# Patient Record
Sex: Female | Born: 1955 | Race: White | Hispanic: No | Marital: Married | Smoking: Never smoker
Health system: Southern US, Community
[De-identification: ages and names within clinical notes are randomized; demographics above are authoritative.]

## PROBLEM LIST (undated history)

## (undated) DIAGNOSIS — F419 Anxiety disorder, unspecified: Secondary | ICD-10-CM

## (undated) DIAGNOSIS — I10 Essential (primary) hypertension: Secondary | ICD-10-CM

## (undated) DIAGNOSIS — C50919 Malignant neoplasm of unspecified site of unspecified female breast: Secondary | ICD-10-CM

## (undated) DIAGNOSIS — F32A Depression, unspecified: Secondary | ICD-10-CM

## (undated) DIAGNOSIS — M069 Rheumatoid arthritis, unspecified: Secondary | ICD-10-CM

## (undated) DIAGNOSIS — Z923 Personal history of irradiation: Secondary | ICD-10-CM

## (undated) HISTORY — DX: Personal history of irradiation: Z92.3

## (undated) HISTORY — DX: Depression, unspecified: F32.A

## (undated) HISTORY — PX: HAND SURGERY: SHX662

## (undated) HISTORY — PX: LASIK: SHX215

## (undated) HISTORY — DX: Malignant neoplasm of unspecified site of unspecified female breast: C50.919

## (undated) HISTORY — PX: CATARACT EXTRACTION, BILATERAL: SHX1313

## (undated) HISTORY — DX: Rheumatoid arthritis, unspecified: M06.9

## (undated) HISTORY — PX: GASTRIC BYPASS: SHX52

---

## 2021-09-25 ENCOUNTER — Telehealth: Payer: Self-pay | Admitting: Family Medicine

## 2021-09-25 NOTE — Telephone Encounter (Signed)
Pt called stating that they had been recommended by Audrea Muscat and Loraine Leriche to see about becoming a pt of Dr. Nani Ravens. Pt was advised that approval would have to be submitted in order to approve establishment of care. Pt acknowledged understanding.   Is establishment of care ok for this pt?

## 2021-10-15 ENCOUNTER — Observation Stay (HOSPITAL_BASED_OUTPATIENT_CLINIC_OR_DEPARTMENT_OTHER)
Admission: EM | Admit: 2021-10-15 | Discharge: 2021-10-17 | Disposition: A | Discharge status: Home / Self Care | Payer: Medicare HMO | Attending: Surgery | Admitting: Surgery

## 2021-10-15 ENCOUNTER — Emergency Department (HOSPITAL_BASED_OUTPATIENT_CLINIC_OR_DEPARTMENT_OTHER): Payer: Medicare HMO

## 2021-10-15 ENCOUNTER — Other Ambulatory Visit: Payer: Self-pay

## 2021-10-15 ENCOUNTER — Encounter (HOSPITAL_BASED_OUTPATIENT_CLINIC_OR_DEPARTMENT_OTHER): Payer: Self-pay | Admitting: Emergency Medicine

## 2021-10-15 DIAGNOSIS — J479 Bronchiectasis, uncomplicated: Secondary | ICD-10-CM | POA: Diagnosis not present

## 2021-10-15 DIAGNOSIS — J90 Pleural effusion, not elsewhere classified: Secondary | ICD-10-CM | POA: Diagnosis not present

## 2021-10-15 DIAGNOSIS — J939 Pneumothorax, unspecified: Secondary | ICD-10-CM | POA: Diagnosis not present

## 2021-10-15 DIAGNOSIS — I10 Essential (primary) hypertension: Secondary | ICD-10-CM | POA: Diagnosis not present

## 2021-10-15 DIAGNOSIS — S2249XA Multiple fractures of ribs, unspecified side, initial encounter for closed fracture: Secondary | ICD-10-CM | POA: Diagnosis present

## 2021-10-15 DIAGNOSIS — J984 Other disorders of lung: Secondary | ICD-10-CM | POA: Diagnosis not present

## 2021-10-15 DIAGNOSIS — S2241XA Multiple fractures of ribs, right side, initial encounter for closed fracture: Principal | ICD-10-CM | POA: Insufficient documentation

## 2021-10-15 DIAGNOSIS — S27321A Contusion of lung, unilateral, initial encounter: Secondary | ICD-10-CM | POA: Diagnosis not present

## 2021-10-15 DIAGNOSIS — S22028A Other fracture of second thoracic vertebra, initial encounter for closed fracture: Secondary | ICD-10-CM | POA: Insufficient documentation

## 2021-10-15 DIAGNOSIS — W109XXA Fall (on) (from) unspecified stairs and steps, initial encounter: Secondary | ICD-10-CM | POA: Diagnosis not present

## 2021-10-15 DIAGNOSIS — I700 Atherosclerosis of aorta: Secondary | ICD-10-CM | POA: Diagnosis not present

## 2021-10-15 DIAGNOSIS — S299XXA Unspecified injury of thorax, initial encounter: Secondary | ICD-10-CM | POA: Diagnosis present

## 2021-10-15 HISTORY — DX: Essential (primary) hypertension: I10

## 2021-10-15 LAB — CBC WITH DIFFERENTIAL/PLATELET
Abs Immature Granulocytes: 0.04 10*3/uL (ref 0.00–0.07)
Basophils Absolute: 0 10*3/uL (ref 0.0–0.1)
Basophils Relative: 0 %
Eosinophils Absolute: 0 10*3/uL (ref 0.0–0.5)
Eosinophils Relative: 0 %
HCT: 42.8 % (ref 36.0–46.0)
Hemoglobin: 13.9 g/dL (ref 12.0–15.0)
Immature Granulocytes: 0 %
Lymphocytes Relative: 6 %
Lymphs Abs: 0.6 10*3/uL — ABNORMAL LOW (ref 0.7–4.0)
MCH: 27.6 pg (ref 26.0–34.0)
MCHC: 32.5 g/dL (ref 30.0–36.0)
MCV: 85.1 fL (ref 80.0–100.0)
Monocytes Absolute: 0.6 10*3/uL (ref 0.1–1.0)
Monocytes Relative: 6 %
Neutro Abs: 8.8 10*3/uL — ABNORMAL HIGH (ref 1.7–7.7)
Neutrophils Relative %: 88 %
Platelets: 311 10*3/uL (ref 150–400)
RBC: 5.03 MIL/uL (ref 3.87–5.11)
RDW: 15.9 % — ABNORMAL HIGH (ref 11.5–15.5)
WBC: 10.1 10*3/uL (ref 4.0–10.5)
nRBC: 0 % (ref 0.0–0.2)

## 2021-10-15 LAB — BASIC METABOLIC PANEL
Anion gap: 11 (ref 5–15)
BUN: 12 mg/dL (ref 8–23)
CO2: 21 mmol/L — ABNORMAL LOW (ref 22–32)
Calcium: 10.2 mg/dL (ref 8.9–10.3)
Chloride: 104 mmol/L (ref 98–111)
Creatinine, Ser: 0.58 mg/dL (ref 0.44–1.00)
GFR, Estimated: >60 mL/min (ref >60)
Glucose, Bld: 125 mg/dL — ABNORMAL HIGH (ref 70–99)
Potassium: 4.7 mmol/L (ref 3.5–5.1)
Sodium: 136 mmol/L (ref 135–145)

## 2021-10-15 MED ORDER — MELATONIN 3 MG PO TABS
3.0000 mg | ORAL_TABLET | Freq: Every evening | ORAL | Status: DC | PRN
  Filled 2021-10-15: qty 1

## 2021-10-15 MED ORDER — ENOXAPARIN SODIUM 30 MG/0.3ML IJ SOSY
30.0000 mg | PREFILLED_SYRINGE | Freq: Two times a day (BID) | INTRAMUSCULAR | Status: DC
  Administered 2021-10-16 – 2021-10-17 (×3): 30 mg via SUBCUTANEOUS
  Filled 2021-10-15 (×3): qty 0.3

## 2021-10-15 MED ORDER — HYDRALAZINE HCL 20 MG/ML IJ SOLN: 10.0000 mg | INTRAMUSCULAR | Status: DC | PRN

## 2021-10-15 MED ORDER — OXYCODONE HCL 5 MG PO TABS
5.0000 mg | ORAL_TABLET | ORAL | Status: DC | PRN
  Administered 2021-10-15 (×2): 10 mg via ORAL
  Administered 2021-10-16: 5 mg via ORAL
  Administered 2021-10-16: 10 mg via ORAL
  Administered 2021-10-16: 5 mg via ORAL
  Filled 2021-10-15 (×2): qty 2
  Filled 2021-10-15: qty 1
  Filled 2021-10-15 (×2): qty 2

## 2021-10-15 MED ORDER — SODIUM CHLORIDE 0.9 % IV SOLN: INTRAVENOUS | Status: DC

## 2021-10-15 MED ORDER — ONDANSETRON HCL 4 MG/2ML IJ SOLN: 4.0000 mg | Freq: Four times a day (QID) | INTRAMUSCULAR | Status: DC | PRN

## 2021-10-15 MED ORDER — DOCUSATE SODIUM 100 MG PO CAPS
100.0000 mg | ORAL_CAPSULE | Freq: Two times a day (BID) | ORAL | Status: DC
  Administered 2021-10-15 – 2021-10-17 (×4): 100 mg via ORAL
  Filled 2021-10-15 (×4): qty 1

## 2021-10-15 MED ORDER — ONDANSETRON 4 MG PO TBDP: 4.0000 mg | ORAL_TABLET | Freq: Four times a day (QID) | ORAL | Status: DC | PRN

## 2021-10-15 MED ORDER — ADULT MULTIVITAMIN W/MINERALS CH
1.0000 | ORAL_TABLET | Freq: Every day | ORAL | Status: DC
  Administered 2021-10-16 – 2021-10-17 (×2): 1 via ORAL
  Filled 2021-10-15 (×2): qty 1

## 2021-10-15 MED ORDER — ACETAMINOPHEN 325 MG PO TABS: 650.0000 mg | ORAL_TABLET | ORAL | Status: DC | PRN

## 2021-10-15 MED ORDER — HYDROCODONE-ACETAMINOPHEN 7.5-325 MG/15ML PO SOLN
10.0000 mL | Freq: Once | ORAL | Status: AC
  Administered 2021-10-15: 10 mL via ORAL
  Filled 2021-10-15: qty 15

## 2021-10-15 MED ORDER — MORPHINE SULFATE (PF) 2 MG/ML IV SOLN
2.0000 mg | INTRAVENOUS | Status: DC | PRN
  Administered 2021-10-15 (×3): 2 mg via INTRAVENOUS
  Filled 2021-10-15 (×3): qty 1

## 2021-10-15 NOTE — ED Notes (Signed)
States she feels better after PO pain med, but still rates pain a 7 on 0-10 scale, safety measures in place, family at bedside

## 2021-10-15 NOTE — ED Notes (Signed)
Returns from radiology, comfort measures provided, pt positioned for comfort, warm blanket to pain area also provided. Safety measures in place

## 2021-10-15 NOTE — ED Provider Notes (Addendum)
[facility] Provider Note   CSN: [id number] Arrival date & time: 10/15/21  0901     History  Chief Complaint  Patient presents with   Linda Wood is a 66 y.o. female.  Presented to emergency room due to concern for fall.  Patient reports that last night around 2 AM she fell.  She had recently walked down steps and fell, denies passing out, denies hitting her head.  Struck the right side of her chest.  Is having significant pain on the right side of her ribs, worse with deep inspiration.  Does feel somewhat short of breath.  No alleviating or aggravating factors otherwise.  She denies drinking alcohol on a daily basis.  Not on blood thinners.  HPI     Home Medications Prior to Admission medications   Not on File      Allergies    Patient has no allergy information on record.    Review of Systems   Review of Systems  Constitutional:  Negative for chills and fever.  HENT:  Negative for ear pain and sore throat.   Eyes:  Negative for pain and visual disturbance.  Respiratory:  Positive for chest tightness and shortness of breath. Negative for cough.   Cardiovascular:  Positive for chest pain. Negative for palpitations.  Gastrointestinal:  Negative for abdominal pain and vomiting.  Genitourinary:  Negative for dysuria and hematuria.  Musculoskeletal:  Negative for arthralgias and back pain.  Skin:  Negative for color change and rash.  Neurological:  Negative for seizures and syncope.  All other systems reviewed and are negative.   Physical Exam Updated Vital Signs BP (!) 159/92 (BP Location: Right Arm)   Pulse 78   Temp 97.6 F (36.4 C) (Oral)   Resp 20   Ht '5\' 2"'$  (1.575 m)   Wt 70.3 kg   SpO2 98%   BMI 28.35 kg/m  Physical Exam Vitals and nursing note reviewed.  Constitutional:      General: She is not in acute distress.    Appearance: She is well-developed.  HENT:     Head: Normocephalic and atraumatic.  Eyes:      Conjunctiva/sclera: Conjunctivae normal.  Cardiovascular:     Rate and Rhythm: Normal rate and regular rhythm.     Heart sounds: No murmur heard. Pulmonary:     Comments: Breath sounds are clear, patient does have mild tachypnea and slight increased work of breathing Chest:     Comments: Tenderness to palpation over right lateral and right posterior chest wall Abdominal:     Palpations: Abdomen is soft.     Tenderness: There is no abdominal tenderness.  Musculoskeletal:        General: No swelling.     Cervical back: Neck supple.     Comments: Back: no C, T, L spine TTP, no step off or deformity RUE: no TTP throughout, no deformity, normal joint ROM, radial pulse intact, distal sensation and motor intact LUE: no TTP throughout, no deformity, normal joint ROM, radial pulse intact, distal sensation and motor intact RLE:  no TTP throughout, no deformity, normal joint ROM, distal pulse, sensation and motor intact LLE: no TTP throughout, no deformity, normal joint ROM, distal pulse, sensation and motor intact  Skin:    General: Skin is warm and dry.     Capillary Refill: Capillary refill takes less than 2 seconds.  Neurological:     Mental Status: She is alert.  Psychiatric:  Mood and Affect: Mood normal.     ED Results / Procedures / Treatments   Labs (all labs ordered are listed, but only abnormal results are displayed) Labs Reviewed  CBC WITH DIFFERENTIAL/PLATELET - Abnormal; Notable for the following components:      Result Value   RDW 15.9 (*)    Neutro Abs 8.8 (*)    Lymphs Abs 0.6 (*)    All other components within normal limits  BASIC METABOLIC PANEL    EKG None  Radiology CT Chest Wo Contrast  Result Date: 10/15/2021 CLINICAL DATA:  Rib fracture suspected.  Patient fell today. EXAM: CT CHEST WITHOUT CONTRAST TECHNIQUE: Multidetector CT imaging of the chest was performed following the standard protocol without IV contrast. RADIATION DOSE REDUCTION: This exam  was performed according to the departmental dose-optimization program which includes automated exposure control, adjustment of the mA and/or kV according to patient size and/or use of iterative reconstruction technique. COMPARISON:  10/15/2021 FINDINGS: Cardiovascular: Heart size is normal. There is atherosclerotic calcification of the thoracic aorta, not associated with aneurysm. Coronary artery calcifications are present. Noncontrast appearance of the pulmonary artery is normal. Mediastinum/Nodes: Small hiatal hernia is present. The visualized portion of the thyroid gland has a normal appearance. No significant mediastinal, hilar, or axillary adenopathy. Lungs/Pleura: Trace pneumothorax on the RIGHT. There are infiltrates in the RIGHT LOWER lobe and RIGHT UPPER lobe, in a peribronchovascular distribution. Focal areas of bronchiectasis in the LOWER lobes bilaterally. Predominantly in the LOWER lobes. There is perihilar peribronchial thickening. 1 Upper Abdomen: The gallbladder is present. Postoperative changes in the gastroesophageal region. No acute abnormality. Musculoskeletal: There are acute fractures of RIGHT ribs 3 through 8. Many of these fractures are segmental. With significant chest wall deformity, associated pleural thickening, and trace pneumothorax. LEFT ribs are unremarkable. Sternum is intact. There is a superior endplate fracture of T2, of indeterminate age. No retropulsion of fracture fragments. Clavicles are intact. IMPRESSION: 1. Small RIGHT pneumothorax associated with acute fractures of the RIGHT LATERAL ribs 3 through 8. Many of these rib fractures are segmental. 2. Superior endplate fracture of T2, of indeterminate age not associated retropulsion. 3. Pulmonary infiltrates in the RIGHT UPPER lobe and RIGHT LOWER lobe, consistent with infectious process or contusion. Infectious process is favored. 4. Small RIGHT pleural effusion. 5.  Aortic atherosclerosis.  (ICD10-I70.0) 6. Coronary artery  calcifications. 7. Small hiatal hernia. 8. Postoperative changes in the gastroesophageal junction. Electronically Signed   By: Nolon Nations M.D.   On: 10/15/2021 10:41   DG Ribs Unilateral W/Chest Right  Result Date: 10/15/2021 CLINICAL DATA:  Right rib pain EXAM: RIGHT RIBS AND CHEST - 3+ VIEW COMPARISON:  None Available. FINDINGS: Multiple displaced right posterolateral rib fractures. No definite pneumothorax. Patchy density in the right lung may reflect atelectasis and/or contusion. Left lung is clear. Cardiomediastinal contours are within normal limits. IMPRESSION: Multiple displaced right posterolateral rib fractures. No definite pneumothorax. Patchy right lung atelectasis and/or contusion. Electronically Signed   By: Macy Mis M.D.   On: 10/15/2021 10:01    Procedures .Critical Care  Performed by: Lucrezia Starch, MD Authorized by: Lucrezia Starch, MD   Critical care provider statement:    Critical care time (minutes):  37   Critical care was necessary to treat or prevent imminent or life-threatening deterioration of the following conditions:  Respiratory failure and trauma   Critical care was time spent personally by me on the following activities:  Development of treatment plan with patient or surrogate,  discussions with consultants, evaluation of patient's response to treatment, examination of patient, ordering and review of laboratory studies, ordering and review of radiographic studies, ordering and performing treatments and interventions, pulse oximetry, re-evaluation of patient's condition and review of old charts     Medications Ordered in ED Medications  HYDROcodone-acetaminophen (HYCET) 7.5-325 mg/15 ml solution 10 mL (10 mLs Oral Given 10/15/21 5397)    ED Course/ Medical Decision Making/ A&P                           Medical Decision Making Amount and/or Complexity of Data Reviewed Labs: ordered. Radiology: ordered.  Risk Prescription drug  management. Decision regarding hospitalization.   66 year old lady presenting to ER after reported mechanical fall and right rib pain.  On physical exam she appears well but did have some tachypnea on initial exam, significant tenderness to right chest wall.   Patient denied head trauma, LOC, neck pain, low back pain, abdominal pain, extremity injury.  X-ray chest was independently reviewed and interpreted by myself, multiple displaced rib fractures noted.  Check CT chest to further evaluate. Independently reviewed and interpreted CT. Right 3 through 8 rib fractures noted, very small pneumothorax.  Radiologist commented on pulmonary infiltrate concerning for contusion versus infectious process.  Patient does not have cough, fever, leukocytosis, given recent trauma I have a much stronger clinical suspicion for contusion and will hold on starting antibiotics for now.  Will consult trauma surgery to request admission for further management.  I provided pain medication.   Discussed with Bobbye Morton - she will review chart/imaging and get back to me.          Final Clinical Impression(s) / ED Diagnoses Final diagnoses:  Closed fracture of multiple ribs of right side, initial encounter  Pneumothorax on right  Contusion of right lung, initial encounter    Rx / DC Orders ED Discharge Orders     None         Lucrezia Starch, MD 10/15/21 1058    Lucrezia Starch, MD 10/15/21 1105

## 2021-10-15 NOTE — ED Notes (Signed)
Pt endorses pain with deep inspiration

## 2021-10-15 NOTE — Progress Notes (Signed)
RT note- Incentive spirometer instructed. 333m best effort, reporting pain level of 8, splinting, xray pending.

## 2021-10-15 NOTE — Progress Notes (Signed)
RT note-Oxygen started at 2L min for sp02 <92% per MD

## 2021-10-15 NOTE — ED Notes (Addendum)
Attempted to call report to [facility] assigned bed. RN unable to take report at this time. RN sent secure chat report to IP RN.

## 2021-10-15 NOTE — ED Notes (Signed)
Ont to be HIGH FALL RISK client secondary to opioid administration for pain management, safety measures remain in place, again pt reminded to call for any assistance and not to get off stretcher without staff in room . Sr x 2 up, call bell within easy reach. Cont to await for room assignment at [facility]

## 2021-10-15 NOTE — ED Notes (Signed)
Denies hitting head, no LOC, GCS 15 upon arrival to ED

## 2021-10-15 NOTE — ED Notes (Addendum)
States she fell this am onto hardwood floor, rubbing area ar RUQ, no bruising or discoloration of area is noted at this time. Very tender to palpation. Pt was medicated per EDP orders, RT at bedside to instruct on IS device. Resp with slight tachypnea is noted. BS clear bilaterally

## 2021-10-15 NOTE — ED Notes (Signed)
Medicated per MD orders for pain (morphine '2mg'$  IV), given slow IV push, safety measures in place, call bell within easy reach, bed in lowest position, pt now a HIGH FALL RISK client) Placed on 5 lead cardiac monitoring as well

## 2021-10-15 NOTE — ED Triage Notes (Signed)
Pt arrives pov, to triage in wheelchair, c/o mechanical fall, etoh was involved. Pt reports Right side rib pain. Denies loc, or thinners, denies head injury

## 2021-10-15 NOTE — ED Notes (Signed)
Ambulated to restroom with min assistance, noted to have DOE upon return to exam room, also pain has increased to 8 on a 0-10 scale, pain med orders reviewed and pain med administered per PRN orders

## 2021-10-15 NOTE — ED Notes (Signed)
Patient transported to CT 

## 2021-10-16 ENCOUNTER — Observation Stay (HOSPITAL_COMMUNITY): Payer: Medicare HMO

## 2021-10-16 DIAGNOSIS — J939 Pneumothorax, unspecified: Secondary | ICD-10-CM | POA: Diagnosis not present

## 2021-10-16 DIAGNOSIS — S270XXA Traumatic pneumothorax, initial encounter: Secondary | ICD-10-CM | POA: Diagnosis not present

## 2021-10-16 DIAGNOSIS — J9811 Atelectasis: Secondary | ICD-10-CM | POA: Diagnosis not present

## 2021-10-16 DIAGNOSIS — S2241XA Multiple fractures of ribs, right side, initial encounter for closed fracture: Secondary | ICD-10-CM | POA: Diagnosis not present

## 2021-10-16 DIAGNOSIS — I10 Essential (primary) hypertension: Secondary | ICD-10-CM | POA: Diagnosis not present

## 2021-10-16 LAB — CBC
HCT: 38.2 % (ref 36.0–46.0)
Hemoglobin: 12.2 g/dL (ref 12.0–15.0)
MCH: 27.3 pg (ref 26.0–34.0)
MCHC: 31.9 g/dL (ref 30.0–36.0)
MCV: 85.5 fL (ref 80.0–100.0)
Platelets: 268 10*3/uL (ref 150–400)
RBC: 4.47 MIL/uL (ref 3.87–5.11)
RDW: 15.9 % — ABNORMAL HIGH (ref 11.5–15.5)
WBC: 7.9 10*3/uL (ref 4.0–10.5)
nRBC: 0 % (ref 0.0–0.2)

## 2021-10-16 LAB — HIV ANTIBODY (ROUTINE TESTING W REFLEX): HIV Screen 4th Generation wRfx: NONREACTIVE

## 2021-10-16 LAB — BASIC METABOLIC PANEL
Anion gap: 7 (ref 5–15)
BUN: 7 mg/dL — ABNORMAL LOW (ref 8–23)
CO2: 25 mmol/L (ref 22–32)
Calcium: 9.7 mg/dL (ref 8.9–10.3)
Chloride: 104 mmol/L (ref 98–111)
Creatinine, Ser: 0.48 mg/dL (ref 0.44–1.00)
GFR, Estimated: >60 mL/min (ref >60)
Glucose, Bld: 103 mg/dL — ABNORMAL HIGH (ref 70–99)
Potassium: 4 mmol/L (ref 3.5–5.1)
Sodium: 136 mmol/L (ref 135–145)

## 2021-10-16 MED ORDER — SODIUM CHLORIDE 0.9% FLUSH
3.0000 mL | Freq: Two times a day (BID) | INTRAVENOUS | Status: DC
  Administered 2021-10-16 – 2021-10-17 (×3): 3 mL via INTRAVENOUS

## 2021-10-16 MED ORDER — LOSARTAN POTASSIUM 50 MG PO TABS: 50.0000 mg | ORAL_TABLET | Freq: Every morning | ORAL | Status: DC

## 2021-10-16 MED ORDER — LIDOCAINE 5 % EX PTCH
1.0000 | MEDICATED_PATCH | CUTANEOUS | Status: DC
  Administered 2021-10-16 – 2021-10-17 (×2): 1 via TRANSDERMAL
  Filled 2021-10-16 (×2): qty 1

## 2021-10-16 MED ORDER — ACETAMINOPHEN 325 MG PO TABS
650.0000 mg | ORAL_TABLET | Freq: Four times a day (QID) | ORAL | Status: DC
  Administered 2021-10-16: 650 mg via ORAL
  Filled 2021-10-16: qty 2

## 2021-10-16 MED ORDER — ERGOCALCIFEROL 1.25 MG (50000 UT) PO CAPS
50000.0000 [IU] | ORAL_CAPSULE | ORAL | Status: DC
Start: 2021-10-20 — End: 2021-10-16

## 2021-10-16 MED ORDER — OXYCODONE HCL 5 MG/5ML PO SOLN
5.0000 mg | ORAL | Status: DC | PRN
  Administered 2021-10-16 – 2021-10-17 (×4): 10 mg via ORAL
  Filled 2021-10-16 (×4): qty 10

## 2021-10-16 MED ORDER — LOSARTAN POTASSIUM 50 MG PO TABS
50.0000 mg | ORAL_TABLET | Freq: Every day | ORAL | Status: DC
Start: 2021-10-16 — End: 2021-10-17
  Administered 2021-10-16 – 2021-10-17 (×2): 50 mg via ORAL
  Filled 2021-10-16 (×2): qty 1

## 2021-10-16 MED ORDER — IPRATROPIUM-ALBUTEROL 0.5-2.5 (3) MG/3ML IN SOLN
3.0000 mL | Freq: Two times a day (BID) | RESPIRATORY_TRACT | Status: DC
Start: 2021-10-16 — End: 2021-10-16

## 2021-10-16 MED ORDER — METHOCARBAMOL 500 MG PO TABS
1000.0000 mg | ORAL_TABLET | Freq: Three times a day (TID) | ORAL | Status: DC
  Administered 2021-10-16 – 2021-10-17 (×4): 1000 mg via ORAL
  Filled 2021-10-16 (×4): qty 2

## 2021-10-16 MED ORDER — MORPHINE SULFATE (PF) 2 MG/ML IV SOLN: 2.0000 mg | INTRAVENOUS | Status: DC | PRN

## 2021-10-16 MED ORDER — ESCITALOPRAM OXALATE 10 MG PO TABS
20.0000 mg | ORAL_TABLET | Freq: Every day | ORAL | Status: DC
  Administered 2021-10-16 – 2021-10-17 (×2): 20 mg via ORAL
  Filled 2021-10-16 (×2): qty 2

## 2021-10-16 MED ORDER — SODIUM CHLORIDE 0.9% FLUSH: 3.0000 mL | INTRAVENOUS | Status: DC | PRN

## 2021-10-16 MED ORDER — KETOROLAC TROMETHAMINE 15 MG/ML IJ SOLN
15.0000 mg | Freq: Three times a day (TID) | INTRAMUSCULAR | Status: DC
  Administered 2021-10-16 – 2021-10-17 (×4): 15 mg via INTRAVENOUS
  Filled 2021-10-16 (×5): qty 1

## 2021-10-16 MED ORDER — VITAMIN D (ERGOCALCIFEROL) 1.25 MG (50000 UNIT) PO CAPS: 50000.0000 [IU] | ORAL_CAPSULE | ORAL | Status: DC

## 2021-10-16 MED ORDER — HYDROXYCHLOROQUINE SULFATE 200 MG PO TABS
200.0000 mg | ORAL_TABLET | Freq: Every day | ORAL | Status: DC
  Administered 2021-10-16 – 2021-10-17 (×2): 200 mg via ORAL
  Filled 2021-10-16 (×2): qty 1

## 2021-10-16 MED ORDER — SODIUM CHLORIDE 0.9 % IV SOLN: 250.0000 mL | INTRAVENOUS | Status: DC | PRN

## 2021-10-16 MED ORDER — IPRATROPIUM-ALBUTEROL 0.5-2.5 (3) MG/3ML IN SOLN
3.0000 mL | Freq: Four times a day (QID) | RESPIRATORY_TRACT | Status: DC | PRN
  Administered 2021-10-17: 3 mL via RESPIRATORY_TRACT
  Filled 2021-10-16: qty 3

## 2021-10-16 MED ORDER — ACETAMINOPHEN 160 MG/5ML PO SOLN
650.0000 mg | Freq: Four times a day (QID) | ORAL | Status: DC
  Administered 2021-10-16 – 2021-10-17 (×4): 650 mg via ORAL
  Filled 2021-10-16 (×4): qty 20.3

## 2021-10-16 NOTE — Evaluation (Signed)
Occupational Therapy Evaluation Patient Details Name: Linda Wood MRN: [id number] DOB: [birth year] Today's Date: 10/16/2021   History of Present Illness Patient is a 66 y/o female who presents on 6/19 with right sided pain s/p fall, + ETOH. Found to have Rt 3-8 rib fractures and very small PTX. PMH includes HTN.   Clinical Impression   Pt admitted with the above diagnosis and has the deficits listed below. Pt would benefit from one or two more sessions to increase stability on feet during adls and increase ability to do adls that require reaching. Pt may benefit from an abdominal binder for pain control due to sheer number of rib fractures.  Will continue to see acutely but most likely will not need post acute OT.       Recommendations for follow up therapy are one component of a multi-disciplinary discharge planning process, led by the attending physician.  Recommendations may be updated based on patient status, additional functional criteria and insurance authorization.   Follow Up Recommendations  No OT follow up    Assistance Recommended at Discharge Intermittent Supervision/Assistance  Patient can return home with the following A little help with walking and/or transfers;A little help with bathing/dressing/bathroom;Assistance with cooking/housework;Assist for transportation;Help with stairs or ramp for entrance    Functional Status Assessment  Patient has had a recent decline in their functional status and demonstrates the ability to make significant improvements in function in a reasonable and predictable amount of time.  Equipment Recommendations  None recommended by OT    Recommendations for Other Services       Precautions / Restrictions Precautions Precautions: Fall;Other (comment) Precaution Comments: watch 02 Restrictions Weight Bearing Restrictions: No      Mobility Bed Mobility               General bed mobility comments: pt in chair on arrival     Transfers Overall transfer level: Needs assistance Equipment used: None Transfers: Sit to/from Stand Sit to Stand: Min guard           General transfer comment: Min guard for safety, Stood from EOB x1, from chair x1, transferred to recliner post ambulation.      Balance Overall balance assessment: Needs assistance Sitting-balance support: Feet supported, No upper extremity supported Sitting balance-Leahy Scale: Good     Standing balance support: During functional activity Standing balance-Leahy Scale: Fair Standing balance comment: Min guard for safety. Min A needed x1 due to LOB with walking                           ADL either performed or assessed with clinical judgement   ADL Overall ADL's : Needs assistance/impaired Eating/Feeding: Independent;Sitting   Grooming: Minimal assistance;Standing Grooming Details (indicate cue type and reason): assist combing hair. Difficult to get R arm overhead Upper Body Bathing: Set up;Sitting   Lower Body Bathing: Sit to/from stand;Supervison/ safety;Cueing for compensatory techniques Lower Body Bathing Details (indicate cue type and reason): cues of how to brace/splint ribs while moving to decrease pain Upper Body Dressing : Minimal assistance;Sitting Upper Body Dressing Details (indicate cue type and reason): technique reviewed on how to donn overhead shirt to keep pain down Lower Body Dressing: Minimal assistance;Sit to/from stand;Cueing for compensatory techniques   Toilet Transfer: Minimal assistance;Ambulation;Comfort height toilet;Grab bars   Toileting- Clothing Manipulation and Hygiene: Supervision/safety;Sit to/from stand       Functional mobility during ADLs: Minimal assistance General ADL Comments: Pt most limited adls  where arms go overhead and is slightly unsteady on feet when ambulating.     Vision Baseline Vision/History: 0 No visual deficits Ability to See in Adequate Light: 0 Adequate Patient  Visual Report: No change from baseline Vision Assessment?: No apparent visual deficits     Perception     Praxis      Pertinent Vitals/Pain Pain Assessment Pain Assessment: Faces Faces Pain Scale: Hurts even more Pain Location: right side Pain Descriptors / Indicators: Grimacing, Discomfort, Sore, Sharp Pain Intervention(s): Limited activity within patient's tolerance, Monitored during session, Repositioned, Relaxation     Hand Dominance Right   Extremity/Trunk Assessment Upper Extremity Assessment Upper Extremity Assessment: Overall WFL for tasks assessed   Lower Extremity Assessment Lower Extremity Assessment: Defer to PT evaluation RLE Deficits / Details: Swelling present - baseline LLE Deficits / Details: Reports she twisted her ankle during the fall but ankle AROM WFL with some discomfort. Swelling present which pt reports as baseline   Cervical / Trunk Assessment Cervical / Trunk Assessment: Other exceptions Cervical / Trunk Exceptions: rib fxs   Communication Communication Communication: No difficulties   Cognition Arousal/Alertness: Awake/alert Behavior During Therapy: WFL for tasks assessed/performed Overall Cognitive Status: Within Functional Limits for tasks assessed                                       General Comments  Pt most limited by pain. Pt educated re: incentive spirometer and has been using.  Pt O2 removed during session.  Sats dropped to 89% but recovered to 95% with 2L O2.    Exercises     Shoulder Instructions      Home Living Family/patient expects to be discharged to:: Private residence Living Arrangements: Spouse/significant other Available Help at Discharge: Family;Available 24 hours/day Type of Home: House Home Access: Stairs to enter [employer] of Steps: 3 Entrance Stairs-Rails: Right Home Layout: Two level;Able to live on main level with bedroom/bathroom Alternate Level Stairs-Number of Steps: 1  flight Alternate Level Stairs-Rails: Right Bathroom Shower/Tub: Occupational psychologist: Standard     Home Equipment: None   Additional Comments: Pt has craft room upstairs that ideally she would like to be able to get to.      Prior Functioning/Environment Prior Level of Function : Independent/Modified Independent             Mobility Comments: Independent, drives, does not work, Geophysicist/field seismologist to work on crafts, no falls ADLs Comments: independent        OT Problem List: Decreased activity tolerance;Cardiopulmonary status limiting activity;Pain      OT Treatment/Interventions: Self-care/ADL training;Therapeutic activities    OT Goals(Current goals can be found in the care plan section) Acute Rehab OT Goals Patient Stated Goal: to go home OT Goal Formulation: With patient Time For Goal Achievement: 10/23/21 Potential to Achieve Goals: Good ADL Goals Pt Will Perform Grooming: with modified independence;standing Pt Will Perform Tub/Shower Transfer: Shower transfer;with modified independence;ambulating Additional ADL Goal #1: Pt will walk to bathroom and complete all toileting Ily. Additional ADL Goal #2: Pt will dress self Ily  OT Frequency: Min 2X/week    Co-evaluation              AM-PAC OT "6 Clicks" Daily Activity     Outcome Measure Help from another person eating meals?: None Help from another person taking care of personal grooming?: A Little Help from another  person toileting, which includes using toliet, bedpan, or urinal?: A Little Help from another person bathing (including washing, rinsing, drying)?: A Little Help from another person to put on and taking off regular upper body clothing?: A Little Help from another person to put on and taking off regular lower body clothing?: A Little 6 Click Score: 19   End of Session Equipment Utilized During Treatment: Oxygen Nurse Communication: Mobility status  Activity Tolerance: Patient tolerated  treatment well Patient left: in chair;with call bell/phone within reach;with chair alarm set  OT Visit Diagnosis: Unsteadiness on feet (R26.81)                Time: 0122-2411 OT Time Calculation (min): 22 min Charges:  OT General Charges $OT Visit: 1 Visit OT Evaluation $OT Eval Low Complexity: 1 Low  Glenford Peers 10/16/2021, 10:54 AM

## 2021-10-16 NOTE — Care Management Obs Status (Signed)
[place] NOTIFICATION   Patient Details  Name: Linda Wood MRN: [id number] Date of Birth: [birth year]   Medicare Observation Status Notification Given:  Yes    Ella Bodo, RN 10/16/2021, 1:59 PM

## 2021-10-16 NOTE — Evaluation (Signed)
Physical Therapy Evaluation Patient Details Name: Linda Wood MRN: [id number] DOB: 1955-07-28 Today's Date: 10/16/2021  History of Present Illness  Patient is a 66 y/o female who presents on 6/19 with right sided pain s/p fall, + ETOH. Found to have Rt 3-8 rib fractures and very small PTX. PMH includes HTN.  Clinical Impression  Patient presents with pain, dyspnea on exertion, impaired balance, decreased endurance and impaired mobility s/p above. Pt is independent and lives with spouse PTA. Today, pt tolerated transfers, gait training and stair training with min guard-Min A for balance/safety. Noted to have 1 LOB with ambulation performing dynamic tasks but able to recover with Min A. NOted to have 2-3/4 DOE and requiring 1 seated rest break. Sp02 ranged from 81-92% on 1-2L/min 02 [place] with activity. Pt will have support from spouse at home. Encouraged increasing activity/walking to bathroom and using IS. Will follow acutely to maximize independence and mobility prior to return home.     Recommendations for follow up therapy are one component of a multi-disciplinary discharge planning process, led by the attending physician.  Recommendations may be updated based on patient status, additional functional criteria and insurance authorization.  Follow Up Recommendations No PT follow up    Assistance Recommended at Discharge PRN  Patient can return home with the following  Assistance with cooking/housework;Assist for transportation;Help with stairs or ramp for entrance;A little help with bathing/dressing/bathroom    Equipment Recommendations None recommended by PT  Recommendations for Other Services       Functional Status Assessment Patient has had a recent decline in their functional status and demonstrates the ability to make significant improvements in function in a reasonable and predictable amount of time.     Precautions / Restrictions Precautions Precautions: Fall;Other  (comment) Precaution Comments: watch 02 Restrictions Weight Bearing Restrictions: No      Mobility  Bed Mobility Overal bed mobility: Needs Assistance Bed Mobility: Rolling, Sidelying to Sit Rolling: Supervision Sidelying to sit: Supervision, HOB elevated       General bed mobility comments: HOB elevated slightly, cues for log roll technique and breathingt o assist with pain control.    Transfers Overall transfer level: Needs assistance Equipment used: None Transfers: Sit to/from Stand Sit to Stand: Min guard           General transfer comment: Min guard for safety, Stood from EOB x1, from chair x1, transferred to recliner post ambulation.    Ambulation/Gait Ambulation/Gait assistance: Min assist, Min guard Gait Distance (Feet): 150 Feet (+ 175') Assistive device: None Gait Pattern/deviations: Step-through pattern, Decreased stride length, Drifts right/left Gait velocity: decreased Gait velocity interpretation: <1.31 ft/sec, indicative of household ambulator   General Gait Details: Slow, mildly unsteady gait at times with 1 LOB needing MIn A to correct, 1 seated rest break. 2-3/4 DOE Sp02 dropped to 81% on 1L/min 02 [place], increased to 2L and able to maintain Sp02 >87% with activity.  Stairs Stairs: Yes Stairs assistance: Min guard, Min assist Stair Management: One rail Left, One rail Right, Step to pattern, Forwards, Alternating pattern Number of Stairs: 3 General stair comments: Attempted to ascend stairs on right but once she immediately stepped up, came back down due to weakness/pain, used LUE on left rail and able to negotiate with alternating stepping pattern to ascend and step to to descend steps with Min guard assist.  Wheelchair Mobility    Modified Rankin (Stroke Patients Only)       Balance Overall balance assessment: Needs assistance Sitting-balance support: Feet  supported, No upper extremity supported Sitting balance-Leahy Scale: Fair Sitting  balance - Comments: Prefers BUE support posteriorly as position of comfort due to pain   Standing balance support: During functional activity Standing balance-Leahy Scale: Fair Standing balance comment: Min guard for safety. Min A needed x1 due to LOB with walking                             Pertinent Vitals/Pain Pain Assessment Pain Assessment: Faces Faces Pain Scale: Hurts even more Pain Location: right side Pain Descriptors / Indicators: Grimacing, Discomfort, Sore, Sharp Pain Intervention(s): Premedicated before session, Monitored during session, Repositioned    Home Living Family/patient expects to be discharged to:: Private residence Living Arrangements: Spouse/significant other Available Help at Discharge: Family;Available 24 hours/day Type of Home: House Home Access: Stairs to enter Entrance Stairs-Rails: Right Entrance Stairs-Number of Steps: 3 Alternate Level Stairs-Number of Steps: 1 flight Home Layout: Two level;Able to live on main level with bedroom/bathroom Home Equipment: None      Prior Function Prior Level of Function : Independent/Modified Independent             Mobility Comments: Independent, drives, does not work, Geophysicist/field seismologist to work on crafts, no falls ADLs Comments: independent     Journalist, newspaper        Extremity/Trunk Assessment   Upper Extremity Assessment Upper Extremity Assessment: Defer to OT evaluation    Lower Extremity Assessment Lower Extremity Assessment: LLE deficits/detail;RLE deficits/detail RLE Deficits / Details: Swelling present - baseline LLE Deficits / Details: Reports she twisted her ankle during the fall but ankle AROM WFL with some discomfort. Swelling present which pt reports as baseline       Communication   Communication: No difficulties  Cognition Arousal/Alertness: Awake/alert Behavior During Therapy: WFL for tasks assessed/performed Overall Cognitive Status: Within Functional Limits for tasks  assessed                                          General Comments General comments (skin integrity, edema, etc.): Sp02 ranged from 81-92% on 1-2L/min 02 [place] with activity.    Exercises     Assessment/Plan    PT Assessment Patient needs continued PT services  PT Problem List Decreased strength;Decreased mobility;Pain;Decreased balance;Cardiopulmonary status limiting activity       PT Treatment Interventions Therapeutic activities;Gait training;Stair training;Functional mobility training;Balance training;Therapeutic exercise;Patient/family education    PT Goals (Current goals can be found in the Care Plan section)  Acute Rehab PT Goals Patient Stated Goal: decrease pain PT Goal Formulation: With patient Time For Goal Achievement: 10/30/21 Potential to Achieve Goals: Good    Frequency Min 4X/week     Co-evaluation               AM-PAC PT "6 Clicks" Mobility  Outcome Measure Help needed turning from your back to your side while in a flat bed without using bedrails?: A Little Help needed moving from lying on your back to sitting on the side of a flat bed without using bedrails?: A Little Help needed moving to and from a bed to a chair (including a wheelchair)?: A Little Help needed standing up from a chair using your arms (e.g., wheelchair or bedside chair)?: A Little Help needed to walk in hospital room?: A Little Help needed climbing 3-5 steps with a railing? : A Little  6 Click Score: 18    End of Session Equipment Utilized During Treatment: Oxygen;Gait belt Activity Tolerance: Treatment limited secondary to medical complications (Comment) (drop in Sp02) Patient left: in chair;with call bell/phone within reach;with chair alarm set Nurse Communication: Mobility status PT Visit Diagnosis: Pain;Muscle weakness (generalized) (M62.81);Unsteadiness on feet (R26.81) Pain - Right/Left: Right Pain - part of body:  (ribs)    Time: 3810-1751 PT Time  Calculation (min) (ACUTE ONLY): 28 min   Charges:   PT Evaluation $PT Eval Moderate Complexity: 1 Mod PT Treatments $Gait Training: 8-22 mins        Marisa Severin, PT, DPT Acute Rehabilitation Services Secure chat preferred Office [place] 10/16/2021, 9:21 AM

## 2021-10-16 NOTE — Progress Notes (Signed)
SATURATION QUALIFICATIONS: (This note is used to comply with regulatory documentation for home oxygen)  Patient Saturations on Room Air at Rest = 91%  Patient Saturations on Room Air while Ambulating = 83%  Patient Saturations on 2 Liters of oxygen while Ambulating = 89%  Please briefly explain why patient needs home oxygen: Pt requires supplemental 02 to maintain Sp02 >89% with activity.   Marisa Severin, PT, DPT Acute Rehabilitation Services Secure chat preferred Office [phone]

## 2021-10-16 NOTE — H&P (Signed)
Trauma Admission Note  Linda Wood 07/31/1955  [id number].    Requesting MD: Madalyn Rob, MD Chief Complaint/Reason for Consult: fall with rib fractures HPI:  Patient is a 66 year old female who [facility] around 2 AM yesterday. She had gone down some steps and fell landing on her right chest. Denied LOC or hitting head. Reported pain in right chest, worse with inspiration. Found to have right rib fractures with trace right PTX. Reportedly patient had been drinking prior to fall but denied daily alcohol use. PMH otherwise significant for HTN and depression. She lives at home with her husband, she also has a sister in law but she currently has a broken foot.   ROS: Negative other than HPI  History reviewed. No pertinent family history.  Past Medical History:  Diagnosis Date   Hypertension     Past Surgical History:  Procedure Laterality Date   GASTRIC BYPASS      Social History:  reports that she has never smoked. She has never used smokeless tobacco. She reports current drug use. No history on file for alcohol use.  Allergies: Not on File  No medications prior to admission.    Blood pressure (!) 151/78, pulse 87, temperature 98.4 F (36.9 C), resp. rate 20, height '5\' 2"'$  (1.575 m), weight 70.3 kg, SpO2 96 %. Physical Exam:  General: pleasant, WD, overweight female who is laying in bed and appears uncomfortable HEENT: head is normocephalic, atraumatic.  Sclera are noninjected.  Pupils equal and round.  Ears and nose without any masses or lesions.  Mouth is pink and moist Heart: regular, rate, and rhythm.  Normal s1,s2. No obvious murmurs, gallops, or rubs noted.  Palpable radial and pedal pulses bilaterally Lungs: CTAB, no wheezes, rhonchi, or rales noted.  Respiratory effort nonlabored Abd: soft, NT, ND, +BS, no masses, hernias, or organomegaly MS: all 4 extremities are symmetrical with no cyanosis, clubbing, or edema. Skin: warm and dry with  no masses, lesions, or rashes Neuro: Cranial nerves 2-12 grossly intact, sensation is normal throughout Psych: A&Ox3 with an appropriate affect.   Results for orders placed or performed during the hospital encounter of 10/15/21 (from the past 48 hour(s))  CBC with Differential     Status: Abnormal   Collection Time: 10/15/21 10:29 AM  Result Value Ref Range   WBC 10.1 4.0 - 10.5 K/uL   RBC 5.03 3.87 - 5.11 MIL/uL   Hemoglobin 13.9 12.0 - 15.0 g/dL   HCT 42.8 36.0 - 46.0 %   MCV 85.1 80.0 - 100.0 fL   MCH 27.6 26.0 - 34.0 pg   MCHC 32.5 30.0 - 36.0 g/dL   RDW 15.9 (H) 11.5 - 15.5 %   Platelets 311 150 - 400 K/uL   nRBC 0.0 0.0 - 0.2 %   Neutrophils Relative % 88 %   Neutro Abs 8.8 (H) 1.7 - 7.7 K/uL   Lymphocytes Relative 6 %   Lymphs Abs 0.6 (L) 0.7 - 4.0 K/uL   Monocytes Relative 6 %   Monocytes Absolute 0.6 0.1 - 1.0 K/uL   Eosinophils Relative 0 %   Eosinophils Absolute 0.0 0.0 - 0.5 K/uL   Basophils Relative 0 %   Basophils Absolute 0.0 0.0 - 0.1 K/uL   Immature Granulocytes 0 %   Abs Immature Granulocytes 0.04 0.00 - 0.07 K/uL    Comment: Performed at [facility], [address] [place], [place] [address]  Basic metabolic  panel     Status: Abnormal   Collection Time: 10/15/21 10:29 AM  Result Value Ref Range   Sodium 136 135 - 145 mmol/L   Potassium 4.7 3.5 - 5.1 mmol/L   Chloride 104 98 - 111 mmol/L   CO2 21 (L) 22 - 32 mmol/L   Glucose, Bld 125 (H) 70 - 99 mg/dL    Comment: Glucose reference range applies only to samples taken after fasting for at least 8 hours.   BUN 12 8 - 23 mg/dL   Creatinine, Ser 0.58 0.44 - 1.00 mg/dL   Calcium 10.2 8.9 - 10.3 mg/dL   GFR, Estimated >60 >60 mL/min    Comment: (NOTE) Calculated using the CKD-EPI Creatinine Equation (2021)    Anion gap 11 5 - 15    Comment: Performed at [facility], [address] [place], [place] [address]  CBC     Status: Abnormal   Collection Time: 10/16/21  3:42 AM  Result  Value Ref Range   WBC 7.9 4.0 - 10.5 K/uL   RBC 4.47 3.87 - 5.11 MIL/uL   Hemoglobin 12.2 12.0 - 15.0 g/dL   HCT 38.2 36.0 - 46.0 %   MCV 85.5 80.0 - 100.0 fL   MCH 27.3 26.0 - 34.0 pg   MCHC 31.9 30.0 - 36.0 g/dL   RDW 15.9 (H) 11.5 - 15.5 %   Platelets 268 150 - 400 K/uL   nRBC 0.0 0.0 - 0.2 %    Comment: Performed at [facility], [place] [address] [place], [place] [address]  Basic metabolic panel     Status: Abnormal   Collection Time: 10/16/21  3:42 AM  Result Value Ref Range   Sodium 136 135 - 145 mmol/L   Potassium 4.0 3.5 - 5.1 mmol/L   Chloride 104 98 - 111 mmol/L   CO2 25 22 - 32 mmol/L   Glucose, Bld 103 (H) 70 - 99 mg/dL    Comment: Glucose reference range applies only to samples taken after fasting for at least 8 hours.   BUN 7 (L) 8 - 23 mg/dL   Creatinine, Ser 0.48 0.44 - 1.00 mg/dL   Calcium 9.7 8.9 - 10.3 mg/dL   GFR, Estimated >60 >60 mL/min    Comment: (NOTE) Calculated using the CKD-EPI Creatinine Equation (2021)    Anion gap 7 5 - 15    Comment: Performed at [facility] [address] [place], [place] [address]   DG Chest Port 1 View  Result Date: 10/16/2021 CLINICAL DATA:  Rib fractures.  Pneumothorax EXAM: PORTABLE CHEST 1 VIEW COMPARISON:  Chest CT from yesterday FINDINGS: Trace right apical pneumothorax. Extensive known rib fractures on the right. Increase in bandlike opacity at the bases. No large or definite increase in pleural fluid. Normal heart size and stable aortic tortuosity. IMPRESSION: 1. Unchanged trace right apical pneumothorax. 2. Increased atelectasis. Electronically Signed   By: Jorje Guild M.D.   On: 10/16/2021 06:34   CT Chest Wo Contrast  Result Date: 10/15/2021 CLINICAL DATA:  Rib fracture suspected.  Patient fell today. EXAM: CT CHEST WITHOUT CONTRAST TECHNIQUE: Multidetector CT imaging of the chest was performed following the standard protocol without IV contrast. RADIATION DOSE REDUCTION: This exam was performed  according to the departmental dose-optimization program which includes automated exposure control, adjustment of the mA and/or kV according to patient size and/or use of iterative reconstruction technique. COMPARISON:  10/15/2021 FINDINGS: Cardiovascular: Heart size is normal. There  is atherosclerotic calcification of the thoracic aorta, not associated with aneurysm. Coronary artery calcifications are present. Noncontrast appearance of the pulmonary artery is normal. Mediastinum/Nodes: Small hiatal hernia is present. The visualized portion of the thyroid gland has a normal appearance. No significant mediastinal, hilar, or axillary adenopathy. Lungs/Pleura: Trace pneumothorax on the RIGHT. There are infiltrates in the RIGHT LOWER lobe and RIGHT UPPER lobe, in a peribronchovascular distribution. Focal areas of bronchiectasis in the LOWER lobes bilaterally. Predominantly in the LOWER lobes. There is perihilar peribronchial thickening. 1 Upper Abdomen: The gallbladder is present. Postoperative changes in the gastroesophageal region. No acute abnormality. Musculoskeletal: There are acute fractures of RIGHT ribs 3 through 8. Many of these fractures are segmental. With significant chest wall deformity, associated pleural thickening, and trace pneumothorax. LEFT ribs are unremarkable. Sternum is intact. There is a superior endplate fracture of T2, of indeterminate age. No retropulsion of fracture fragments. Clavicles are intact. IMPRESSION: 1. Small RIGHT pneumothorax associated with acute fractures of the RIGHT LATERAL ribs 3 through 8. Many of these rib fractures are segmental. 2. Superior endplate fracture of T2, of indeterminate age not associated retropulsion. 3. Pulmonary infiltrates in the RIGHT UPPER lobe and RIGHT LOWER lobe, consistent with infectious process or contusion. Infectious process is favored. 4. Small RIGHT pleural effusion. 5.  Aortic atherosclerosis.  (ICD10-I70.0) 6. Coronary artery calcifications.  7. Small hiatal hernia. 8. Postoperative changes in the gastroesophageal junction. Electronically Signed   By: Nolon Nations M.D.   On: 10/15/2021 10:41   DG Ribs Unilateral W/Chest Right  Result Date: 10/15/2021 CLINICAL DATA:  Right rib pain EXAM: RIGHT RIBS AND CHEST - 3+ VIEW COMPARISON:  None Available. FINDINGS: Multiple displaced right posterolateral rib fractures. No definite pneumothorax. Patchy density in the right lung may reflect atelectasis and/or contusion. Left lung is clear. Cardiomediastinal contours are within normal limits. IMPRESSION: Multiple displaced right posterolateral rib fractures. No definite pneumothorax. Patchy right lung atelectasis and/or contusion. Electronically Signed   By: Macy Mis M.D.   On: 10/15/2021 10:01      Assessment/Plan Fall R3-8 rib fractures with trace R PTX - multimodal pain control, IS, pulm toilet, repeat CXR with stable trace right PTX ?chronic T2 endplate fracture - no current back pain, PT/OT HTN - home meds reordered Depression - home meds reordered  FEN: reg diet, SLIV VTE: LMWH ID: no current abx, no leukocytosis and afebrile   Dispo: Pain control, PT/OT. Possible DC later today vs tomorrow AM pending progress. Wean to room air as able.   I reviewed nursing notes, ED provider notes, last 24 h vitals and pain scores, last 48 h intake and output, last 24 h labs and trends, and last 24 h imaging results.    Norm Parcel, [facility] 10/16/2021, 7:38 AM Please see Amion for pager number during day hours 7:00am-4:30pm

## 2021-10-16 NOTE — TOC Initial Note (Addendum)
Transition of Care [facility]) - Initial/Assessment Note    Patient Details  Name: Linda Wood MRN: [id number] Date of Birth: 10-10-1955  Transition of Care [facility]) CM/SW Contact:    Ella Bodo, RN Phone Number: 10/16/2021, 4:14 PM  Clinical Narrative:                 Patient is a 66 y/o female who presents on 6/19 with right sided pain s/p fall, + ETOH. Found to have Rt 3-8 rib fractures and very small PTX.  PTA, pt independent and living at home with spouse, who is able to assist with care at dc.  PT/OT recommending no OP follow up or DME.  Will follow for home needs as patient progresses, though none anticipated. Pt's PCP is Dr. Riki Sheer.    Expected Discharge Plan: Home/Self Care Barriers to Discharge: Continued Medical Work up   Patient Goals and CMS Choice Patient states their goals for this hospitalization and ongoing recovery are:: to go home      Expected Discharge Plan and Services Expected Discharge Plan: Home/Self Care   Discharge Planning Services: CM Consult   Living arrangements for the past 2 months: Single Family Home                                      Prior Living Arrangements/Services Living arrangements for the past 2 months: Single Family Home Lives with:: Spouse Patient language and need for interpreter reviewed:: Yes Do you feel safe going back to the place where you live?: Yes      Need for Family Participation in Patient Care: Yes (Comment) Care giver support system in place?: Yes (comment)   Criminal Activity/Legal Involvement Pertinent to Current Situation/Hospitalization: No - Comment as needed                 Emotional Assessment Appearance:: Appears stated age Attitude/Demeanor/Rapport: Engaged Affect (typically observed): Accepting Orientation: : Oriented to Self, Oriented to Place, Oriented to  Time, Oriented to Situation      Admission diagnosis:  Rib fractures [S22.49XA] Pneumothorax on right  [J93.9] Contusion of right lung, initial encounter [I71.245Y] Closed fracture of multiple ribs of right side, initial encounter [S22.41XA] Patient Active Problem List   Diagnosis Date Noted   Rib fractures 10/15/2021   PCP:  No primary care provider on file. Pharmacy:   [facility] [address], [place] [address] Phone: [phone] Fax: [phone]  [id number] - [place], [address] AT [facility] OF [place] [address] [place] [address] Phone: [phone] Fax: [phone]  [facility] #[id number] - [place], [place] - [address] AT [place] [address] [place] [address] Phone: [phone] Fax: [phone]     Social Determinants of Health (SDOH) Interventions    Readmission Risk Interventions     No data to display         Reinaldo Raddle, RN, BSN  Trauma/Neuro ICU Case Manager [phone]

## 2021-10-17 ENCOUNTER — Observation Stay (HOSPITAL_COMMUNITY): Payer: Medicare HMO

## 2021-10-17 DIAGNOSIS — S270XXA Traumatic pneumothorax, initial encounter: Secondary | ICD-10-CM | POA: Diagnosis not present

## 2021-10-17 DIAGNOSIS — S2241XA Multiple fractures of ribs, right side, initial encounter for closed fracture: Secondary | ICD-10-CM | POA: Diagnosis not present

## 2021-10-17 DIAGNOSIS — J939 Pneumothorax, unspecified: Secondary | ICD-10-CM | POA: Diagnosis not present

## 2021-10-17 LAB — BASIC METABOLIC PANEL
Anion gap: 9 (ref 5–15)
BUN: 9 mg/dL (ref 8–23)
CO2: 25 mmol/L (ref 22–32)
Calcium: 9.8 mg/dL (ref 8.9–10.3)
Chloride: 104 mmol/L (ref 98–111)
Creatinine, Ser: 0.57 mg/dL (ref 0.44–1.00)
GFR, Estimated: >60 mL/min (ref >60)
Glucose, Bld: 110 mg/dL — ABNORMAL HIGH (ref 70–99)
Potassium: 4.1 mmol/L (ref 3.5–5.1)
Sodium: 138 mmol/L (ref 135–145)

## 2021-10-17 MED ORDER — OXYCODONE HCL 5 MG/5ML PO SOLN: 5.0000 mg | ORAL | 0 refills | Status: DC | PRN

## 2021-10-17 MED ORDER — LIDOCAINE 5 % EX PTCH: 1.0000 | MEDICATED_PATCH | CUTANEOUS | 0 refills | Status: DC

## 2021-10-17 MED ORDER — POLYETHYLENE GLYCOL 3350 17 G PO PACK: 17.0000 g | PACK | Freq: Every day | ORAL | 0 refills | Status: DC | PRN

## 2021-10-17 MED ORDER — ACETAMINOPHEN 160 MG/5ML PO SOLN
1000.0000 mg | Freq: Four times a day (QID) | ORAL | Status: DC
  Administered 2021-10-17: 1000 mg via ORAL
  Filled 2021-10-17: qty 40.6

## 2021-10-17 MED ORDER — METHOCARBAMOL 500 MG PO TABS
1000.0000 mg | ORAL_TABLET | Freq: Four times a day (QID) | ORAL | Status: DC
  Administered 2021-10-17: 1000 mg via ORAL
  Filled 2021-10-17: qty 2

## 2021-10-17 MED ORDER — BISACODYL 10 MG RE SUPP
10.0000 mg | Freq: Once | RECTAL | Status: AC
  Administered 2021-10-17: 10 mg via RECTAL
  Filled 2021-10-17: qty 1

## 2021-10-17 MED ORDER — POLYETHYLENE GLYCOL 3350 17 G PO PACK
17.0000 g | PACK | Freq: Every day | ORAL | Status: DC
  Administered 2021-10-17: 17 g via ORAL
  Filled 2021-10-17: qty 1

## 2021-10-17 MED ORDER — ACETAMINOPHEN 160 MG/5ML PO SOLN: 1000.0000 mg | Freq: Four times a day (QID) | ORAL | 0 refills | Status: DC | PRN

## 2021-10-17 MED ORDER — METHOCARBAMOL 500 MG PO TABS: 1000.0000 mg | ORAL_TABLET | Freq: Four times a day (QID) | ORAL | 0 refills | Status: DC | PRN

## 2021-10-17 NOTE — Discharge Summary (Cosign Needed)
[name]   Patient ID: Linda Wood MRN: [id number] DOB/AGE: 68/03/1956 68 y.o.  Admit date: 10/15/2021 Discharge date: 10/17/2021  Admitting Diagnosis: Rib fractures Trace PTX  Discharge Diagnosis Patient Active Problem List   Diagnosis Date Noted   Rib fractures 10/15/2021  Trace PTX, resolved  Consultants None  Imaging: DG CHEST PORT 1 VIEW  Result Date: 10/17/2021 CLINICAL DATA:  Pneumothorax EXAM: PORTABLE CHEST 1 VIEW COMPARISON:  Chest x-ray dated October 16, 2021 FINDINGS: Cardiac and mediastinal contours are unchanged. Lower lung predominant scattered linear opacities, unchanged when compared with prior and likely due to atelectasis. No visible pneumothorax. No pleural effusion. Multiple right-sided rib fractures with associated pleural thickening. IMPRESSION: No visible pneumothorax. Electronically Signed   By: Yetta Glassman M.D.   On: 10/17/2021 08:30   DG Chest Port 1 View  Result Date: 10/16/2021 CLINICAL DATA:  Rib fractures.  Pneumothorax EXAM: PORTABLE CHEST 1 VIEW COMPARISON:  Chest CT from yesterday FINDINGS: Trace right apical pneumothorax. Extensive known rib fractures on the right. Increase in bandlike opacity at the bases. No large or definite increase in pleural fluid. Normal heart size and stable aortic tortuosity. IMPRESSION: 1. Unchanged trace right apical pneumothorax. 2. Increased atelectasis. Electronically Signed   By: Jorje Guild M.D.   On: 10/16/2021 06:34    Procedures None  Hospital Course:  Linda Wood is a 66 y.o. female who presented to [facility] s/p fall with right sided chest pain.  Workup showed right rib fractures and trace right pneumothorax. Patient was admitted for pain control and observation. She had a repeat CXR 6/20 which showed stable apical right PTX and follow up CXR 6/21 showed resolution in right PTX. She was able to be weaned to room air. Patient reported some constipation 6/21 which  resolved with a bowel regimen and mobilization.    Patient worked with therapies during this admission. On 6/21 felt stable for discharge home.  Patient will follow up as below and knows to call with questions or concerns.    I have personally reviewed the patients medication history on the [place] controlled substance database.     Allergies as of 10/17/2021       Reactions   Other Anaphylaxis   Per pt, had an anaphylactic response to "a cough medication"        Medication List     TAKE these medications    acetaminophen 160 MG/5ML solution Commonly known as: TYLENOL Take 31.3 mLs (1,000 mg total) by mouth every 6 (six) hours as needed for mild pain.   ergocalciferol 1.25 MG (50000 UT) capsule Commonly known as: VITAMIN D2 Take 50,000 Units by mouth every Saturday.   escitalopram 20 MG tablet Commonly known as: LEXAPRO Take 20 mg by mouth daily.   hydroxychloroquine 200 MG tablet Commonly known as: PLAQUENIL Take 200 mg by mouth in the morning.   lidocaine 5 % Commonly known as: LIDODERM Place 1 patch onto the skin daily. Remove & Discard patch within 12 hours or as directed by MD Start taking on: October 18, 2021   losartan 50 MG tablet Commonly known as: COZAAR Take 50 mg by mouth in the morning.   methocarbamol 500 MG tablet Commonly known as: ROBAXIN Take 2 tablets (1,000 mg total) by mouth every 6 (six) hours as needed for muscle spasms.   oxyCODONE 5 MG/5ML solution Commonly known as: ROXICODONE Take 5-10 mLs (5-10 mg total) by mouth every 4 (four) hours as needed for moderate pain or  severe pain (5 mg for moderate, 10 mg for severe).   polyethylene glycol 17 g packet Commonly known as: MIRALAX / GLYCOLAX Take 17 g by mouth daily as needed for mild constipation.          Follow-up Information     Shelda Pal, DO. Schedule an appointment as soon as possible for a visit in 1 week(s).   Specialty: Family Medicine Why: For further pain control  related to rib fractures after hospital discharge. Contact information: [place] [address] [phone]                  Signed: Barkley Boards, [facility] 10/17/2021, 3:16 PM Please see Amion for pager number during day hours 7:00am-4:30pm

## 2021-10-17 NOTE — Progress Notes (Signed)
Occupational Therapy Treatment Patient Details Name: Linda Wood MRN: [id number] DOB: 1956-01-27 Today's Date: 10/17/2021   History of present illness Patient is a 66 y/o female who presents on 6/19 with right sided pain s/p fall, + ETOH. Found to have Rt 3-8 rib fractures and very small PTX. PMH includes HTN.   OT comments  Patient received in bed and agreeable to OT session. Patient able to get to EOB with increased time and supervision. Patient was min guard to ambulate to bathroom and perform grooming and bathing standing at sink with supervision. Dressing performed seated in recliner. SpO2 94 on 2 liters.  Attempted RA only with 93 seated, 87 following ambulating in hallway, back to seated at 90 on RA, and returned 2 liters and 94. Patient motivated towards returning home. Acute OT to continue to follow.    Recommendations for follow up therapy are one component of a multi-disciplinary discharge planning process, led by the attending physician.  Recommendations may be updated based on patient status, additional functional criteria and insurance authorization.    Follow Up Recommendations  No OT follow up    Assistance Recommended at Discharge Intermittent Supervision/Assistance  Patient can return home with the following  A little help with walking and/or transfers;A little help with bathing/dressing/bathroom;Assistance with cooking/housework;Assist for transportation;Help with stairs or ramp for entrance   Equipment Recommendations  None recommended by OT    Recommendations for Other Services      Precautions / Restrictions Precautions Precautions: Fall;Other (comment) Precaution Comments: watch 02 Restrictions Weight Bearing Restrictions: No       Mobility Bed Mobility Overal bed mobility: Needs Assistance Bed Mobility: Rolling, Sidelying to Sit Rolling: Supervision Sidelying to sit: Supervision, HOB elevated       General bed mobility comments: verbal cues and  increased time due to pain    Transfers Overall transfer level: Needs assistance Equipment used: None Transfers: Sit to/from Stand Sit to Stand: Min guard           General transfer comment: min guard for safety     Balance Overall balance assessment: Needs assistance Sitting-balance support: Feet supported, No upper extremity supported Sitting balance-Leahy Scale: Good     Standing balance support: During functional activity Standing balance-Leahy Scale: Fair Standing balance comment: stood at sink for self care tasks without UE support                           ADL either performed or assessed with clinical judgement   ADL Overall ADL's : Needs assistance/impaired     Grooming: Wash/dry hands;Wash/dry face;Oral care;Supervision/safety;Sitting Grooming Details (indicate cue type and reason): stood at sink, declined combing hair Upper Body Bathing: Supervision/ safety;Standing Upper Body Bathing Details (indicate cue type and reason): at sink     Upper Body Dressing : Minimal assistance;Sitting Upper Body Dressing Details (indicate cue type and reason): change gown Lower Body Dressing: Supervision/safety;Sitting/lateral leans Lower Body Dressing Details (indicate cue type and reason): changed socks             Functional mobility during ADLs: Min guard General ADL Comments: patient ambulated to bathroom sink without an assistive device and min guard. Patient performed bathing tasks standing at sink and dressing seated in recliner    Extremity/Trunk Assessment              Vision       Perception     Praxis      Cognition Arousal/Alertness:  Awake/alert Behavior During Therapy: WFL for tasks assessed/performed Overall Cognitive Status: Within Functional Limits for tasks assessed                                          Exercises      Shoulder Instructions       General Comments 94 on 2 liters of oxygen. RA trails  with 93 seated, 87 following ambulating 100 ft, increased to 90 seated, and returned to 94 with 2 liters    Pertinent Vitals/ Pain       Pain Assessment Pain Assessment: Faces Faces Pain Scale: Hurts even more Pain Location: right side Pain Descriptors / Indicators: Grimacing, Discomfort, Sore, Sharp Pain Intervention(s): Limited activity within patient's tolerance, Monitored during session, Repositioned  Home Living                                          Prior Functioning/Environment              Frequency  Min 2X/week        Progress Toward Goals  OT Goals(current goals can now be found in the care plan section)  Progress towards OT goals: Progressing toward goals  Acute Rehab OT Goals Patient Stated Goal: go home OT Goal Formulation: With patient Time For Goal Achievement: 10/23/21 Potential to Achieve Goals: Good ADL Goals Pt Will Perform Grooming: with modified independence;standing Pt Will Perform Tub/Shower Transfer: Shower transfer;with modified independence;ambulating Additional ADL Goal #1: Pt will walk to bathroom and complete all toileting Ily. Additional ADL Goal #2: Pt will dress self Ily  Plan Discharge plan remains appropriate    Co-evaluation                 AM-PAC OT "6 Clicks" Daily Activity     Outcome Measure   Help from another person eating meals?: None Help from another person taking care of personal grooming?: A Little Help from another person toileting, which includes using toliet, bedpan, or urinal?: A Little Help from another person bathing (including washing, rinsing, drying)?: A Little Help from another person to put on and taking off regular upper body clothing?: A Little Help from another person to put on and taking off regular lower body clothing?: A Little 6 Click Score: 19    End of Session Equipment Utilized During Treatment: Oxygen  OT Visit Diagnosis: Unsteadiness on feet (R26.81)   Activity  Tolerance Patient tolerated treatment well   Patient Left in chair;with call bell/phone within reach;with chair alarm set   Nurse Communication Mobility status (O2 readings)        Time: 7209-4709 OT Time Calculation (min): 29 min  Charges: OT General Charges $OT Visit: 1 Visit OT Treatments $Self Care/Home Management : 8-22 mins $Therapeutic Activity: 8-22 mins  Lodema Hong, [place]  Office [phone]   Trixie Dredge 10/17/2021, 10:33 AM

## 2021-10-17 NOTE — Progress Notes (Signed)
Pt with d.c orders. Discharge paperwork reviewed with pt and all questions answered. IV removed. Prescriptions eletronically faxed to pharmacy. Pt escorted out via wheelchair with all belongings to private vehicle.

## 2021-10-17 NOTE — Progress Notes (Signed)
Physical Therapy Treatment Patient Details Name: Linda Wood MRN: [id number] DOB: 02-28-1956 Today's Date: 10/17/2021   History of Present Illness Patient is a 66 y/o female who presents on 6/19 with right sided pain s/p fall, + ETOH. Found to have Rt 3-8 rib fractures and very small PTX. PMH includes HTN.    PT Comments    Patient progressing well towards PT goals. Reports pain is improved today. Has not had a BM yet. Session focused on progressive ambulation and stair training on RA. Requires 1 standing rest break during activity but able to maintain SP02 >90% with ambulation. Requires standing rest break on stairs and SP02 dropped to 87% post stair training but improved quickly with rest break and cues for pursed lip breathing. 2/4 DOE noted. Reviewed importance of IS and energy conservation techniques re limiting stair negotiation initially until ventilation improves. Will continue to follow to improve endurance.    Recommendations for follow up therapy are one component of a multi-disciplinary discharge planning process, led by the attending physician.  Recommendations may be updated based on patient status, additional functional criteria and insurance authorization.  Follow Up Recommendations  No PT follow up     Assistance Recommended at Discharge PRN  Patient can return home with the following Assistance with cooking/housework;Assist for transportation;Help with stairs or ramp for entrance;A little help with bathing/dressing/bathroom   Equipment Recommendations  None recommended by PT    Recommendations for Other Services       Precautions / Restrictions Precautions Precautions: Fall;Other (comment) Precaution Comments: watch 02 Restrictions Weight Bearing Restrictions: No     Mobility  Bed Mobility               General bed mobility comments: Up in chair upon PT arrival.    Transfers Overall transfer level: Needs assistance Equipment used:  None Transfers: Sit to/from Stand Sit to Stand: Modified independent (Device/Increase time)           General transfer comment: No assist needed, stood from chair x2, from toilet x1.    Ambulation/Gait Ambulation/Gait assistance: Modified independent (Device/Increase time) Gait Distance (Feet): 200 Feet Assistive device: None Gait Pattern/deviations: Step-through pattern, Decreased stride length Gait velocity: decreased Gait velocity interpretation: 1.31 - 2.62 ft/sec, indicative of limited community ambulator   General Gait Details: Slow steady gait, 1 standing rest break. Sp02 stayed >90% during walk. 2/4 DOE.   Stairs Stairs: Yes Stairs assistance: Supervision Stair Management: One rail Left, Alternating pattern, Forwards Number of Stairs: 7 General stair comments: Cues for technique/safety. 1 standing rest break at top of stairs, Sp02 dropped to 87% post stairs but recovers quickly with rest break and cues for pursed lip breathing.   Wheelchair Mobility    Modified Rankin (Stroke Patients Only)       Balance Overall balance assessment: Needs assistance Sitting-balance support: Feet supported, No upper extremity supported Sitting balance-Leahy Scale: Good     Standing balance support: During functional activity Standing balance-Leahy Scale: Fair                              Cognition Arousal/Alertness: Awake/alert Behavior During Therapy: WFL for tasks assessed/performed Overall Cognitive Status: Within Functional Limits for tasks assessed  Exercises      General Comments General comments (skin integrity, edema, etc.): Sp02 ranged from 87-93% on RA during activity. Reviewed importance of using IS esp at home.      Pertinent Vitals/Pain Pain Assessment Pain Assessment: Faces Faces Pain Scale: Hurts little more Pain Location: right side with movement Pain Descriptors / Indicators:  Grimacing, Discomfort, Sore, Sharp Pain Intervention(s): Monitored during session, Repositioned    Home Living                          Prior Function            PT Goals (current goals can now be found in the care plan section) Progress towards PT goals: Progressing toward goals    Frequency    Min 4X/week      PT Plan Current plan remains appropriate    Co-evaluation              AM-PAC PT "6 Clicks" Mobility   Outcome Measure  Help needed turning from your back to your side while in a flat bed without using bedrails?: None Help needed moving from lying on your back to sitting on the side of a flat bed without using bedrails?: None Help needed moving to and from a bed to a chair (including a wheelchair)?: None Help needed standing up from a chair using your arms (e.g., wheelchair or bedside chair)?: None Help needed to walk in hospital room?: None Help needed climbing 3-5 steps with a railing? : A Little 6 Click Score: 23    End of Session Equipment Utilized During Treatment: Gait belt Activity Tolerance: Patient tolerated treatment well Patient left: in chair;with call bell/phone within reach Nurse Communication: Mobility status PT Visit Diagnosis: Pain;Muscle weakness (generalized) (M62.81);Unsteadiness on feet (R26.81) Pain - Right/Left: Right Pain - part of body:  (ribs)     Time: 6295-2841 PT Time Calculation (min) (ACUTE ONLY): 16 min  Charges:  $Gait Training: 8-22 mins                     Marisa Severin, PT, DPT Acute Rehabilitation Services Secure chat preferred Office [place] 10/17/2021, 2:06 PM

## 2021-10-17 NOTE — Progress Notes (Cosign Needed)
Progress Note     Subjective: Pt reports she is feeling constipated this AM and has poor appetite secondary to this. This is not typical for her at home, she usually has more diarrhea. She is still requiring some oxygen with ambulation this AM. Does not wear oxygen at home.   Objective: Vital signs in last 24 hours: Temp:  [97.4 F (36.3 C)-98.2 F (36.8 C)] 97.4 F (36.3 C) (06/21 0734) Pulse Rate:  [73-78] 73 (06/21 0734) Resp:  [16-20] 16 (06/21 0734) BP: (137-160)/(68-89) 158/84 (06/21 0734) SpO2:  [93 %-96 %] 93 % (06/21 0734) Last BM Date : 10/16/21  Intake/Output from previous day: 06/20 0701 - 06/21 0700 In: 240 [P.O.:240] Out: 250 [Urine:250] Intake/Output this shift: No intake/output data recorded.  PE: General: pleasant, WD, overweight female who is sitting up in NAD Heart: regular, rate, and rhythm.  Lungs: CTAB, no wheezes, rhonchi, or rales noted.  Respiratory effort nonlabored Abd: soft, NT, ND MS: all 4 extremities are symmetrical with no cyanosis, clubbing, or edema. Psych: A&Ox3 with an appropriate affect.   Lab Results:  Recent Labs    10/15/21 1029 10/16/21 0342  WBC 10.1 7.9  HGB 13.9 12.2  HCT 42.8 38.2  PLT 311 268   BMET Recent Labs    10/16/21 0342 10/17/21 0607  NA 136 138  K 4.0 4.1  CL 104 104  CO2 25 25  GLUCOSE 103* 110*  BUN 7* 9  CREATININE 0.48 0.57  CALCIUM 9.7 9.8   PT/INR No results for input(s): "LABPROT", "INR" in the last 72 hours. CMP     Component Value Date/Time   NA 138 10/17/2021 0607   K 4.1 10/17/2021 0607   CL 104 10/17/2021 0607   CO2 25 10/17/2021 0607   GLUCOSE 110 (H) 10/17/2021 0607   BUN 9 10/17/2021 0607   CREATININE 0.57 10/17/2021 0607   CALCIUM 9.8 10/17/2021 0607   GFRNONAA >60 10/17/2021 0607   Lipase  No results found for: "LIPASE"     Studies/Results: DG CHEST PORT 1 VIEW  Result Date: 10/17/2021 CLINICAL DATA:  Pneumothorax EXAM: PORTABLE CHEST 1 VIEW COMPARISON:  Chest  x-ray dated October 16, 2021 FINDINGS: Cardiac and mediastinal contours are unchanged. Lower lung predominant scattered linear opacities, unchanged when compared with prior and likely due to atelectasis. No visible pneumothorax. No pleural effusion. Multiple right-sided rib fractures with associated pleural thickening. IMPRESSION: No visible pneumothorax. Electronically Signed   By: Yetta Glassman M.D.   On: 10/17/2021 08:30   DG Chest Port 1 View  Result Date: 10/16/2021 CLINICAL DATA:  Rib fractures.  Pneumothorax EXAM: PORTABLE CHEST 1 VIEW COMPARISON:  Chest CT from yesterday FINDINGS: Trace right apical pneumothorax. Extensive known rib fractures on the right. Increase in bandlike opacity at the bases. No large or definite increase in pleural fluid. Normal heart size and stable aortic tortuosity. IMPRESSION: 1. Unchanged trace right apical pneumothorax. 2. Increased atelectasis. Electronically Signed   By: Jorje Guild M.D.   On: 10/16/2021 06:34    Anti-infectives: Anti-infectives (From admission, onward)    Start     Dose/Rate Route Frequency Ordered Stop   10/16/21 1015  hydroxychloroquine (PLAQUENIL) tablet 200 mg        200 mg Oral Daily 10/16/21 0942          Assessment/Plan Fall R3-8 rib fractures with trace R PTX - multimodal pain control, IS, pulm toilet, repeat CXR without PTX this AM, work on weaning oxygen ?chronic T2 endplate  fracture - no current back pain, PT/OT HTN - home meds reordered Depression - home meds reordered   FEN: reg diet, SLIV; bowel regimen VTE: LMWH ID: no current abx, no leukocytosis and afebrile    Dispo: Wean O2, bowel regimen. Possible discharge later today will recheck this afternoon.   LOS: 0 days   I reviewed last 24 h vitals and pain scores, last 48 h intake and output, last 24 h labs and trends, last 24 h imaging results, and therapy notes .    Norm Parcel, [facility] 10/17/2021, 10:38 AM Please see Amion for  pager number during day hours 7:00am-4:30pm

## 2021-10-29 ENCOUNTER — Encounter: Payer: Self-pay | Admitting: Family Medicine

## 2021-10-29 ENCOUNTER — Ambulatory Visit (INDEPENDENT_AMBULATORY_CARE_PROVIDER_SITE_OTHER): Payer: Medicare HMO | Admitting: Family Medicine

## 2021-10-29 VITALS — BP 136/80 | HR 61 | Temp 98.2°F | Ht 62.0 in | Wt 162.2 lb

## 2021-10-29 DIAGNOSIS — F325 Major depressive disorder, single episode, in full remission: Secondary | ICD-10-CM

## 2021-10-29 DIAGNOSIS — M069 Rheumatoid arthritis, unspecified: Secondary | ICD-10-CM | POA: Diagnosis not present

## 2021-10-29 DIAGNOSIS — I10 Essential (primary) hypertension: Secondary | ICD-10-CM

## 2021-10-29 DIAGNOSIS — F5104 Psychophysiologic insomnia: Secondary | ICD-10-CM | POA: Diagnosis not present

## 2021-10-29 MED ORDER — LOSARTAN POTASSIUM 50 MG PO TABS: 50.0000 mg | ORAL_TABLET | Freq: Every morning | ORAL | 0 refills | Status: DC

## 2021-10-29 MED ORDER — LOSARTAN POTASSIUM 50 MG PO TABS: 50.0000 mg | ORAL_TABLET | Freq: Every morning | ORAL | 2 refills | Status: DC

## 2021-10-29 MED ORDER — ESCITALOPRAM OXALATE 20 MG PO TABS: 20.0000 mg | ORAL_TABLET | Freq: Every day | ORAL | 2 refills | Status: DC

## 2021-10-29 MED ORDER — PRAZOSIN HCL 2 MG PO CAPS: 2.0000 mg | ORAL_CAPSULE | Freq: Every day | ORAL | 0 refills | Status: DC

## 2021-10-29 MED ORDER — METHOCARBAMOL 500 MG PO TABS: 1000.0000 mg | ORAL_TABLET | Freq: Four times a day (QID) | ORAL | 0 refills | Status: DC | PRN

## 2021-10-29 MED ORDER — ESCITALOPRAM OXALATE 20 MG PO TABS: 20.0000 mg | ORAL_TABLET | Freq: Every day | ORAL | 0 refills | Status: DC

## 2021-10-29 NOTE — Patient Instructions (Addendum)
If you do not hear anything about your referral in the next 1-2 weeks, call our office and ask for an update.  Sleep Hygiene Tips: Do not watch TV or look at screens within 1 hour of going to bed. If you do, make sure there is a blue light filter (nighttime mode) involved. Try to go to bed around the same time every night. Wake up at the same time within 1 hour of regular time. Ex: If you wake up at 7 AM for work, do not sleep past 8 AM on days that you don't work. Do not drink alcohol before bedtime. Do not consume caffeine-containing beverages after noon or within 9 hours of intended bedtime. Get regular exercise/physical activity in your life, but not within 2 hours of planned bedtime. Do not take naps.  Do not eat within 2 hours of planned bedtime. Melatonin, 3-5 mg 30-60 minutes before planned bedtime may be helpful.  The bed should be for sleep or sex only. If after 20-30 minutes you are unable to fall asleep, get up and do something relaxing. Do this until you feel ready to go to sleep again.   Sleep is important to [place] all. Getting good sleep is imperative to adequate functioning during the day. Work with our counselors who are trained to help people obtain quality sleep. Call [phone] to schedule an appointment or if you are curious about insurance coverage/cost.  Keep the diet clean and stay active.  Let us know if you need anything.

## 2021-10-29 NOTE — Progress Notes (Signed)
Chief Complaint  Patient presents with   New Patient (Initial Visit)    Needs refills today Would like something flor sleep       New Patient Visit SUBJECTIVE: HPI: Linda Wood is an 66 y.o.female who is being seen for establishing care.  The patient was previously seen in [place].  Insomnia 2 years of having issues falling asleep. Has never been rx'd any medication. Tried Benadryl, Melatonin without relief. Has not had CBT. Feels mind is racing.  She has never been on any prescription medication but did try half a tab of her brothers Ambien which did not help.  She would prefer not to be on anything addictive  Hypertension Patient presents for hypertension follow up. She does monitor home blood pressures. Blood pressures ranging on average from 140's/80's. She is compliant with medication- losartan 50 mg/d. Patient has these side effects of medication: none She is sometimes adhering to a healthy diet overall. Exercise: none No CP or SOB.   Depression Patient has a history of depression.  She currently takes Lexapro 20 mg daily.  She reports compliance with no adverse effects.  Symptoms are well controlled.  She denies any homicidal or suicidal ideation.  She is not self-medicating with anything.  She is not currently following with a counselor or psychologist nor has she ever.  She has a history of rheumatoid arthritis and takes Plaquenil 200 mg daily for.  She is currently well controlled and has not set up with a rheumatologist in the area.  Past Medical History:  Diagnosis Date   Depression    Hypertension    Rheumatoid arthritis ([place])    Past Surgical History:  Procedure Laterality Date   GASTRIC BYPASS     LASIK     Family History  Problem Relation Age of Onset   Heart failure Mother    Breast cancer Mother 23   Allergies  Allergen Reactions   Dextromethorphan Anaphylaxis    Current Outpatient Medications:    acetaminophen (TYLENOL) 160 MG/5ML solution, Take  31.3 mLs (1,000 mg total) by mouth every 6 (six) hours as needed for mild pain., Disp: 120 mL, Rfl: 0   ergocalciferol (VITAMIN D2) 1.25 MG (50000 UT) capsule, Take 50,000 Units by mouth every Saturday., Disp: , Rfl:    hydroxychloroquine (PLAQUENIL) 200 MG tablet, Take 200 mg by mouth in the morning., Disp: , Rfl:    prazosin (MINIPRESS) 2 MG capsule, Take 1 capsule (2 mg total) by mouth at bedtime., Disp: 30 capsule, Rfl: 0   escitalopram (LEXAPRO) 20 MG tablet, Take 1 tablet (20 mg total) by mouth daily., Disp: 90 tablet, Rfl: 2   losartan (COZAAR) 50 MG tablet, Take 1 tablet (50 mg total) by mouth in the morning., Disp: 90 tablet, Rfl: 2   methocarbamol (ROBAXIN) 500 MG tablet, Take 2 tablets (1,000 mg total) by mouth every 6 (six) hours as needed for muscle spasms., Disp: 60 tablet, Rfl: 0  OBJECTIVE: BP 136/80   Pulse 61   Temp 98.2 F (36.8 C) (Oral)   Ht '5\' 2"'$  (1.575 m)   Wt 162 lb 4 oz (73.6 kg)   SpO2 97%   BMI 29.68 kg/m  General:  well developed, well nourished, in no apparent distress Skin:  no significant moles, warts, or growths Lungs:  clear to auscultation, breath sounds equal bilaterally, no respiratory distress Cardio:  regular rate and rhythm, no LE edema or bruits Musculoskeletal:  symmetrical muscle groups noted without atrophy or deformity Neuro:  gait normal Psych: well oriented with normal range of affect and appropriate judgment/insight  ASSESSMENT/PLAN: Essential hypertension - Plan: losartan (COZAAR) 50 MG tablet  Rheumatoid arthritis, involving unspecified site, unspecified whether rheumatoid factor present ([place]) - Plan: Ambulatory referral to Rheumatology  Depression, major, single episode, complete remission ([place]) - Plan: escitalopram (LEXAPRO) 20 MG tablet  Psychophysiological insomnia - Plan: prazosin (MINIPRESS) 2 MG capsule  Chronic, stable.  Continue losartan 50 mg daily.  Counseled on diet and exercise. Chronic, stable.  Continue Plaquenil 200  mg daily, refer to rheumatology team. Chronic, stable.  Continue Lexapro 20 mg daily. Chronic, not currently stable.  Start prazosin 2 mg nightly.  This could be temporary given recent stress with mother.  If it is, I will see her in 6 months.  If it is not, I will see her in 3 to 4 weeks.  Sleep hygiene information provided in AVS in addition to CBT contact information. The patient voiced understanding and agreement to the plan.   [place], DO 10/29/21  4:58 PM

## 2021-10-31 ENCOUNTER — Telehealth: Payer: Self-pay | Admitting: Family Medicine

## 2021-10-31 ENCOUNTER — Other Ambulatory Visit: Payer: Self-pay | Admitting: Family Medicine

## 2021-10-31 DIAGNOSIS — Z1231 Encounter for screening mammogram for malignant neoplasm of breast: Secondary | ICD-10-CM

## 2021-10-31 NOTE — Telephone Encounter (Signed)
Records reviewed.   She is due for a tetanus shot which she can get at her pharmacy.  I would like her to see an eye doctor for her Plaquenil use, OK to refer if she has not set up with someone in the area.  She is due for a mammogram, please order if she is OK to do.

## 2021-10-31 NOTE — Telephone Encounter (Signed)
Called the patient Put in order for mammo. She will do tdap at pharmacy. Discuss with family an eye doctor and call if referral is needed.

## 2021-10-31 NOTE — Telephone Encounter (Signed)
Called left message to call back 

## 2021-11-01 ENCOUNTER — Telehealth (HOSPITAL_BASED_OUTPATIENT_CLINIC_OR_DEPARTMENT_OTHER): Payer: Self-pay

## 2021-11-14 ENCOUNTER — Telehealth (HOSPITAL_BASED_OUTPATIENT_CLINIC_OR_DEPARTMENT_OTHER): Payer: Self-pay

## 2021-11-15 ENCOUNTER — Other Ambulatory Visit: Payer: Self-pay | Admitting: Family Medicine

## 2021-11-15 DIAGNOSIS — I10 Essential (primary) hypertension: Secondary | ICD-10-CM

## 2021-11-19 ENCOUNTER — Other Ambulatory Visit: Payer: Self-pay

## 2021-11-19 ENCOUNTER — Other Ambulatory Visit (HOSPITAL_BASED_OUTPATIENT_CLINIC_OR_DEPARTMENT_OTHER): Payer: Self-pay

## 2021-11-19 ENCOUNTER — Telehealth: Payer: Self-pay | Admitting: Family Medicine

## 2021-11-19 MED ORDER — METHOCARBAMOL 500 MG PO TABS
1000.0000 mg | ORAL_TABLET | Freq: Four times a day (QID) | ORAL | 0 refills | Status: DC | PRN
  Filled 2021-11-19: qty 60, 8d supply, fill #0

## 2021-11-19 NOTE — Telephone Encounter (Signed)
Medication:   methocarbamol (ROBAXIN) 500 MG tablet [[id number]]  hydroxychloroquine (PLAQUENIL) 200 MG tablet [[id number]]   Has the patient contacted their pharmacy? No. (If no, request that the patient contact the pharmacy for the refill.) (If yes, when and what did the pharmacy advise?)  Preferred Pharmacy (with phone number or street name):   [id number] [id number] Phone:  [phone] Fax:  [phone]  Agent: Please be advised that RX refills may take up to 3 business days. We ask that you follow-up with your pharmacy.

## 2021-11-19 NOTE — Telephone Encounter (Signed)
Rx sent in

## 2021-11-20 ENCOUNTER — Other Ambulatory Visit (HOSPITAL_BASED_OUTPATIENT_CLINIC_OR_DEPARTMENT_OTHER): Payer: Self-pay

## 2021-11-21 NOTE — Telephone Encounter (Signed)
Pt called stating their hydroxychloroquine was not sent in to the pharmacy to be refilled.

## 2021-11-21 NOTE — Telephone Encounter (Signed)
Does she have a rheumatologist who fills this?

## 2021-11-21 NOTE — Progress Notes (Signed)
Multiple rib fractures with pneumothorax required physical therapy assessment and treatment

## 2021-11-21 NOTE — Telephone Encounter (Signed)
Please advise , filled by previous provider

## 2021-11-22 ENCOUNTER — Other Ambulatory Visit: Payer: Self-pay | Admitting: Family Medicine

## 2021-11-22 DIAGNOSIS — F5104 Psychophysiologic insomnia: Secondary | ICD-10-CM

## 2021-11-22 MED ORDER — HYDROXYCHLOROQUINE SULFATE 200 MG PO TABS: 200.0000 mg | ORAL_TABLET | Freq: Every morning | ORAL | 2 refills | Status: DC

## 2021-11-22 MED ORDER — METHOCARBAMOL 500 MG PO TABS: 1000.0000 mg | ORAL_TABLET | Freq: Four times a day (QID) | ORAL | 2 refills | Status: DC | PRN

## 2021-11-22 NOTE — Telephone Encounter (Signed)
Refilled Patient made aware

## 2021-11-22 NOTE — Telephone Encounter (Signed)
Called left message to call back 

## 2021-11-22 NOTE — Telephone Encounter (Signed)
Pt states she could not get in to see a Rheumatologist until January.

## 2021-11-27 ENCOUNTER — Other Ambulatory Visit: Payer: Self-pay | Admitting: Family Medicine

## 2021-11-27 ENCOUNTER — Telehealth: Payer: Self-pay | Admitting: Family Medicine

## 2021-11-27 DIAGNOSIS — F5104 Psychophysiologic insomnia: Secondary | ICD-10-CM

## 2021-11-27 NOTE — Telephone Encounter (Signed)
Patient called to advise that the prazosin is causing her to feel super dizzy and she would like something else to be called in that will not create the dizziness. Patient requested it to be noted on her account that she is having a reaction to this medication. [facility] [address] In [place]. Please advise patient.

## 2021-11-27 NOTE — Telephone Encounter (Signed)
Scheduled appt.

## 2021-11-28 ENCOUNTER — Other Ambulatory Visit (HOSPITAL_BASED_OUTPATIENT_CLINIC_OR_DEPARTMENT_OTHER): Payer: Self-pay

## 2021-11-28 ENCOUNTER — Telehealth (INDEPENDENT_AMBULATORY_CARE_PROVIDER_SITE_OTHER): Payer: Medicare HMO | Admitting: Family Medicine

## 2021-11-28 ENCOUNTER — Encounter: Payer: Self-pay | Admitting: Family Medicine

## 2021-11-28 DIAGNOSIS — F5104 Psychophysiologic insomnia: Secondary | ICD-10-CM | POA: Diagnosis not present

## 2021-11-28 MED ORDER — TRAZODONE HCL 50 MG PO TABS
25.0000 mg | ORAL_TABLET | Freq: Every evening | ORAL | 3 refills | Status: DC | PRN
  Filled 2021-11-28: qty 30, 30d supply, fill #0
  Filled 2022-01-02: qty 30, 30d supply, fill #1
  Filled 2022-02-09: qty 30, 30d supply, fill #2
  Filled 2022-03-20: qty 30, 30d supply, fill #3

## 2021-11-28 NOTE — Progress Notes (Signed)
CC: Follow up insomnia  Subjective: Patient is a 66 y.o. female here for follow up insomnia. Due to COVID-19 pandemic, we are interacting via web portal for an electronic face-to-face visit. I verified patient's ID using 2 identifiers. Patient agreed to proceed with visit via this method. Patient is at home, I am at office. Patient and I are present for visit.   Patient was started on prazosin 2 mg nightly.  It did not work and worse, made her feel dizzy.  She was compliant initially.  Again, she has never been on any other prescription medication.  She is currently taking Lexapro 20 mg daily.  Her sleep has not improved over the last month.  She has not reached out to the counseling team.  Past Medical History:  Diagnosis Date   Depression    Hypertension    Rheumatoid arthritis ([place])     Objective: No conversational dyspnea Age appropriate judgment and insight Nml affect and mood   Assessment and Plan: Psychophysiological insomnia - Plan: traZODone (DESYREL) 50 MG tablet  Chronic, uncontrolled.  Start trazodone 25-50 mg nightly.  Follow-up in 1 month if no improvement.  She will send me a message in 2 weeks.  Could consider Remeron or low-dose doxepin. The patient voiced understanding and agreement to the plan.  [place], DO 11/28/21  10:11 AM

## 2021-12-17 ENCOUNTER — Telehealth: Payer: Self-pay | Admitting: Family Medicine

## 2021-12-17 NOTE — Telephone Encounter (Signed)
Patient wanted to inform Dr. Nani Ravens  that the trazodone is working well for her.

## 2022-01-02 ENCOUNTER — Other Ambulatory Visit (HOSPITAL_BASED_OUTPATIENT_CLINIC_OR_DEPARTMENT_OTHER): Payer: Self-pay

## 2022-01-30 ENCOUNTER — Ambulatory Visit: Payer: Medicare HMO

## 2022-02-04 ENCOUNTER — Ambulatory Visit: Payer: Medicare HMO

## 2022-02-11 ENCOUNTER — Other Ambulatory Visit (HOSPITAL_BASED_OUTPATIENT_CLINIC_OR_DEPARTMENT_OTHER): Payer: Self-pay

## 2022-03-20 ENCOUNTER — Other Ambulatory Visit (HOSPITAL_BASED_OUTPATIENT_CLINIC_OR_DEPARTMENT_OTHER): Payer: Self-pay

## 2022-03-25 DIAGNOSIS — H179 Unspecified corneal scar and opacity: Secondary | ICD-10-CM | POA: Diagnosis not present

## 2022-03-25 DIAGNOSIS — Z79899 Other long term (current) drug therapy: Secondary | ICD-10-CM | POA: Diagnosis not present

## 2022-03-25 DIAGNOSIS — H2513 Age-related nuclear cataract, bilateral: Secondary | ICD-10-CM | POA: Diagnosis not present

## 2022-03-25 DIAGNOSIS — H25013 Cortical age-related cataract, bilateral: Secondary | ICD-10-CM | POA: Diagnosis not present

## 2022-03-25 LAB — HM DIABETES EYE EXAM

## 2022-03-26 ENCOUNTER — Encounter: Payer: Self-pay | Admitting: Family Medicine

## 2022-04-18 ENCOUNTER — Other Ambulatory Visit: Payer: Self-pay | Admitting: Family Medicine

## 2022-04-18 DIAGNOSIS — F5104 Psychophysiologic insomnia: Secondary | ICD-10-CM

## 2022-04-19 ENCOUNTER — Other Ambulatory Visit (HOSPITAL_BASED_OUTPATIENT_CLINIC_OR_DEPARTMENT_OTHER): Payer: Self-pay

## 2022-04-19 MED ORDER — TRAZODONE HCL 50 MG PO TABS
25.0000 mg | ORAL_TABLET | Freq: Every evening | ORAL | 3 refills | Status: DC | PRN
  Filled 2022-04-19: qty 30, 30d supply, fill #0

## 2022-04-19 NOTE — Telephone Encounter (Signed)
Last OV 11/2021 Last RF 11/28/2021

## 2022-05-03 ENCOUNTER — Encounter: Payer: Self-pay | Admitting: Family Medicine

## 2022-05-03 ENCOUNTER — Other Ambulatory Visit (HOSPITAL_BASED_OUTPATIENT_CLINIC_OR_DEPARTMENT_OTHER): Payer: Self-pay

## 2022-05-03 ENCOUNTER — Ambulatory Visit (INDEPENDENT_AMBULATORY_CARE_PROVIDER_SITE_OTHER): Payer: Medicare HMO | Admitting: Family Medicine

## 2022-05-03 VITALS — BP 120/82 | HR 73 | Temp 98.0°F | Ht 62.0 in | Wt 158.0 lb

## 2022-05-03 DIAGNOSIS — I10 Essential (primary) hypertension: Secondary | ICD-10-CM | POA: Diagnosis not present

## 2022-05-03 DIAGNOSIS — F325 Major depressive disorder, single episode, in full remission: Secondary | ICD-10-CM | POA: Diagnosis not present

## 2022-05-03 DIAGNOSIS — F5104 Psychophysiologic insomnia: Secondary | ICD-10-CM | POA: Diagnosis not present

## 2022-05-03 DIAGNOSIS — Z23 Encounter for immunization: Secondary | ICD-10-CM | POA: Diagnosis not present

## 2022-05-03 DIAGNOSIS — E2839 Other primary ovarian failure: Secondary | ICD-10-CM

## 2022-05-03 DIAGNOSIS — Z1231 Encounter for screening mammogram for malignant neoplasm of breast: Secondary | ICD-10-CM | POA: Diagnosis not present

## 2022-05-03 MED ORDER — TRAZODONE HCL 50 MG PO TABS
25.0000 mg | ORAL_TABLET | Freq: Every evening | ORAL | 2 refills | Status: DC | PRN
  Filled 2022-05-03 – 2022-06-04 (×5): qty 90, 90d supply, fill #0
  Filled 2022-09-09: qty 90, 90d supply, fill #1
  Filled 2022-12-18: qty 90, 90d supply, fill #2

## 2022-05-03 MED ORDER — FLUTICASONE PROPIONATE 50 MCG/ACT NA SUSP
2.0000 | Freq: Every day | NASAL | 6 refills | Status: DC
  Filled 2022-05-03: qty 16, 30d supply, fill #0

## 2022-05-03 NOTE — Addendum Note (Signed)
Addended by: Sharon Seller B on: 05/03/2022 01:36 PM   Modules accepted: Orders

## 2022-05-03 NOTE — Patient Instructions (Addendum)
Keep the diet clean and stay active.  Aim to do some physical exertion for 150 minutes per week. This is typically divided into 5 days per week, 30 minutes per day. The activity should be enough to get your heart rate up. Anything is better than nothing if you have time constraints.  Try to hold your stretches for 30 seconds.   Because your blood pressure is well-controlled, you no longer have to check your blood pressure at home anymore unless you wish. Some people check it twice daily every day and some people stop altogether. Either or anything in between is fine. Strong work!  When you check your BP, make sure you have been doing something calm/relaxing 5 minutes prior to checking. Both feet should be flat on the floor and you should be sitting. Use your left arm and make sure it is in a relaxed position (on a table), and that the cuff is at the approximate level/height of your heart.  Ask your pharmacy about getting an updated tetanus booster.   Consider using Flonase daily. This is also available over the counter.   Let us know if you need anything.

## 2022-05-03 NOTE — Progress Notes (Signed)
Chief Complaint  Patient presents with   Follow-up    6 month     Subjective Linda Wood presents for f/u depression.  Pt is currently being treated with Lexapro 20 mg/d, trazodone 25-50 mg qhs prn.  Reports doing well since treatment. No thoughts of harming self or others. No self-medication with alcohol, prescription drugs or illicit drugs. Pt is not following with a counselor/psychologist.  Hypertension Patient presents for hypertension follow up. She does monitor home blood pressures. Blood pressures ranging on average from 130's/80's. She is usually compliant with medication- losartan 50 mg/d. Patient has these side effects of medication: none She is sometimes adhering to a healthy diet overall. Exercise: walking No CP or SOB.   Past Medical History:  Diagnosis Date   Depression    Hypertension    Rheumatoid arthritis ([place])    Allergies as of 05/03/2022       Reactions   Dextromethorphan Anaphylaxis        Medication List        Accurate as of May 03, 2022  1:29 PM. If you have any questions, ask your nurse or doctor.          acetaminophen 160 MG/5ML solution Commonly known as: TYLENOL Take 31.3 mLs (1,000 mg total) by mouth every 6 (six) hours as needed for mild pain.   ergocalciferol 1.25 MG (50000 UT) capsule Commonly known as: VITAMIN D2 Take 50,000 Units by mouth every Saturday.   escitalopram 20 MG tablet Commonly known as: LEXAPRO Take 1 tablet (20 mg total) by mouth daily.   fluticasone 50 MCG/ACT nasal spray Commonly known as: FLONASE Place 2 sprays into both nostrils daily. Started by: Shelda Pal, DO   hydroxychloroquine 200 MG tablet Commonly known as: PLAQUENIL Take 1 tablet (200 mg total) by mouth in the morning.   losartan 50 MG tablet Commonly known as: COZAAR TAKE 1 TABLET (50 MG TOTAL) BY MOUTH IN THE MORNING   methocarbamol 500 MG tablet Commonly known as: ROBAXIN Take 2 tablets (1,000 mg total) by  mouth every 6 (six) hours as needed for muscle spasms.   traZODone 50 MG tablet Commonly known as: DESYREL Take 0.5-1 tablets (25-50 mg total) by mouth at bedtime as needed for sleep.        Exam BP 120/82 (BP Location: Left Arm, Patient Position: Sitting, Cuff Size: Normal)   Pulse 73   Temp 98 F (36.7 C) (Oral)   Ht '5\' 2"'$  (1.575 m)   Wt 158 lb (71.7 kg)   SpO2 96%   BMI 28.90 kg/m  General:  well developed, well nourished, in no apparent distress Lungs:  No respiratory distress Psych: well oriented with normal range of affect and age-appropriate judgement/insight, alert and oriented x4.  Assessment and Plan  Depression, major, single episode, complete remission ([place])  Essential hypertension  Estrogen deficiency - Plan: DG Bone Density  Encounter for screening mammogram for malignant neoplasm of breast - Plan: MM DIGITAL SCREENING BILATERAL  Psychophysiological insomnia - Plan: traZODone (DESYREL) 50 MG tablet  Chronic, stable. Cont Lexapro 20 mg/d, trazodone 25-50 mg qhs prn.  Chronic, stable. Cont losartan 50 mg/d. Counseled on diet/exercise. Flu shot today. Mammogram and DEXA ordered. Will ck Hep C ab at next visit when we draw labs. She will check w her pharmacy regarding her tetanus booster.  F/u in 6 mo for CPE or prn. The patient voiced understanding and agreement to the plan.  [place], DO 05/03/22 1:29 PM

## 2022-05-05 NOTE — Progress Notes (Signed)
Office Visit Note  Patient: Linda Wood             Date of Birth: Jan 08, 1956           MRN: [id number]             PCP: Shelda Pal, DO Referring: Shelda Pal* Visit Date: 05/10/2022 Occupation: '@GUAROCC'$ @  Subjective:  Pain in both hands  History of Present Illness: Linda Wood is a 67 y.o. female seen in consultation per request of her PCP.  According the patient about 12 years ago she started having pain and swelling in her both hands.  At that time she was referred to rheumatologist in [facility].  She was diagnosed with rheumatoid arthritis.  She was placed on hydroxychloroquine 200 mg p.o. daily.  She has been on the same dose since then.  She states gradually the pain and swelling resolved and she had no recurrence of rheumatoid arthritis.  She states she gets mild flares which resolved by itself.  Most of flares have been in her hands.  She has been experiencing some discomfort in her knee joints but knee joints have not been swollen.  None of the other joints are painful.  She she moved to [facility] in April 2023.  She had eye examination by Dr. Herbert Deaner in December 2023, according to the patient examination was normal.  Denies history of oral ulcers, nasal ulcers, sicca symptoms, shortness of breath or palpitations.  There is no history of Raynaud's phenomenon or lymphadenopathy.  No family history of rheumatoid arthritis.  She is gravida 3, para 3.    Activities of Daily Living:  Patient reports morning stiffness for 10-15 minutes.   Patient Denies nocturnal pain.  Difficulty dressing/grooming: Denies Difficulty climbing stairs: Denies Difficulty getting out of chair: Denies Difficulty using hands for taps, buttons, cutlery, and/or writing: Denies  Review of Systems  Constitutional:  Negative for fatigue.  HENT:  Negative for mouth sores and mouth dryness.   Eyes:  Negative for dryness.  Respiratory:  Negative for shortness of breath.    Cardiovascular:  Negative for chest pain and palpitations.  Gastrointestinal:  Positive for diarrhea. Negative for blood in stool and constipation.  Endocrine: Negative for increased urination.  Genitourinary:  Negative for involuntary urination.  Musculoskeletal:  Positive for joint pain, joint pain and morning stiffness. Negative for gait problem, joint swelling, myalgias, muscle weakness, muscle tenderness and myalgias.  Skin:  Negative for color change, rash, hair loss and sensitivity to sunlight.  Allergic/Immunologic: Negative for susceptible to infections.  Neurological:  Negative for dizziness and headaches.  Hematological:  Negative for swollen glands.  Psychiatric/Behavioral:  Negative for depressed mood and sleep disturbance. The patient is not nervous/anxious.     PMFS History:  Patient Active Problem List   Diagnosis Date Noted   Psychophysiological insomnia 11/28/2021   Rheumatoid arthritis ([place]) 10/29/2021   Essential hypertension 10/29/2021   Depression, major, single episode, complete remission ([place]) 10/29/2021   Rib fractures 10/15/2021    Past Medical History:  Diagnosis Date   Depression    Hypertension    Rheumatoid arthritis ([place])     Family History  Problem Relation Age of Onset   Heart failure Mother    Breast cancer Mother 73   Past Surgical History:  Procedure Laterality Date   GASTRIC BYPASS     LASIK     Social History   Social History Narrative   Not on file   Immunization History  Administered Date(s) Administered   Fluad Quad(high Dose 65+) 05/03/2022   Moderna Sars-Covid-2 Vaccination 05/02/2020   PFIZER(Purple Top)SARS-COV-2 Vaccination 07/11/2019, 08/01/2019   PNEUMOCOCCAL CONJUGATE-20 10/04/2020   Zoster Recombinat (Shingrix) 10/06/2017, 12/07/2019, 02/25/2020     Objective: Vital Signs: BP 129/84 (BP Location: Right Arm, Patient Position: Sitting, Cuff Size: Normal)   Pulse 71   Ht '5\' 2"'$  (1.575 m)   Wt 157 lb (71.2 kg)    BMI 28.72 kg/m    Physical Exam Vitals and nursing note reviewed.  Constitutional:      Appearance: She is well-developed.  HENT:     Head: Normocephalic and atraumatic.  Eyes:     Conjunctiva/sclera: Conjunctivae normal.  Cardiovascular:     Rate and Rhythm: Normal rate and regular rhythm.     Heart sounds: Normal heart sounds.  Pulmonary:     Effort: Pulmonary effort is normal.     Breath sounds: Normal breath sounds.  Abdominal:     General: Bowel sounds are normal.     Palpations: Abdomen is soft.  Musculoskeletal:     Cervical back: Normal range of motion.  Lymphadenopathy:     Cervical: No cervical adenopathy.  Skin:    General: Skin is warm and dry.     Capillary Refill: Capillary refill takes less than 2 seconds.  Neurological:     Mental Status: She is alert and oriented to person, place, and time.  Psychiatric:        Behavior: Behavior normal.      Musculoskeletal Exam: Cervical spine was in good range of motion.  She had thoracic kyphosis.  Shoulder joints, elbow joints, wrist joints, MCPs PIPs and DIPs been good range of motion with no synovitis.  Hip joints and knee joints with good range of motion.  She had warmth on palpation of her right knee joint.  There was no tenderness over ankles or MTPs.  No synovitis was noted.  CDAI Exam: CDAI Score: -- Patient Global: 1 mm; Provider Global: 1 mm Swollen: --; Tender: -- Joint Exam 05/10/2022   No joint exam has been documented for this visit   There is currently no information documented on the homunculus. Go to the Rheumatology activity and complete the homunculus joint exam.  Investigation: No additional findings.  Imaging: No results found.  Recent Labs: Lab Results  Component Value Date   WBC 7.9 10/16/2021   HGB 12.2 10/16/2021   PLT 268 10/16/2021   NA 138 10/17/2021   K 4.1 10/17/2021   CL 104 10/17/2021   CO2 25 10/17/2021   GLUCOSE 110 (H) 10/17/2021   BUN 9 10/17/2021   CREATININE  0.57 10/17/2021   CALCIUM 9.8 10/17/2021    Speciality Comments: Hydroxychloroquine 200 mg p.o. daily since 2011.  Eye examination December 2023 by Dr. Herbert Deaner per patient  Procedures:  No procedures performed Allergies: Dextromethorphan   Assessment / Plan:     Visit Diagnoses: Rheumatoid arthritis involving multiple sites, unspecified whether rheumatoid factor present ([place]) - Anti-CCP negative on 06/19/18 -patient states that she was diagnosed with rheumatoid arthritis about 12 years ago.  At the time she presented with pain and swelling in her bilateral hands.  She was started on hydroxychloroquine 200 mg p.o. daily by her rheumatologist in [place], [place].  She has done well on low-dose hydroxychloroquine without any flares.  She states she had mild achiness in her hands which resolves by itself.  She did not have to take any prednisone taper.  She has  been experiencing pain and discomfort in her knee joints.  She has occasional discomfort in her feet.  She has not noticed any swelling.  She denies any history of sicca symptoms or shortness of breath.  I will obtain labs today.  Plan: Sedimentation rate, Rheumatoid factor, Cyclic citrul peptide antibody, IgG, ANA  Patient was counseled on the purpose, proper use, and adverse effects of hydroxychloroquine including nausea/diarrhea, skin rash, headaches, and sun sensitivity.  Advised patient to wear sunscreen once starting hydroxychloroquine to reduce risk of rash associated with sun sensitivity.  Discussed importance of annual eye exams while on hydroxychloroquine to monitor to ocular toxicity and discussed importance of frequent laboratory monitoring.  Provided patient with eye exam form for baseline ophthalmologic exam.  Reviewed risk for QTC prolongation when used in combination with other QTc prolonging agents (including but not limited to antiarrhythmics, macrolide antibiotics, flouroquinolones, tricyclic antidepressants, citalopram, specific  antipsychotics, ondansetron, migraine triptans, and methadone). Provided patient with educational materials on hydroxychloroquine and answered all questions.  Patient consented to hydroxychloroquine. Will upload consent in the media tab.    High risk medication use -she is on hydroxychloroquine 200 mg p.o. daily.  Patient states she had eye examination by Dr. Jerold Coombe in December 2023.  Will obtain labs today.  Information about immunization was placed in the AVS.  Plan: CBC with Differential/Platelet, COMPLETE METABOLIC PANEL WITH GFR  Pain in both hands -she has intermittent discomfort in her hands.  She has not noticed much swelling.  Plan: XR Hand 2 View Right, XR Hand 2 View Left.  X-rays of bilateral hands were consistent with osteoarthritis.  Chronic pain of both knees -she complains of discomfort in her bilateral knee joints.  Warmth was noted in her right knee joint.  Plan: XR KNEE 3 VIEW RIGHT, XR KNEE 3 VIEW LEFT.  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  Pain in both feet -she has intermittent discomfort in her feet.  Plan: XR Foot 2 Views Right, XR Foot 2 Views Left.  X-rays showed osteoarthritic changes.  Postural kyphosis of thoracic region-postural kyphosis was noted.  There was no tenderness over thoracic region.  Osteoporosis screening-patient states that DEXA scan was ordered by Dr. Nani Ravens.  Use of calcium rich diet and exercise was emphasized.  She is on vitamin D supplement.  Essential hypertension-blood pressure was normal today.  She is on Cozaar.  Depression, major, single episode, complete remission (HCC)-she takes Cipro.  Psychophysiological insomnia-she is on as a do not for insomnia.  Orders: Orders Placed This Encounter  Procedures   XR Hand 2 View Right   XR Hand 2 View Left   XR Foot 2 Views Right   XR Foot 2 Views Left   XR KNEE 3 VIEW RIGHT   XR KNEE 3 VIEW LEFT   CBC with Differential/Platelet   COMPLETE METABOLIC  PANEL WITH GFR   Sedimentation rate   Rheumatoid factor   Cyclic citrul peptide antibody, IgG   ANA   Meds ordered this encounter  Medications   hydroxychloroquine (PLAQUENIL) 200 MG tablet    Sig: Take 1 tablet (200 mg total) by mouth in the morning.    Dispense:  90 tablet    Refill:  0     Follow-Up Instructions: Return for Rheumatoid arthritis.   Bo Merino, MD  Note - This record has been created using Editor, commissioning.  Chart creation errors have been sought, but may not always  have been located. Such creation errors do not  reflect on  the standard of medical care.

## 2022-05-07 ENCOUNTER — Other Ambulatory Visit (HOSPITAL_BASED_OUTPATIENT_CLINIC_OR_DEPARTMENT_OTHER): Payer: Self-pay

## 2022-05-10 ENCOUNTER — Ambulatory Visit (INDEPENDENT_AMBULATORY_CARE_PROVIDER_SITE_OTHER): Payer: Medicare HMO

## 2022-05-10 ENCOUNTER — Ambulatory Visit: Payer: Medicare HMO

## 2022-05-10 ENCOUNTER — Ambulatory Visit: Payer: Medicare HMO | Attending: Rheumatology | Admitting: Rheumatology

## 2022-05-10 ENCOUNTER — Encounter: Payer: Self-pay | Admitting: Rheumatology

## 2022-05-10 VITALS — BP 129/84 | HR 71 | Ht 62.0 in | Wt 157.0 lb

## 2022-05-10 DIAGNOSIS — Z13820 Encounter for screening for osteoporosis: Secondary | ICD-10-CM | POA: Diagnosis not present

## 2022-05-10 DIAGNOSIS — M79641 Pain in right hand: Secondary | ICD-10-CM

## 2022-05-10 DIAGNOSIS — M79671 Pain in right foot: Secondary | ICD-10-CM

## 2022-05-10 DIAGNOSIS — I10 Essential (primary) hypertension: Secondary | ICD-10-CM

## 2022-05-10 DIAGNOSIS — M79642 Pain in left hand: Secondary | ICD-10-CM | POA: Diagnosis not present

## 2022-05-10 DIAGNOSIS — G8929 Other chronic pain: Secondary | ICD-10-CM | POA: Diagnosis not present

## 2022-05-10 DIAGNOSIS — M25561 Pain in right knee: Secondary | ICD-10-CM | POA: Diagnosis not present

## 2022-05-10 DIAGNOSIS — M069 Rheumatoid arthritis, unspecified: Secondary | ICD-10-CM | POA: Diagnosis not present

## 2022-05-10 DIAGNOSIS — F325 Major depressive disorder, single episode, in full remission: Secondary | ICD-10-CM | POA: Diagnosis not present

## 2022-05-10 DIAGNOSIS — M25562 Pain in left knee: Secondary | ICD-10-CM

## 2022-05-10 DIAGNOSIS — M79672 Pain in left foot: Secondary | ICD-10-CM

## 2022-05-10 DIAGNOSIS — Z79899 Other long term (current) drug therapy: Secondary | ICD-10-CM

## 2022-05-10 DIAGNOSIS — M4004 Postural kyphosis, thoracic region: Secondary | ICD-10-CM

## 2022-05-10 DIAGNOSIS — F5104 Psychophysiologic insomnia: Secondary | ICD-10-CM

## 2022-05-10 MED ORDER — HYDROXYCHLOROQUINE SULFATE 200 MG PO TABS: 200.0000 mg | ORAL_TABLET | Freq: Every morning | ORAL | 0 refills | Status: DC

## 2022-05-10 NOTE — Patient Instructions (Signed)
Hydroxychloroquine Tablets What is this medication? HYDROXYCHLOROQUINE (hye drox ee KLOR [place]) treats autoimmune conditions, such as rheumatoid arthritis and lupus. It works by slowing down an overactive immune system. It may also be used to prevent and treat malaria. It works by killing the parasite that causes malaria. It belongs to a group of medications called DMARDs. This medicine may be used for other purposes; ask your health care provider or pharmacist if you have questions. COMMON BRAND NAME(S): Plaquenil, Quineprox What should I tell my care team before I take this medication? They need to know if you have any of these conditions: Diabetes Eye disease, vision problems Frequently drink alcohol G6PD deficiency Heart disease Irregular heartbeat or rhythm Kidney disease Liver disease Porphyria Psoriasis An unusual or allergic reaction to hydroxychloroquine, other medications, foods, dyes, or preservatives Pregnant or trying to get pregnant Breastfeeding How should I use this medication? Take this medication by mouth with water. Take it as directed on the prescription label. Do not cut, crush, or chew this medication. Swallow the tablets whole. Take it with food. Do not take it more than directed. Take all of this medication unless your care team tells you to stop it early. Keep taking it even if you think you are better. Take products with antacids in them at a different time of day than this medication. Take this medication 4 hours before or 4 hours after antacids. Talk to your care team if you have questions. Talk to your care team about the use of this medication in children. While this medication may be prescribed for selected conditions, precautions do apply. Overdosage: If you think you have taken too much of this medicine contact a poison control center or emergency room at once. NOTE: This medicine is only for you. Do not share this medicine with others. What if I miss a  dose? If you miss a dose, take it as soon as you can. If it is almost time for your next dose, take only that dose. Do not take double or extra doses. What may interact with this medication? Do not take this medication with any of the following: Cisapride Dronedarone Pimozide Thioridazine This medication may also interact with the following: Ampicillin Antacids Cimetidine Cyclosporine Digoxin Kaolin Medications for diabetes, such as insulin, glipizide, glyburide Medications for seizures, such as carbamazepine, phenobarbital, phenytoin Mefloquine Methotrexate Other medications that cause heart rhythm changes Praziquantel This list may not describe all possible interactions. Give your health care provider a list of all the medicines, herbs, non-prescription drugs, or dietary supplements you use. Also tell them if you smoke, drink alcohol, or use illegal drugs. Some items may interact with your medicine. What should I watch for while using this medication? Visit your care team for regular checks on your progress. Tell your care team if your symptoms do not start to get better or if they get worse. You may need blood work done while you are taking this medication. If you take other medications that can affect heart rhythm, you may need more testing. Talk to your care team if you have questions. Your vision may be tested before and during use of this medication. Tell your care team right away if you have any change in your eyesight. This medication may cause serious skin reactions. They can happen weeks to months after starting the medication. Contact your care team right away if you notice fevers or flu-like symptoms with a rash. The rash may be red or purple and then turn  into blisters or peeling of the skin. Or, you might notice a red rash with swelling of the face, lips or lymph nodes in your neck or under your arms. If you or your family notice any changes in your behavior, such as new or  worsening depression, thoughts of harming yourself, anxiety, or other unusual or disturbing thoughts, or memory loss, call your care team right away. What side effects may I notice from receiving this medication? Side effects that you should report to your care team as soon as possible: Allergic reactions--skin rash, itching, hives, swelling of the face, lips, tongue, or throat Aplastic anemia--unusual weakness or fatigue, dizziness, headache, trouble breathing, increased bleeding or bruising Change in vision Heart rhythm changes--fast or irregular heartbeat, dizziness, feeling faint or lightheaded, chest pain, trouble breathing Infection--fever, chills, cough, or sore throat Low blood sugar (hypoglycemia)--tremors or shaking, anxiety, sweating, cold or clammy skin, confusion, dizziness, rapid heartbeat Muscle injury--unusual weakness or fatigue, muscle pain, dark yellow or brown urine, decrease in amount of urine Pain, tingling, or numbness in the hands or feet Rash, fever, and swollen lymph nodes Redness, blistering, peeling, or loosening of the skin, including inside the mouth Thoughts of suicide or self-harm, worsening mood, or feelings of depression Unusual bruising or bleeding Side effects that usually do not require medical attention (report to your care team if they continue or are bothersome): Diarrhea Headache Nausea Stomach pain Vomiting This list may not describe all possible side effects. Call your doctor for medical advice about side effects. You may report side effects to FDA at 1-800-FDA-1088. Where should I keep my medication? Keep out of the reach of children and pets. Store at room temperature up to 30 degrees C (86 degrees F). Protect from light. Get rid of any unused medication after the expiration date. To get rid of medications that are no longer needed or have expired: Take the medication to a medication take-back program. Check with your pharmacy or law enforcement  to find a location. If you cannot return the medication, check the label or package insert to see if the medication should be thrown out in the garbage or flushed down the toilet. If you are not sure, ask your care team. If it is safe to put it in the trash, empty the medication out of the container. Mix the medication with cat litter, dirt, coffee grounds, or other unwanted substance. Seal the mixture in a bag or container. Put it in the trash. NOTE: This sheet is a summary. It may not cover all possible information. If you have questions about this medicine, talk to your doctor, pharmacist, or health care provider.  2023 Elsevier/Gold Standard (2004-06-26 00:00:00)  Vaccines You are taking a medication(s) that can suppress your immune system.  The following immunizations are recommended: Flu annually Covid-19  RSV Td/Tdap (tetanus, diphtheria, pertussis) every 10 years Pneumonia (Prevnar 15 then Pneumovax 23 at least 1 year apart.  Alternatively, can take Prevnar 20 without needing additional dose) Shingrix: 2 doses from 4 weeks to 6 months apart  Please check with your PCP to make sure you are up to date.

## 2022-05-12 LAB — COMPLETE METABOLIC PANEL WITH GFR
AG Ratio: 1.6 (calc) (ref 1.0–2.5)
ALT: 9 U/L (ref 6–29)
AST: 16 U/L (ref 10–35)
Albumin: 4.2 g/dL (ref 3.6–5.1)
Alkaline phosphatase (APISO): 88 U/L (ref 37–153)
BUN: 11 mg/dL (ref 7–25)
CO2: 24 mmol/L (ref 20–32)
Calcium: 10.2 mg/dL (ref 8.6–10.4)
Chloride: 105 mmol/L (ref 98–110)
Creat: 0.65 mg/dL (ref 0.50–1.05)
Globulin: 2.7 g/dL (calc) (ref 1.9–3.7)
Glucose, Bld: 75 mg/dL (ref 65–99)
Potassium: 4.7 mmol/L (ref 3.5–5.3)
Sodium: 141 mmol/L (ref 135–146)
Total Bilirubin: 0.3 mg/dL (ref 0.2–1.2)
Total Protein: 6.9 g/dL (ref 6.1–8.1)
eGFR: 96 mL/min/{1.73_m2} (ref >=60)

## 2022-05-12 LAB — CYCLIC CITRUL PEPTIDE ANTIBODY, IGG: Cyclic Citrullin Peptide Ab: 61 UNITS — ABNORMAL HIGH

## 2022-05-12 LAB — CBC WITH DIFFERENTIAL/PLATELET
Absolute Monocytes: 689 cells/uL (ref 200–950)
Basophils Absolute: 57 cells/uL (ref 0–200)
Basophils Relative: 0.7 %
Eosinophils Absolute: 308 cells/uL (ref 15–500)
Eosinophils Relative: 3.8 %
HCT: 44 % (ref 35.0–45.0)
Hemoglobin: 14.4 g/dL (ref 11.7–15.5)
Lymphs Abs: 1531 cells/uL (ref 850–3900)
MCH: 27.3 pg (ref 27.0–33.0)
MCHC: 32.7 g/dL (ref 32.0–36.0)
MCV: 83.3 fL (ref 80.0–100.0)
MPV: 8.8 fL (ref 7.5–12.5)
Monocytes Relative: 8.5 %
Neutro Abs: 5516 cells/uL (ref 1500–7800)
Neutrophils Relative %: 68.1 %
Platelets: 390 10*3/uL (ref 140–400)
RBC: 5.28 10*6/uL — ABNORMAL HIGH (ref 3.80–5.10)
RDW: 14.6 % (ref 11.0–15.0)
Total Lymphocyte: 18.9 %
WBC: 8.1 10*3/uL (ref 3.8–10.8)

## 2022-05-12 LAB — ANA: Anti Nuclear Antibody (ANA): NEGATIVE

## 2022-05-12 LAB — RHEUMATOID FACTOR: Rheumatoid fact SerPl-aCnc: 43 IU/mL — ABNORMAL HIGH (ref <14)

## 2022-05-12 LAB — SEDIMENTATION RATE: Sed Rate: 17 mm/h (ref 0–30)

## 2022-05-13 NOTE — Progress Notes (Signed)
Positive rheumatoid factor and positive CCP consistent with rheumatoid arthritis.  Rest of the labs are within normal limits.  I will discuss results at the follow-up visit.

## 2022-05-14 ENCOUNTER — Other Ambulatory Visit (HOSPITAL_BASED_OUTPATIENT_CLINIC_OR_DEPARTMENT_OTHER): Payer: Self-pay

## 2022-05-14 ENCOUNTER — Other Ambulatory Visit: Payer: Self-pay | Admitting: Family Medicine

## 2022-05-14 DIAGNOSIS — R928 Other abnormal and inconclusive findings on diagnostic imaging of breast: Secondary | ICD-10-CM

## 2022-05-14 DIAGNOSIS — E2839 Other primary ovarian failure: Secondary | ICD-10-CM

## 2022-05-21 ENCOUNTER — Encounter: Payer: Self-pay | Admitting: Family Medicine

## 2022-05-22 ENCOUNTER — Encounter: Payer: Self-pay | Admitting: Family Medicine

## 2022-05-22 NOTE — Progress Notes (Signed)
Office Visit Note  Patient: Linda Wood             Date of Birth: 08/12/1955           MRN: [id number]             PCP: Shelda Pal, DO Referring: Shelda Pal* Visit Date: 06/04/2022 Occupation: '@GUAROCC'$ @  Subjective:  Medication management  History of Present Illness: Sarie Stall is a 67 y.o. female with history of seropositive rheumatoid arthritis and osteoarthritis.  She had been taking hydroxychloroquine 200 mg p.o. daily.  She denies any increased joint pain or joint swelling.  She continues to have some morning stiffness.  She is off and on discomfort in her knee joints which are currently not hurting.  She wanted to review her lab results from the last visit today.    Activities of Daily Living:  Patient reports morning stiffness for 10 minutes.   Patient Denies nocturnal pain.  Difficulty dressing/grooming: Denies Difficulty climbing stairs: Denies Difficulty getting out of chair: Denies Difficulty using hands for taps, buttons, cutlery, and/or writing: Denies  Review of Systems  Constitutional: Negative.  Negative for fatigue.  HENT: Negative.  Negative for mouth sores and mouth dryness.   Eyes: Negative.  Negative for dryness.  Respiratory: Negative.  Negative for shortness of breath.   Cardiovascular: Negative.  Negative for chest pain and palpitations.  Gastrointestinal:  Positive for diarrhea. Negative for blood in stool and constipation.  Endocrine: Negative.  Negative for increased urination.  Genitourinary: Negative.  Negative for involuntary urination.  Musculoskeletal: Negative.  Negative for joint pain, gait problem, joint pain, joint swelling, myalgias, muscle weakness, morning stiffness, muscle tenderness and myalgias.  Skin: Negative.  Negative for color change, rash, hair loss and sensitivity to sunlight.  Allergic/Immunologic: Negative.  Negative for susceptible to infections.  Neurological: Negative.  Negative for  dizziness and headaches.  Hematological: Negative.  Negative for swollen glands.  Psychiatric/Behavioral: Negative.  Negative for depressed mood and sleep disturbance. The patient is not nervous/anxious.     PMFS History:  Patient Active Problem List   Diagnosis Date Noted   Psychophysiological insomnia 11/28/2021   Rheumatoid arthritis ([place]) 10/29/2021   Essential hypertension 10/29/2021   Depression, major, single episode, complete remission ([place]) 10/29/2021   Rib fractures 10/15/2021    Past Medical History:  Diagnosis Date   Depression    Hypertension    Rheumatoid arthritis ([place])     Family History  Problem Relation Age of Onset   Heart failure Mother    Breast cancer Mother 21   Past Surgical History:  Procedure Laterality Date   GASTRIC BYPASS     LASIK     Social History   Social History Narrative   Not on file   Immunization History  Administered Date(s) Administered   Fluad Quad(high Dose 65+) 05/03/2022   Moderna Sars-Covid-2 Vaccination 05/02/2020   PFIZER(Purple Top)SARS-COV-2 Vaccination 07/11/2019, 08/01/2019   PNEUMOCOCCAL CONJUGATE-20 10/04/2020   Zoster Recombinat (Shingrix) 10/06/2017, 12/07/2019, 02/25/2020     Objective: Vital Signs: BP (!) 169/93 (BP Location: Left Arm, Patient Position: Sitting, Cuff Size: Normal)   Pulse 62   Ht '5\' 2"'$  (1.575 m)   Wt 155 lb (70.3 kg)   BMI 28.35 kg/m    Physical Exam Vitals and nursing note reviewed.  Constitutional:      Appearance: She is well-developed.  HENT:     Head: Normocephalic and atraumatic.  Eyes:     Conjunctiva/sclera: Conjunctivae normal.  Cardiovascular:     Rate and Rhythm: Normal rate and regular rhythm.     Heart sounds: Normal heart sounds.  Pulmonary:     Effort: Pulmonary effort is normal.     Breath sounds: Normal breath sounds.  Abdominal:     General: Bowel sounds are normal.     Palpations: Abdomen is soft.  Musculoskeletal:     Cervical back: Normal range of  motion.  Lymphadenopathy:     Cervical: No cervical adenopathy.  Skin:    General: Skin is warm and dry.     Capillary Refill: Capillary refill takes less than 2 seconds.  Neurological:     Mental Status: She is alert and oriented to person, place, and time.  Psychiatric:        Behavior: Behavior normal.      Musculoskeletal Exam: Cervical spine was in good range of motion.  Shoulder joints, elbow joints, wrist joints, MCPs PIPs and DIPs been good range of motion without any synovitis.  Hip joints and knee joints in good range of motion.  There was no tenderness over ankles or MTPs.  CDAI Exam: CDAI Score: -- Patient Global: 0 mm; Provider Global: 0 mm Swollen: --; Tender: -- Joint Exam 06/04/2022   No joint exam has been documented for this visit   There is currently no information documented on the homunculus. Go to the Rheumatology activity and complete the homunculus joint exam.  Investigation: No additional findings.  Imaging: XR KNEE 3 VIEW RIGHT  Result Date: 05/10/2022 Moderate medial compartment narrowing was noted.  Mild patellofemoral narrowing was noted.  No chondrocalcinosis was noted. Impression: These findings are consistent with moderate osteoarthritis and mild chondromalacia patella.  XR KNEE 3 VIEW LEFT  Result Date: 05/10/2022 Moderate medial compartment narrowing was noted.  Moderate patellofemoral narrowing was noted.  No chondrocalcinosis was noted. Impression: These findings are consistent with moderate osteoarthritis and moderate chondromalacia patella.  XR Foot 2 Views Left  Result Date: 05/10/2022 PIP and DIP narrowing was noted.  Generalized osteopenia was noted.  No MTP, intertarsal, tibiotalar or subtalar joint space narrowing was noted.  No erosive changes were noted.  Inferior calcaneal spur was noted. Impression: These findings are consistent with osteoarthritis of the foot.  XR Foot 2 Views Right  Result Date: 05/10/2022 PIP and DIP  narrowing was noted.  Generalized osteopenia was noted.  No MTP, intertarsal, tibiotalar or subtalar joint space narrowing was noted.  No erosive changes were noted.  Inferior calcaneal spur was noted. Impression: These findings are consistent with osteoarthritis of the foot.  XR Hand 2 View Left  Result Date: 05/10/2022 CMC, PIP and DIP narrowing was noted.  No MCP, intercarpal or radiocarpal joint space narrowing.  No erosive changes were noted. Impression: These findings are consistent with osteoarthritis of the hand.  XR Hand 2 View Right  Result Date: 05/10/2022 CMC, PIP and DIP narrowing was noted.  No MCP, intercarpal or radiocarpal joint space narrowing.  No erosive changes were noted. Impression: These findings are consistent with osteoarthritis of the hand.   Recent Labs: Lab Results  Component Value Date   WBC 8.1 05/10/2022   HGB 14.4 05/10/2022   PLT 390 05/10/2022   NA 141 05/10/2022   K 4.7 05/10/2022   CL 105 05/10/2022   CO2 24 05/10/2022   GLUCOSE 75 05/10/2022   BUN 11 05/10/2022   CREATININE 0.65 05/10/2022   BILITOT 0.3 05/10/2022   AST 16 05/10/2022   ALT 9 05/10/2022  PROT 6.9 05/10/2022   CALCIUM 10.2 05/10/2022   May 10, 2022 ESR 17, RF 43, anti-CCP 61, ANA negative  Speciality Comments: Hydroxychloroquine 200 mg p.o. daily since 2011.  Eye examination December 2023 by Dr. Herbert Deaner per patient  Procedures:  No procedures performed Allergies: Dextromethorphan and Promethazine   Assessment / Plan:     Visit Diagnoses: Rheumatoid arthritis involving multiple sites, unspecified whether rheumatoid factor present ([place]) - +RF, +anti-CCP. Dxd in [place].Ttd with PLQ 200 mg po qd. she still has intermittent discomfort in her joints but no joint swelling.  She has been tolerating hydroxychloroquine without any side effects.  She has stiffness in her knee joints.  Labs obtained on May 10, 2022 were reviewed.  Rheumatoid factor was positive at 43 and  anti-CCP 61.  Sed rate was 17.  ANA was negative.  Significance of positive rheumatoid factor and anti-CCP antibodies were discussed.  Patient denies any history of iritis or shortness of breath.  Association with iritis and ILD was also discussed.  High risk medication use - Hydroxychloroquine 200 mg p.o. daily since 2011.  Eye examination December 2023 by Dr. Herbert Deaner.  She will get annual eye examination to monitor for ocular toxicity.  Patient was advised to get repeat labs in 4 months.  Information regarding immunization was placed in the AVS.  Primary osteoarthritis of both hands - History of discomfort in bilateral hands.   X-rays were consistent with osteoarthritis.  X-ray findings were discussed with the patient.  Primary osteoarthritis of both knees - History of pain in bilateral knee joints.  No synovitis was noted.  X-rays showed moderate chondromalacia patella and moderate osteoarthritis.  X-ray findings were discussed with the patient.  Lower extremity muscle strengthening exercises were discussed.  Patient has joined the gym but has not been to the gym yet.  Pain in both feet - History of pain in bilateral feet.  X-rays showed osteoarthritic changes.  X-ray findings were discussed with the patient.  Postural kyphosis of thoracic region  Essential hypertension-blood pressure was elevated at 148/88.  Repeat blood pressure was 169/93.  Patient was advised to monitor blood pressure closely and follow-up with her PCP.  Increased risk of heart disease with autoimmune disease was discussed.  Dietary modifications and exercise were emphasized.  Depression, major, single episode, complete remission ([facility])-she is on Lexapro.  Psychophysiological insomnia-she takes trazodone at bedtime.  Osteoporosis screening - DEXA results are pending.  Patient plans to go for DEXA scan this month.  Will discuss results at the follow-up visit.  Orders: No orders of the defined types were placed in this  encounter.  No orders of the defined types were placed in this encounter.    Follow-Up Instructions: Return in about 4 months (around 10/03/2022) for Rheumatoid arthritis, Osteoarthritis.   Bo Merino, MD  Note - This record has been created using Editor, commissioning.  Chart creation errors have been sought, but may not always  have been located. Such creation errors do not reflect on  the standard of medical care.

## 2022-06-04 ENCOUNTER — Ambulatory Visit: Payer: Medicare HMO | Attending: Rheumatology | Admitting: Rheumatology

## 2022-06-04 ENCOUNTER — Encounter: Payer: Self-pay | Admitting: Rheumatology

## 2022-06-04 VITALS — BP 169/93 | HR 62 | Ht 62.0 in | Wt 155.0 lb

## 2022-06-04 DIAGNOSIS — M170 Bilateral primary osteoarthritis of knee: Secondary | ICD-10-CM

## 2022-06-04 DIAGNOSIS — Z79899 Other long term (current) drug therapy: Secondary | ICD-10-CM | POA: Diagnosis not present

## 2022-06-04 DIAGNOSIS — M79671 Pain in right foot: Secondary | ICD-10-CM

## 2022-06-04 DIAGNOSIS — I10 Essential (primary) hypertension: Secondary | ICD-10-CM

## 2022-06-04 DIAGNOSIS — M4004 Postural kyphosis, thoracic region: Secondary | ICD-10-CM | POA: Diagnosis not present

## 2022-06-04 DIAGNOSIS — F325 Major depressive disorder, single episode, in full remission: Secondary | ICD-10-CM

## 2022-06-04 DIAGNOSIS — M79672 Pain in left foot: Secondary | ICD-10-CM

## 2022-06-04 DIAGNOSIS — M19041 Primary osteoarthritis, right hand: Secondary | ICD-10-CM | POA: Diagnosis not present

## 2022-06-04 DIAGNOSIS — M069 Rheumatoid arthritis, unspecified: Secondary | ICD-10-CM

## 2022-06-04 DIAGNOSIS — Z13820 Encounter for screening for osteoporosis: Secondary | ICD-10-CM

## 2022-06-04 DIAGNOSIS — M19042 Primary osteoarthritis, left hand: Secondary | ICD-10-CM

## 2022-06-04 DIAGNOSIS — F5104 Psychophysiologic insomnia: Secondary | ICD-10-CM | POA: Diagnosis not present

## 2022-06-04 NOTE — Patient Instructions (Addendum)
Vaccines You are taking a medication(s) that can suppress your immune system.  The following immunizations are recommended: Flu annually Covid-19  RSV Td/Tdap (tetanus, diphtheria, pertussis) every 10 years Pneumonia (Prevnar 15 then Pneumovax 23 at least 1 year apart.  Alternatively, can take Prevnar 20 without needing additional dose) Shingrix: 2 doses from 4 weeks to 6 months apart  Please check with your PCP to make sure you are up to date.   Standing Labs We placed an order today for your standing lab work.   Please have your standing labs drawn in June  Please have your labs drawn 2 weeks prior to your appointment so that the provider can discuss your lab results at your appointment.  Please note that you may see your imaging and lab results in [facility] before we have reviewed them. We will contact you once all results are reviewed. Please allow our office up to 72 hours to thoroughly review all of the results before contacting the office for clarification of your results.  Lab hours are:   Monday through Thursday from 8:00 am -12:30 pm and 1:00 pm-5:00 pm and Friday from 8:00 am-12:00 pm.  Please be advised, all patients with office appointments requiring lab work will take precedent over walk-in lab work.   Labs are drawn by [facility]. Please bring your co-pay at the time of your lab draw.  You may receive a bill from [facility] for your lab work.  Please note if you are on Hydroxychloroquine and and an order has been placed for a Hydroxychloroquine level, you will need to have it drawn 4 hours or more after your last dose.  If you wish to have your labs drawn at another location, please call the office 24 hours in advance so we can fax the orders.  The office is located at [address], [place], [place], [place] [address] No appointment is necessary.    If you have any questions regarding directions or hours of operation,  please call [phone].   As a reminder, please  drink plenty of water prior to coming for your lab work. Thanks!   Heart Disease Prevention   Your inflammatory disease increases your risk of heart disease which includes heart attack, stroke, atrial fibrillation (irregular heartbeats), high blood pressure, heart failure and atherosclerosis (plaque in the arteries).  It is important to reduce your risk by:   Keep blood pressure, cholesterol, and blood sugar at healthy levels   Smoking Cessation   Maintain a healthy weight  BMI 20-25   Eat a healthy diet  Plenty of fresh fruit, vegetables, and whole grains  Limit saturated fats, foods high in sodium, and added sugars  DASH and Mediterranean diet   Increase physical activity  Recommend moderate physically activity for 150 minutes per week/ 30 minutes a day for five days a week These can be broken up into three separate ten-minute sessions during the day.   Reduce Stress  Meditation, slow breathing exercises, yoga, coloring books  Dental visits twice a year

## 2022-06-05 ENCOUNTER — Other Ambulatory Visit (HOSPITAL_BASED_OUTPATIENT_CLINIC_OR_DEPARTMENT_OTHER): Payer: Self-pay

## 2022-06-11 ENCOUNTER — Other Ambulatory Visit: Payer: Self-pay | Admitting: Family Medicine

## 2022-06-11 ENCOUNTER — Ambulatory Visit
Admission: RE | Admit: 2022-06-11 | Discharge: 2022-06-11 | Disposition: A | Discharge status: Home / Self Care | Payer: Medicare HMO | Source: Ambulatory Visit | Attending: Family Medicine | Admitting: Family Medicine

## 2022-06-11 DIAGNOSIS — R928 Other abnormal and inconclusive findings on diagnostic imaging of breast: Secondary | ICD-10-CM

## 2022-06-11 DIAGNOSIS — E2839 Other primary ovarian failure: Secondary | ICD-10-CM

## 2022-06-11 DIAGNOSIS — R921 Mammographic calcification found on diagnostic imaging of breast: Secondary | ICD-10-CM | POA: Diagnosis not present

## 2022-06-20 ENCOUNTER — Ambulatory Visit
Admission: RE | Admit: 2022-06-20 | Discharge: 2022-06-20 | Disposition: A | Discharge status: Home / Self Care | Payer: Medicare HMO | Source: Ambulatory Visit | Attending: Family Medicine | Admitting: Family Medicine

## 2022-06-20 DIAGNOSIS — N6321 Unspecified lump in the left breast, upper outer quadrant: Secondary | ICD-10-CM | POA: Diagnosis not present

## 2022-06-20 DIAGNOSIS — R928 Other abnormal and inconclusive findings on diagnostic imaging of breast: Secondary | ICD-10-CM

## 2022-06-20 DIAGNOSIS — C50412 Malignant neoplasm of upper-outer quadrant of left female breast: Secondary | ICD-10-CM | POA: Diagnosis not present

## 2022-06-20 DIAGNOSIS — R921 Mammographic calcification found on diagnostic imaging of breast: Secondary | ICD-10-CM | POA: Diagnosis not present

## 2022-06-20 DIAGNOSIS — Z170 Estrogen receptor positive status [ER+]: Secondary | ICD-10-CM | POA: Diagnosis not present

## 2022-06-20 HISTORY — PX: BREAST BIOPSY: SHX20

## 2022-06-21 ENCOUNTER — Telehealth: Payer: Self-pay | Admitting: Hematology and Oncology

## 2022-06-21 NOTE — Telephone Encounter (Signed)
Spoke to patient to confirm upcoming morning [facility] clinic appointment on 2/28, paperwork will be sent via e-mail.   Gave location and time, also informed patient that the surgeon's office would be calling as well to get information from them similar to the packet that they will be receiving so make sure to do both.  Reminded patient that all providers will be coming to the clinic to see them HERE and if they had any questions to not hesitate to reach back out to myself or their navigators.

## 2022-06-24 ENCOUNTER — Encounter: Payer: Self-pay | Admitting: *Deleted

## 2022-06-24 ENCOUNTER — Ambulatory Visit: Payer: Self-pay | Admitting: Surgery

## 2022-06-24 DIAGNOSIS — C50912 Malignant neoplasm of unspecified site of left female breast: Secondary | ICD-10-CM

## 2022-06-25 ENCOUNTER — Other Ambulatory Visit: Payer: Self-pay | Admitting: *Deleted

## 2022-06-25 DIAGNOSIS — Z170 Estrogen receptor positive status [ER+]: Secondary | ICD-10-CM

## 2022-06-26 ENCOUNTER — Encounter: Payer: Self-pay | Admitting: Hematology and Oncology

## 2022-06-26 ENCOUNTER — Other Ambulatory Visit: Payer: Self-pay

## 2022-06-26 ENCOUNTER — Inpatient Hospital Stay: Payer: Medicare HMO | Admitting: Licensed Clinical Social Worker

## 2022-06-26 ENCOUNTER — Inpatient Hospital Stay: Payer: Medicare HMO | Attending: Hematology and Oncology | Admitting: Hematology and Oncology

## 2022-06-26 ENCOUNTER — Ambulatory Visit: Payer: Self-pay | Admitting: Surgery

## 2022-06-26 ENCOUNTER — Encounter: Payer: Self-pay | Admitting: Physical Therapy

## 2022-06-26 ENCOUNTER — Inpatient Hospital Stay: Payer: Medicare HMO

## 2022-06-26 ENCOUNTER — Ambulatory Visit
Admission: RE | Admit: 2022-06-26 | Discharge: 2022-06-26 | Disposition: A | Discharge status: Home / Self Care | Payer: Medicare HMO | Source: Ambulatory Visit | Attending: Radiation Oncology | Admitting: Radiation Oncology

## 2022-06-26 ENCOUNTER — Ambulatory Visit: Payer: Medicare HMO | Attending: Surgery | Admitting: Physical Therapy

## 2022-06-26 ENCOUNTER — Inpatient Hospital Stay (HOSPITAL_BASED_OUTPATIENT_CLINIC_OR_DEPARTMENT_OTHER): Payer: Medicare HMO | Admitting: Genetic Counselor

## 2022-06-26 VITALS — BP 149/78 | HR 72 | Temp 97.7°F | Resp 16 | Ht 62.0 in | Wt 155.5 lb

## 2022-06-26 DIAGNOSIS — I10 Essential (primary) hypertension: Secondary | ICD-10-CM | POA: Diagnosis not present

## 2022-06-26 DIAGNOSIS — Z170 Estrogen receptor positive status [ER+]: Secondary | ICD-10-CM

## 2022-06-26 DIAGNOSIS — Z79899 Other long term (current) drug therapy: Secondary | ICD-10-CM | POA: Diagnosis not present

## 2022-06-26 DIAGNOSIS — R293 Abnormal posture: Secondary | ICD-10-CM | POA: Insufficient documentation

## 2022-06-26 DIAGNOSIS — C50412 Malignant neoplasm of upper-outer quadrant of left female breast: Secondary | ICD-10-CM

## 2022-06-26 DIAGNOSIS — Z803 Family history of malignant neoplasm of breast: Secondary | ICD-10-CM | POA: Insufficient documentation

## 2022-06-26 DIAGNOSIS — M069 Rheumatoid arthritis, unspecified: Secondary | ICD-10-CM | POA: Insufficient documentation

## 2022-06-26 DIAGNOSIS — Z853 Personal history of malignant neoplasm of breast: Secondary | ICD-10-CM | POA: Diagnosis not present

## 2022-06-26 DIAGNOSIS — Z9884 Bariatric surgery status: Secondary | ICD-10-CM | POA: Diagnosis not present

## 2022-06-26 DIAGNOSIS — C50912 Malignant neoplasm of unspecified site of left female breast: Secondary | ICD-10-CM

## 2022-06-26 LAB — CMP (CANCER CENTER ONLY)
ALT: 10 U/L (ref 0–44)
AST: 13 U/L — ABNORMAL LOW (ref 15–41)
Albumin: 3.8 g/dL (ref 3.5–5.0)
Alkaline Phosphatase: 73 U/L (ref 38–126)
Anion gap: 5 (ref 5–15)
BUN: 15 mg/dL (ref 8–23)
CO2: 29 mmol/L (ref 22–32)
Calcium: 9.8 mg/dL (ref 8.9–10.3)
Chloride: 107 mmol/L (ref 98–111)
Creatinine: 0.59 mg/dL (ref 0.44–1.00)
GFR, Estimated: >60 mL/min (ref >60)
Glucose, Bld: 66 mg/dL — ABNORMAL LOW (ref 70–99)
Potassium: 4.1 mmol/L (ref 3.5–5.1)
Sodium: 141 mmol/L (ref 135–145)
Total Bilirubin: 0.3 mg/dL (ref 0.3–1.2)
Total Protein: 6.1 g/dL — ABNORMAL LOW (ref 6.5–8.1)

## 2022-06-26 LAB — CBC WITH DIFFERENTIAL (CANCER CENTER ONLY)
Abs Immature Granulocytes: 0.01 10*3/uL (ref 0.00–0.07)
Basophils Absolute: 0.1 10*3/uL (ref 0.0–0.1)
Basophils Relative: 1 %
Eosinophils Absolute: 0.4 10*3/uL (ref 0.0–0.5)
Eosinophils Relative: 6 %
HCT: 39.6 % (ref 36.0–46.0)
Hemoglobin: 12.8 g/dL (ref 12.0–15.0)
Immature Granulocytes: 0 %
Lymphocytes Relative: 30 %
Lymphs Abs: 2 10*3/uL (ref 0.7–4.0)
MCH: 28 pg (ref 26.0–34.0)
MCHC: 32.3 g/dL (ref 30.0–36.0)
MCV: 86.7 fL (ref 80.0–100.0)
Monocytes Absolute: 0.7 10*3/uL (ref 0.1–1.0)
Monocytes Relative: 11 %
Neutro Abs: 3.5 10*3/uL (ref 1.7–7.7)
Neutrophils Relative %: 52 %
Platelet Count: 320 10*3/uL (ref 150–400)
RBC: 4.57 MIL/uL (ref 3.87–5.11)
RDW: 15.4 % (ref 11.5–15.5)
WBC Count: 6.6 10*3/uL (ref 4.0–10.5)
nRBC: 0 % (ref 0.0–0.2)

## 2022-06-26 LAB — GENETIC SCREENING ORDER

## 2022-06-26 NOTE — Progress Notes (Signed)
Radiation Oncology         [phone] ________________________________  Name: Linda Wood        MRN: [id number]  Date of Service: 06/26/2022 DOB: [birth year]  [id number], Crosby Oyster, DO  Donnie Mesa, MD     REFERRING PHYSICIAN: Donnie Mesa, MD   DIAGNOSIS: The encounter diagnosis was Malignant neoplasm of upper-outer quadrant of left breast in female, estrogen receptor positive ([place]).   HISTORY OF PRESENT ILLNESS: Linda Wood is a 67 y.o. female seen in the multidisciplinary breast clinic for a new diagnosis of left breast cancer. The patient was noted to have calcifications and mass in previous mammogram and returned for delayed follow up. Imaging showed a mass and calcifications in the upper outer quadranat of the left breast. By ultrasound the mass was in the 1:00 position measuring 7 mm with no adenoapthy. The other area of calcifications was biopsied as well under stereotactic mammogram imaging. 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 She's seen today to discuss treatment recommendations for her cancer.    PREVIOUS RADIATION THERAPY: No   PAST MEDICAL HISTORY:  Past Medical History:  Diagnosis Date   Depression    Hypertension    Rheumatoid arthritis ([place])        PAST SURGICAL HISTORY: Past Surgical History:  Procedure Laterality Date   BREAST BIOPSY Left 06/20/2022   [place] LT BREAST BX W LOC DEV 1ST LESION IMG BX SPEC US GUIDE 06/20/2022 [facility]   BREAST BIOPSY Left 06/20/2022   MM LT BREAST BX W LOC DEV 1ST LESION IMAGE BX SPEC STEREO GUIDE 06/20/2022 [facility]   GASTRIC BYPASS     LASIK       FAMILY HISTORY:  Family History  Problem Relation Age of Onset   Heart failure Mother    Breast cancer Mother 3     SOCIAL HISTORY:  reports that she has  never smoked. She has never used smokeless tobacco. She reports current alcohol use. She reports that she does not use drugs. She is married and lives in [place]. 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 She and her husband relocated within the last year from [place], [place]. Her daughter Maudie Mercury is an ICU nurse and listens in during our call. She also has a 47 month old granddaughter, Scout who she enjoys seeing. She also enjoys needlepoint, and reading.    ALLERGIES: Dextromethorphan and Promethazine   MEDICATIONS:  Current Outpatient Medications  Medication Sig Dispense Refill   acetaminophen (TYLENOL) 160 MG/5ML solution Take 31.3 mLs (1,000 mg total) by mouth every 6 (six) hours as needed for mild pain. (Patient not taking: Reported on 05/10/2022) 120 mL 0   ergocalciferol (VITAMIN D2) 1.25 MG (50000 UT) capsule Take 50,000 Units by mouth every Saturday.     escitalopram (LEXAPRO) 20 MG tablet Take 1 tablet (20 mg total) by mouth daily. 90 tablet 2   fluticasone (FLONASE) 50 MCG/ACT nasal spray Place 2 sprays into both nostrils daily. (Patient not taking: Reported on 05/10/2022) 16 g 6   hydroxychloroquine (PLAQUENIL) 200 MG tablet Take 1 tablet (200 mg total) by mouth in the morning. 90 tablet 0   losartan (COZAAR) 50 MG tablet TAKE 1 TABLET (50 MG TOTAL) BY MOUTH IN THE MORNING 30 tablet 1  traZODone (DESYREL) 50 MG tablet Take 0.5-1 tablets (25-50 mg total) by mouth at bedtime as needed for sleep. 90 tablet 2   No current facility-administered medications for this visit.     REVIEW OF SYSTEMS: On review of systems, the patient reports that she is doing well overall. She was very concerned about her treatment discussions given her mother's history. She is feeling a little more at ease now that we've discussed her treatment options.      PHYSICAL EXAM:  Wt Readings from Last 3 Encounters:  06/04/22 155 lb (70.3 kg)  05/10/22 157 lb (71.2 kg)  05/03/22 158 lb  (71.7 kg)   Temp Readings from Last 3 Encounters:  05/03/22 98 F (36.7 C) (Oral)  10/29/21 98.2 F (36.8 C) (Oral)  10/17/21 (!) 97.5 F (36.4 C)   BP Readings from Last 3 Encounters:  06/04/22 (!) 169/93  05/10/22 129/84  05/03/22 120/82   Pulse Readings from Last 3 Encounters:  06/04/22 62  05/10/22 71  05/03/22 73    In general this is a well appearing caucasian female in no acute distress. She's alert and oriented x4 and appropriate throughout the examination. Cardiopulmonary assessment is negative for acute distress and she exhibits normal effort. Bilateral breast exam is deferred.    ECOG = 1  0 - Asymptomatic (Fully active, able to carry on all predisease activities without restriction)  1 - Symptomatic but completely ambulatory (Restricted in physically strenuous activity but ambulatory and able to carry out work of a light or sedentary nature. For example, light housework, office work)  2 - Symptomatic, <50% in bed during the day (Ambulatory and capable of all self care but unable to carry out any work activities. Up and about more than 50% of waking hours)  3 - Symptomatic, >50% in bed, but not bedbound (Capable of only limited self-care, confined to bed or chair 50% or more of waking hours)  4 - Bedbound (Completely disabled. Cannot carry on any self-care. Totally confined to bed or chair)  5 - Death   Eustace Pen MM, Creech RH, Tormey DC, et al. [phone]). "Toxicity and response criteria of the [facility]". [place] Oncol. 5 (6): 649-55    LABORATORY DATA:  Lab Results  Component Value Date   WBC 8.1 05/10/2022   HGB 14.4 05/10/2022   HCT 44.0 05/10/2022   MCV 83.3 05/10/2022   PLT 390 05/10/2022   Lab Results  Component Value Date   NA 141 05/10/2022   K 4.7 05/10/2022   CL 105 05/10/2022   CO2 24 05/10/2022   Lab Results  Component Value Date   ALT 9 05/10/2022   AST 16 05/10/2022   BILITOT 0.3 05/10/2022       RADIOGRAPHY: MM LT BREAST BX W LOC DEV 1ST LESION IMAGE BX SPEC STEREO GUIDE  Addendum Date: 06/25/2022   ADDENDUM REPORT: 06/25/2022 08:20 ADDENDUM: Pathology revealed GRADE II INVASIVE DUCTAL CARCINOMA of the LEFT breast, 1 o'clock, 10 cmfn, (ribbon shaped clip). This was found to be concordant by Dr. Franki Cabot. 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 This was found to be concordant by Dr. Franki Cabot. Pathology results were discussed with the patient by telephone. The patient  reported doing well after the biopsies with tenderness at the sites. NO additional bleeding noted. Post biopsy instructions and care were reviewed and questions were answered. The patient was encouraged to call [facility] for any additional concerns. My direct phone number was provided. The patient was referred to [facility] on June 26, 2022. Pathology results reported by Terie Purser, RN on 06/25/2022. Electronically Signed   By: Franki Cabot M.D.   On: 06/25/2022 08:20   Result Date: 06/25/2022 CLINICAL DATA:  Patient with indeterminate calcifications in the upper-outer quadrant of the LEFT breast presents today for stereotactic biopsy. EXAM: LEFT BREAST STEREOTACTIC CORE NEEDLE BIOPSY COMPARISON:  Previous exam(s). FINDINGS: The patient and I discussed the procedure of stereotactic-guided biopsy including benefits and alternatives. We discussed the high likelihood of a successful procedure. We discussed the risks of the procedure including infection, bleeding, tissue injury, clip migration, and inadequate sampling.  Informed written consent was given. The usual time out protocol was performed immediately prior to the procedure. Using sterile technique and 1% Lidocaine as local anesthetic, under stereotactic guidance, a 9 gauge vacuum assisted device was used to perform core needle biopsy of calcifications in the upper outer quadrant of the left breast using a superior approach. Specimen radiograph was performed showing calcifications. Specimens with calcifications are identified for pathology. Lesion quadrant: Upper outer quadrant At the conclusion of the procedure, a coil shaped tissue marker clip was deployed into the biopsy cavity. Follow-up 2-view mammogram was performed and dictated separately. IMPRESSION: Stereotactic-guided biopsy of indeterminate calcifications within the upper-outer quadrant of the LEFT breast, with a coil shaped clip placed at the biopsy site. No apparent complications. Electronically Signed: By: Franki Cabot M.D. On: 06/20/2022 08:55   [place] LT BREAST BX W LOC DEV 1ST LESION IMG BX SPEC US GUIDE  Addendum Date: 06/25/2022   ADDENDUM REPORT: 06/25/2022 08:19 ADDENDUM: Pathology revealed GRADE II INVASIVE DUCTAL CARCINOMA of the LEFT breast, 1 o'clock, 10 cmfn, (ribbon shaped clip). This was found to be concordant by Dr. Franki Cabot. 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 This was found to be concordant by Dr. Franki Cabot. Pathology results were discussed with the patient by telephone. The patient reported doing well after the biopsies with tenderness at the sites. NO additional bleeding noted. Post biopsy instructions and care were reviewed and questions were answered. The patient was encouraged  to call [facility] for any additional concerns. My direct phone number was provided. The patient was referred to [facility] on June 26, 2022. Pathology results reported by Terie Purser, RN on 06/25/2022. Electronically Signed   By: Franki Cabot M.D.   On: 06/25/2022 08:19   Result Date: 06/25/2022 CLINICAL DATA:  Patient presents today for ultrasound-guided biopsy of a LEFT breast mass at the 1 o'clock axis. EXAM: ULTRASOUND GUIDED LEFT BREAST CORE NEEDLE BIOPSY COMPARISON:  Previous exam(s). PROCEDURE: I met with the patient and we discussed the procedure of ultrasound-guided biopsy, including benefits and alternatives. We discussed the high likelihood of a successful procedure. We discussed the risks of the procedure, including infection,  bleeding, tissue injury, clip migration, and inadequate sampling. Informed written consent was given. The usual time-out protocol was performed immediately prior to the procedure. Lesion quadrant: Upper outer quadrant Using sterile technique and 1% Lidocaine as local anesthetic, under direct ultrasound visualization, a 12 gauge spring-loaded device was used to perform biopsy of the LEFT breast mass at the 1 o'clock axis, 10 cm from the nipple, using a lateral approach. At the conclusion of the procedure , a ribbon shaped tissue marker clip was deployed into the biopsy cavity. Follow up 2 view mammogram was performed and dictated separately. IMPRESSION: Ultrasound guided biopsy of the LEFT breast mass at the 1 o'clock axis, 10 cm from the nipple, with ribbon shaped clip placed at the biopsy site. No apparent complications. Electronically Signed: By: Franki Cabot M.D. On: 06/20/2022 08:07   MM CLIP PLACEMENT LEFT  Result Date: 06/20/2022 CLINICAL DATA:  Status post ultrasound-guided biopsy of a LEFT breast mass at the 1 o'clock axis and stereotactic biopsy of  indeterminate calcifications in the upper-outer quadrant of the LEFT breast. EXAM: 3D DIAGNOSTIC LEFT MAMMOGRAM POST ULTRASOUND AND STEREOTACTIC BIOPSIES COMPARISON:  Previous exam(s). FINDINGS: 3D Mammographic images were obtained following ultrasound guided biopsy of the LEFT breast mass at the 1 o'clock axis and indeterminate calcifications in the upper-outer quadrant of the LEFT breast. The biopsy marking clips are in expected position at the sites of biopsy. IMPRESSION: 1. Appropriate positioning of the ribbon shaped biopsy marking clip at the site of biopsy in the upper-outer quadrant of the LEFT breast, at posterior depth, corresponding to the targeted mass at the 1 o'clock axis and 10 cm from the nipple. 2. Appropriate positioning of the coil shaped biopsy marking clip at the site of biopsy in the upper-outer quadrant of the LEFT breast corresponding to the targeted calcifications. 3. Fairly significant postprocedural bleeding after the stereotactic biopsy. Hemostasis was obtained with manual compression. Patient was instructed to use additional manual compression and apply the provided packets of ice if any further bleeding was noted after leaving our department. Final Assessment: Post Procedure Mammograms for Marker Placement Electronically Signed   By: Franki Cabot M.D.   On: 06/20/2022 09:11  US BREAST LTD UNI LEFT INC AXILLA  Result Date: 06/11/2022 CLINICAL DATA:  67 year old female presenting for annual bilateral mammogram and delayed follow-up of probably benign left breast calcifications. EXAM: DIGITAL DIAGNOSTIC BILATERAL MAMMOGRAM WITH CAD; ULTRASOUND LEFT BREAST LIMITED TECHNIQUE: Bilateral digital diagnostic mammography was performed.; Targeted ultrasound examination of the left breast was performed. COMPARISON:  Previous exam(s). ACR Breast Density Category c: The breasts are heterogeneously dense, which may obscure small masses. FINDINGS: Loosely grouped punctate and amorphous  calcifications in the upper outer left breast at mid to posterior depth appear to have slightly decreased when compared to prior comparison. There has been interval development of a spiculated hyperdense mass in the upper outer quadrant at posterior depth. No new or suspicious findings within the right breast. Targeted ultrasound is performed, showing an irregular, hypoechoic mass, taller than wide, at the 1 o'clock position 10 cm from the nipple. It measures 7 x 6 x 6 mm. There is no associated vascularity. This correlates with the mammographic finding. Evaluation of the left axilla demonstrates no suspicious lymphadenopathy. IMPRESSION: 1. Suspicious left breast mass at the 1 o'clock position. Recommend ultrasound-guided biopsy. 2. Indeterminate left breast calcifications. Recommend stereotactic biopsy. 3. No suspicious left axillary lymphadenopathy. 4. No mammographic evidence of malignancy on the right. RECOMMENDATION: Stereotactic and ultrasound-guided biopsies of  the left breast. I have discussed the findings and recommendations with the patient. If applicable, a reminder letter will be sent to the patient regarding the next appointment. BI-RADS CATEGORY  4: Suspicious. Electronically Signed   By: Kristopher Oppenheim M.D.   On: 06/11/2022 16:50  MM DIAG BREAST TOMO BILATERAL  Result Date: 06/11/2022 CLINICAL DATA:  67 year old female presenting for annual bilateral mammogram and delayed follow-up of probably benign left breast calcifications. EXAM: DIGITAL DIAGNOSTIC BILATERAL MAMMOGRAM WITH CAD; ULTRASOUND LEFT BREAST LIMITED TECHNIQUE: Bilateral digital diagnostic mammography was performed.; Targeted ultrasound examination of the left breast was performed. COMPARISON:  Previous exam(s). ACR Breast Density Category c: The breasts are heterogeneously dense, which may obscure small masses. FINDINGS: Loosely grouped punctate and amorphous calcifications in the upper outer left breast at mid to posterior depth  appear to have slightly decreased when compared to prior comparison. There has been interval development of a spiculated hyperdense mass in the upper outer quadrant at posterior depth. No new or suspicious findings within the right breast. Targeted ultrasound is performed, showing an irregular, hypoechoic mass, taller than wide, at the 1 o'clock position 10 cm from the nipple. It measures 7 x 6 x 6 mm. There is no associated vascularity. This correlates with the mammographic finding. Evaluation of the left axilla demonstrates no suspicious lymphadenopathy. IMPRESSION: 1. Suspicious left breast mass at the 1 o'clock position. Recommend ultrasound-guided biopsy. 2. Indeterminate left breast calcifications. Recommend stereotactic biopsy. 3. No suspicious left axillary lymphadenopathy. 4. No mammographic evidence of malignancy on the right. RECOMMENDATION: Stereotactic and ultrasound-guided biopsies of the left breast. I have discussed the findings and recommendations with the patient. If applicable, a reminder letter will be sent to the patient regarding the next appointment. BI-RADS CATEGORY  4: Suspicious. Electronically Signed   By: Kristopher Oppenheim M.D.   On: 06/11/2022 16:50      IMPRESSION/PLAN: 1. 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 Dr. Lisbeth Renshaw discusses the pathology findings and reviews the nature of left breast disease. The consensus from the breast conference includes breast conservation with lumpectomy with sentinel node biopsy. Depending on the size of the final tumor measurements rendered by pathology, the tumor may be tested for Oncotype Dx score to determine a role for systemic therapy. Provided that chemotherapy is not indicated, the patient's course would then be followed by external radiotherapy to the breast  to reduce risks of local  recurrence followed by antiestrogen therapy. We discussed the risks, benefits, short, and long term effects of radiotherapy, as well as the curative intent, and the patient is interested in proceeding. Dr. Lisbeth Renshaw discusses the delivery and logistics of radiotherapy and anticipates a course of 4 or up to 6 1/2  weeks of radiotherapy to the left breast with deep inspiration breath hold technique. We will see her back a few weeks after surgery to discuss the simulation process and anticipate we starting radiotherapy about 4-6 weeks after surgery.  2. Possible genetic predisposition to malignancy. The patient is a candidate for genetic testing given her personal and family history. She will meet with our geneticist today in clinic. 3. Rheumatoid Arthritis. The patient sees Dr. Estanislado Pandy and takes plaquenil. She can remain on this therapy. She's aware we would want to avoid methotrexate, however it seems that plaquenil will remain her course.    In a visit lasting  60 minutes, greater than 50% of the time was spent face to face reviewing her case, as well as in preparation of, discussing, and coordinating the patient's care.  The above documentation reflects my direct findings during this shared patient visit. Please see the separate note by Dr. Lisbeth Renshaw on this date for the remainder of the patient's plan of care.    Carola Rhine, [facility]    **Disclaimer: This note was dictated with voice recognition software. Similar sounding words can inadvertently be transcribed and this note may contain transcription errors which may not have been corrected upon publication of note.**

## 2022-06-26 NOTE — H&P (Signed)
Subjective    Chief Complaint: Breast Cancer PCP - [place]   Breast MDC-06/26/2022 [place]/ [place]   History of Present Illness: Linda Wood is a 67 y.o. female who is seen today as an office consultation at the request of Dr. Chryl Heck for evaluation of Breast Cancer .     This is a 67 year old female who recently underwent routine screening mammogram and follow-up of left breast calcifications.  She was found to have a mass located at 1:00 in the left breast, 10 cm from the nipple.  This measured 7 x 6 x 7 mm.  She also had some adjacent calcifications.  The mass was biopsied under ultrasound guidance.  The calcifications underwent stereotactic biopsy.   Pathology of the mass showed invasive ductal carcinoma grade 2, ER/PR positive, HER2 negative, Ki-67 10%.  The calcifications revealed invasive ductal carcinoma grade 2, ER positive, PR negative, HER2 negative, Ki-67 15%.  The patient presents now for discussion of her surgical options.  She had significant bruising after her biopsy.     Review of Systems: A complete review of systems was obtained from the patient.  I have reviewed this information and discussed as appropriate with the patient.  See HPI as well for other ROS.   Review of Systems  Constitutional: Negative.   HENT: Negative.    Eyes: Negative.   Respiratory: Negative.    Cardiovascular: Negative.   Gastrointestinal: Negative.   Genitourinary: Negative.   Musculoskeletal: Negative.   Skin: Negative.   Neurological: Negative.   Endo/Heme/Allergies: Negative.   Psychiatric/Behavioral: Negative.          Medical History: Past Medical History      Past Medical History:  Diagnosis Date   Anxiety     Arthritis     Hypertension             Patient Active Problem List  Diagnosis   Malignant neoplasm of upper-outer quadrant of left breast in female, estrogen receptor positive    Rheumatoid arthritis (CMS-HCC)   Essential hypertension      Past Surgical  History       Past Surgical History:  Procedure Laterality Date   LAPAROSCOPIC GASTRIC BYPASS   2007   Lasix Surgery        04-05?        Allergies      Allergies  Allergen Reactions   Dextromethorphan Anaphylaxis              Current Outpatient Medications on File Prior to Visit  Medication Sig Dispense Refill   ergocalciferol, vitamin D2, 1,250 mcg (50,000 unit) capsule Take by mouth       escitalopram oxalate (LEXAPRO) 20 MG tablet Take 20 mg by mouth once daily       hydroxychloroquine (PLAQUENIL) 200 mg tablet Take by mouth       losartan (COZAAR) 50 MG tablet Take by mouth       traZODone (DESYREL) 50 MG tablet Take by mouth        No current facility-administered medications on file prior to visit.      Family History       Family History  Problem Relation Age of Onset   Obesity Mother     High blood pressure (Hypertension) Mother     Coronary Artery Disease (Blocked arteries around heart) Mother     Breast cancer Mother     Colon cancer Father     Obesity Brother     High blood pressure (  Hypertension) Brother          Social History        Tobacco Use  Smoking Status Former   Types: Cigarettes  Smokeless Tobacco Never      Social History  Social History         Socioeconomic History   Marital status: Married  Tobacco Use   Smoking status: Former      Types: Cigarettes   Smokeless tobacco: Never  Vaping Use   Vaping Use: Never used  Substance and Sexual Activity   Alcohol use: Not Currently   Drug use: Never        Objective:      There were no vitals filed for this visit.  There is no height or weight on file to calculate BMI.   Physical Exam    Constitutional:  WDWN in NAD, conversant, no obvious deformities; lying in bed comfortably Eyes:  Pupils equal, round; sclera anicteric; moist conjunctiva; no lid lag HENT:  Oral mucosa moist; good dentition  Neck:  No masses palpated, trachea midline; no thyromegaly Lungs:  CTA  bilaterally; normal respiratory effort Breasts:  symmetric, no nipple changes; no palpable masses or lymphadenopathy on the right side.  The left side shows ecchymosis and hematoma in the lateral part of the breast.  No nipple discharge.  No axillary lymphadenopathy. CV:  Regular rate and rhythm; no murmurs; extremities well-perfused with no edema Abd:  +bowel sounds, soft, non-tender, no palpable organomegaly; no palpable hernias Musc: Normal gait; no apparent clubbing or cyanosis in extremities Lymphatic:  No palpable cervical or axillary lymphadenopathy Skin:  Warm, dry; no sign of jaundice Psychiatric - alert and oriented x 4; calm mood and affect     Labs, Imaging and Diagnostic Testing:   Diagnosis 1. Breast, left, needle core biopsy, 1 o'clock, 10 cmfn, ribbon shaped clip - INVASIVE DUCTAL CARCINOMA, SEE NOTE - TUBULE FORMATION: SCORE 3/3 - NUCLEAR PLEOMORPHISM: SCORE 2/3 - MITOTIC COUNT: SCORE 1/3 - TOTAL SCORE: 6/9 - OVERALL GRADE: II/III - LYMPHOVASCULAR INVASION: NOT IDENTIFIED - CANCER LENGTH: 6 MM IN GREATEST LINEAR DIMENSION ON FRAGMENTED CORES. - CALCIFICATIONS: NOT IDENTIFIED - OTHER FINDINGS: '[]'$  NOTE: DR. Vic Ripper REVIEWED THE CASE AND CONCURS WITH THE INTERPRETATION. A BREAST PROGNOSTIC PROFILE (ER, PR, KI-67 AND HER2) IS PENDING AND WILL BE REPORTED IN AN ADDENDUM. THE [facility] [place] WAS NOTIFIED ON 06/21/2022. 2. Breast, left, needle core biopsy, upper outer quadrant, coil shaped clip - INVASIVE DUCTAL CARCINOMA, SEE NOTE - DUCTAL CARCINOMA IN SITU, SOLID AND TYPE WITH SINGLE CELL NECROSIS, NUCLEAR GRADE 2 OF 3 1 of 5 FINAL for Halt, Norell [id number]) Diagnosis(continued) - TUBULE FORMATION: SCORE 3/3 - NUCLEAR PLEOMORPHISM: SCORE 2/3 - MITOTIC COUNT: SCORE 1/3 - TOTAL SCORE: 6/9 - OVERALL GRADE: II/III - LYMPHOVASCULAR INVASION: NOT IDENTIFIED - CANCER LENGTH: 3 MM IN GREATEST LINEAR DIMENSION - CALCIFICATIONS: PRESENT IN BOTH INVASIVE  AND DUCTAL CARCINOMA IN SITU - OTHER FINDINGS: FIBROADENOMATOUS CHANGE AND FIBROCYSTIC CHANGE NOTE: DR. Vic Ripper REVIEWED THE CASE AND CONCURS WITH THE INTERPRETATION. IT SHOULD BE NOTED THAT THE MORPHOLOGIES ARE SLIGHTLY DIFFERENT AND COULD REPRESENT SEPARATE TUMORS MORPHOLOGICALLY; THEREFORE, A BREAST PROGNOSTIC PROFILE (ER, PR, KI-67 AND HER2) IS PENDING AND WILL BE REPORTED IN AN ADDENDUM. THE [facility] WAS NOTIFIED ON 06/21/2022. Tilford Pillar DO Pathologist, Electronic Signature (Case signed 06/24/2022)   1. Breast, left, needle core biopsy, 1 o'clock, 10cmfn, ribbon shaped clip PROGNOSTIC INDICATORS Results: IMMUNOHISTOCHEMICAL AND MORPHOMETRIC ANALYSIS PERFORMED MANUALLY The  tumor cells are negative for Her2 (1+). Estrogen Receptor: 100%, POSITIVE, STRONG STAINING INTENSITY Progesterone Receptor: 20%, POSITIVE, MODERATE-STRONG STAINING INTENSITY Proliferation Marker Ki67: 10% REFERENCE RANGE ESTROGEN RECEPTOR NEGATIVE 0% POSITIVE =>1% REFERENCE RANGE PROGESTERONE RECEPTOR NEGATIVE 0% POSITIVE =>1% All controls stained appropriately Casimer Lanius MD Pathologist, Electronic Signature ( Signed 06/24/2022) 2. Breasr, left, needle core biopsy,upper outer quadrant,coil, shaped clip FLUORESCENCE IN-SITU HYBRIDIZATION Results: GROUP 5: HER2 **NEGATIVE** Equivocal form of amplification of the HER2 gene was detected in the IHC 2+ tissue sample received from this 3 of 5 FINAL for Plocher, Malee [id number]) ADDITIONAL INFORMATION:(continued) individual. HER2 FISH was performed by a technologist and cell imaging and analysis on the [employer]. RATIO OF HER2/CEN17 SIGNALS 1.36 AVERAGE HER2 COPY NUMBER PER CELL 2.65 The ratio of HER2/CEN 17 is within the range < 2.0 of HER2/CEN 17 and a copy number of HER2 signals per cell is <4.0. Arch Pathol Lab Med 1:1,2018 Casimer Lanius MD Pathologist, Electronic Signature ( Signed 06/25/2022) 2. Breast, left, needle core  biopsy, upper outer quadrant, coil shaped clip PROGNOSTIC INDICATORS Results: IMMUNOHISTOCHEMICAL AND MORPHOMETRIC ANALYSIS PERFORMED MANUALLY The tumor cells are equivocal for Her2 (2+). Her2 by FISH will be performed and the results reported separately. Estrogen Receptor: 95%, POSITIVE, STRONG STAINING INTENSITY Progesterone Receptor: 0%, NEGATIVE Proliferation Marker Ki67: 15% COMMENT: The negative hormone receptor study(ies) in this case has an internal positive control. REFERENCE RANGE ESTROGEN RECEPTOR NEGATIVE 0% POSITIVE =>1% REFERENCE RANGE PROGESTERONE RECEPTOR NEGATIVE 0% POSITIVE =>1% All controls stained appropriately Casimer Lanius MD Pathologist, Electronic Signature ( Signed 06/24/2022) CLINICAL DATA:  67 year old female presenting for annual bilateral mammogram and delayed follow-up of probably benign left breast calcifications.   EXAM: DIGITAL DIAGNOSTIC BILATERAL MAMMOGRAM WITH CAD; ULTRASOUND LEFT BREAST LIMITED   TECHNIQUE: Bilateral digital diagnostic mammography was performed.; Targeted ultrasound examination of the left breast was performed.   COMPARISON:  Previous exam(s).   ACR Breast Density Category c: The breasts are heterogeneously dense, which may obscure small masses.   FINDINGS: Loosely grouped punctate and amorphous calcifications in the upper outer left breast at mid to posterior depth appear to have slightly decreased when compared to prior comparison. There has been interval development of a spiculated hyperdense mass in the upper outer quadrant at posterior depth.   No new or suspicious findings within the right breast.   Targeted ultrasound is performed, showing an irregular, hypoechoic mass, taller than wide, at the 1 o'clock position 10 cm from the nipple. It measures 7 x 6 x 6 mm. There is no associated vascularity. This correlates with the mammographic finding. Evaluation of the left axilla demonstrates no  suspicious lymphadenopathy.   IMPRESSION: 1. Suspicious left breast mass at the 1 o'clock position. Recommend ultrasound-guided biopsy. 2. Indeterminate left breast calcifications. Recommend stereotactic biopsy. 3. No suspicious left axillary lymphadenopathy. 4. No mammographic evidence of malignancy on the right.   RECOMMENDATION: Stereotactic and ultrasound-guided biopsies of the left breast.   I have discussed the findings and recommendations with the patient. If applicable, a reminder letter will be sent to the patient regarding the next appointment.   BI-RADS CATEGORY  4: Suspicious.     Electronically Signed   By: Kristopher Oppenheim M.D.   On: 06/11/2022 16:50     Assessment and Plan:  Diagnoses and all orders for this visit:   Malignant neoplasm of upper-outer quadrant of left breast in female, estrogen receptor positive      I had a long discussion with her treatment team as well  as the patient regarding her surgical options.  She has 2 separate areas of invasive ductal carcinoma in the upper outer quadrant of her breast.  The mass is quite small, measuring only 7 mm.  The area of calcifications is slightly larger but still seems amenable to lumpectomy.  We discussed the options of mastectomy versus 2 separate lumpectomies along with sentinel lymph node biopsy.  The patient desires to keep her breast if at all possible.  We will attempt to separate left breast radioactive seed localized lumpectomies as well as a left axillary sentinel lymph node biopsy.  We discussed the options for mastectomy with or without reconstruction.  The patient is in agreement with the plan for breast conserving therapy.  She understands the possibility of positive margins and the need for further surgery.   Left breast radioactive seed localized lumpectomy x 2, left axillary sentinel lymph node biopsy.The surgical procedure has been discussed with the patient.  Potential risks, benefits, alternative  treatments, and expected outcomes have been explained.  All of the patient's questions at this time have been answered.  The likelihood of reaching the patient's treatment goal is good.  The patient understand the proposed surgical procedure and wishes to proceed. Surgery will be followed by Oncotype testing, radiation, and antiestrogens.   No follow-ups on file.   Linda Jews, MD  06/26/2022 11:48 AM     I had direct face-to-face contact with the patient for a total of 60  minutes and greater than 50% of that time was spent providing counseling and/or coordination of care for the patient regarding her left breast cancers.Marland Kitchen

## 2022-06-26 NOTE — Progress Notes (Signed)
[facility] NOTE  Patient Care Team: Shelda Pal, DO as PCP - General (Family Medicine) Rockwell Germany, RN as Oncology Nurse Navigator Tressie Ellis, Paulette Blanch, RN as Oncology Nurse Navigator Donnie Mesa, MD as Consulting Physician (General Surgery) Benay Pike, MD as Consulting Physician (Hematology and Oncology) Kyung Rudd, MD as Consulting Physician (Radiation Oncology)  CHIEF COMPLAINTS/PURPOSE OF CONSULTATION:  Newly diagnosed breast cancer  HISTORY OF PRESENTING ILLNESS:  Linda Wood 67 y.o. female is here because of recent diagnosis of left  Breast cancer  I reviewed her records extensively and collaborated the history with the patient.  SUMMARY OF ONCOLOGIC HISTORY: Oncology History  Malignant neoplasm of upper-outer quadrant of left breast in female, estrogen receptor positive ([place])  06/11/2022 Mammogram   Bilateral diagnostic mammogram showed suspicious left breast mass at 1 o'clock position.  Ultrasound-guided biopsy was recommended.  There were also indeterminate left breast calcs recommend stereotactic biopsy.  No suspicious left axillary adenopathy.  Right breast normal   06/20/2022 Pathology Results   Pathology results showed grade 2 invasive ductal carcinoma prognostics from the first specimen at 1:00 10 cm from the nipple showed ER 100% positive strong staining PR 20% positive moderate to strong staining and Ki-67 of 10%, tumor cells negative for HER2 1+.  Second biopsy which was from upper outer quadrant coil shaped clip also showed invasive ductal carcinoma grade 2 prognostics for this 1 showed ER 95% positive strong staining PR 0% negative Ki-67 of 15% and HER2 2+ by IHC and FISH negative   06/25/2022 Initial Diagnosis   Malignant neoplasm of upper-outer quadrant of left breast in female, estrogen receptor positive ([place])   06/26/2022 Cancer Staging   Staging form: Breast, AJCC 8th Edition - Clinical stage from 06/26/2022: Stage  IA (cT1b, cN0, cM0, G2, ER+, PR+, HER2-) - Signed by Hayden Pedro, PA-C on 06/26/2022 Stage prefix: Initial diagnosis Method of lymph node assessment: Clinical Histologic grading system: 3 grade system    This is a very pleasant 67 year old postmenopausal female patient with newly diagnosed left breast multifocal IDC referred to breast [place] for additional recommendations.  She arrived to the appointment today by herself.  She had baseline has rheumatoid arthritis and takes hydroxychloroquine.  She also had gastric bypass back in 2005 and lost about 100 pounds from this.  Her mother had breast cancer at the age of 59, currently living at 7.  Her father is also living at the age of 90.  No other family history of breast cancer or any other cancers.  She has 2 living children, 1 deceased.  Rest of the pertinent 10 point ROS reviewed and negative  MEDICAL HISTORY:  Past Medical History:  Diagnosis Date   Breast cancer ([place])    Depression    Hypertension    Rheumatoid arthritis ([place])     SURGICAL HISTORY: Past Surgical History:  Procedure Laterality Date   BREAST BIOPSY Left 06/20/2022   [place] LT BREAST BX W LOC DEV 1ST LESION IMG BX SPEC US GUIDE 06/20/2022 [facility]   BREAST BIOPSY Left 06/20/2022   MM LT BREAST BX W LOC DEV 1ST LESION IMAGE BX SPEC STEREO GUIDE 06/20/2022 [facility]   GASTRIC BYPASS     LASIK      SOCIAL HISTORY: Social History   Socioeconomic History   Marital status: Married    Spouse name: Not on file   Number of children: Not on file   Years of education: Not on file  Highest education level: Not on file  Occupational History   Not on file  Tobacco Use   Smoking status: Never   Smokeless tobacco: Never  Vaping Use   Vaping Use: Never used  Substance and Sexual Activity   Alcohol use: Yes    Comment: Rare   Drug use: Never   Sexual activity: Yes    Partners: Male  Other Topics Concern   Not on file  Social History Narrative    Not on file   Social Determinants of Health   Financial Resource Strain: Not on file  Food Insecurity: Not on file  Transportation Needs: Not on file  Physical Activity: Not on file  Stress: Not on file  Social Connections: Not on file  Intimate Partner Violence: Not on file    FAMILY HISTORY: Family History  Problem Relation Age of Onset   Heart failure Mother    Breast cancer Mother 62    ALLERGIES:  is allergic to dextromethorphan and promethazine.  MEDICATIONS:  Current Outpatient Medications  Medication Sig Dispense Refill   escitalopram (LEXAPRO) 20 MG tablet Take 1 tablet (20 mg total) by mouth daily. 90 tablet 2   hydroxychloroquine (PLAQUENIL) 200 MG tablet Take 1 tablet (200 mg total) by mouth in the morning. 90 tablet 0   losartan (COZAAR) 50 MG tablet TAKE 1 TABLET (50 MG TOTAL) BY MOUTH IN THE MORNING 30 tablet 1   traZODone (DESYREL) 50 MG tablet Take 0.5-1 tablets (25-50 mg total) by mouth at bedtime as needed for sleep. 90 tablet 2   ergocalciferol (VITAMIN D2) 1.25 MG (50000 UT) capsule Take 50,000 Units by mouth every Saturday.     No current facility-administered medications for this visit.    REVIEW OF SYSTEMS:   Constitutional: Denies fevers, chills or abnormal night sweats Eyes: Denies blurriness of vision, double vision or watery eyes Ears, nose, mouth, throat, and face: Denies mucositis or sore throat Respiratory: Denies cough, dyspnea or wheezes Cardiovascular: Denies palpitation, chest discomfort or lower extremity swelling Gastrointestinal:  Denies nausea, heartburn or change in bowel habits Skin: Denies abnormal skin rashes Lymphatics: Denies new lymphadenopathy or easy bruising Neurological:Denies numbness, tingling or new weaknesses Behavioral/Psych: Mood is stable, no new changes  Breast: Denies any palpable lumps or discharge All other systems were reviewed with the patient and are negative.  PHYSICAL EXAMINATION: ECOG PERFORMANCE  STATUS: 0 - Asymptomatic  Vitals:   06/26/22 0849  BP: (!) 149/78  Pulse: 72  Resp: 16  Temp: 97.7 F (36.5 C)  SpO2: 97%   Filed Weights   06/26/22 0849  Weight: 155 lb 8 oz (70.5 kg)    GENERAL:alert, no distress and comfortable Breast: Left breast with postbiopsy changes.  Breast mass size cannot be estimated.  No palpable regional adenopathy. No lower extremity edema Psych: Mood and behavior normal.  LABORATORY DATA:  I have reviewed the data as listed Lab Results  Component Value Date   WBC 6.6 06/26/2022   HGB 12.8 06/26/2022   HCT 39.6 06/26/2022   MCV 86.7 06/26/2022   PLT 320 06/26/2022   Lab Results  Component Value Date   NA 141 06/26/2022   K 4.1 06/26/2022   CL 107 06/26/2022   CO2 29 06/26/2022    RADIOGRAPHIC STUDIES: I have personally reviewed the radiological reports and agreed with the findings in the report.  ASSESSMENT AND PLAN:  Malignant neoplasm of upper-outer quadrant of left breast in female, estrogen receptor positive ([place]) This is a very  pleasant 67 year old postmenopausal female patient with newly diagnosed left breast multifocal IDC, grade 2, the first specimen was ER/PR positive HER2 negative, second specimen was ER positive PR negative and HER2 negative referred to breast [place] for recommendations.  She arrived to the appointment today by herself.  We have reviewed her images and pathology report in the breast [place].  Although multifocal, the area between the 2 biopsies appears close.  Dr. Georgette Dover is currently planning to lumpectomies.  She will proceed with lumpectomy followed by Oncotype testing from the specimen which is PR negative.  We have reviewed the following details about oncotype.  We have discussed about Oncotype Dx score which is a well validated prognostic scoring system which can predict outcome with endocrine therapy alone and whether chemotherapy reduces recurrence.  Typically in patients with ER positive cancers that are node  negative if the RS score is high typically greater than or equal to 26, chemotherapy is recommended.  In women with intermediate recurrence score younger than 43, there can still be some role for chemotherapy in addition to endocrine therapy especially if the recurrence score is between 21-25. If chemotherapy is needed, this will precede radiation and then after radiation she will continue on antiestrogen therapy.  Following Oncotype, we have discussed about antiestrogen therapy. We have discussed options for antiestrogen therapy today  With regards to Tamoxifen, we discussed that this is a SERM, selective estrogen receptor modulator. We discussed mechanism of action of Tamoxifen, adverse effects on Tamoxifen including but not limited to post menopausal symptoms, increased risk of DVT/PE, increased risk of endometrial cancer, questionable cataracts with long term use and increased risk of cardiovascular events in the study which was not statistically significant. A benefit from Tamoxifen would be improvement in bone density. With regards to aromatase inhibitors, we discussed mechanism of action, adverse effects including but not limited to post menopausal symptoms, arthralgias, myalgias, increased risk of cardiovascular events and bone loss.  Tamoxifen can be used in premenopausal and post menopausal women. Aromatase inhibitors can only be used in premenopausal women along with OFS.  We will reevaluate the final pathology and discuss any additional recommendations.  She will return to clinic after surgery.  Thank you for consulting [place] in the care of this patient.  Please do not hesitate to contact us with any additional questions or concerns.   All questions were answered. The patient knows to call the clinic with any problems, questions or concerns.    Benay Pike, MD 06/26/22

## 2022-06-26 NOTE — Assessment & Plan Note (Signed)
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  She arrived to the appointment today by herself.  We have reviewed her images and pathology report in the breast [facility].  Although multifocal, the area between the 2 biopsies appears close.  Dr. Georgette Dover is currently planning to lumpectomies.  She will proceed with lumpectomy followed by Oncotype testing from the specimen which is PR negative.  We have reviewed the following details about oncotype.  We have discussed about Oncotype Dx score which is a well validated prognostic scoring system which can predict outcome with endocrine therapy alone and whether chemotherapy reduces recurrence.  Typically in patients with ER positive cancers that are node negative if the RS score is high typically greater than or equal to 26, chemotherapy is recommended.  In women with intermediate recurrence score younger than 93, there can still be some role for chemotherapy in addition to endocrine therapy especially if the recurrence score is between 21-25. If chemotherapy is needed, this will precede radiation and then after radiation she will continue on antiestrogen therapy.  Following Oncotype, we have discussed about antiestrogen therapy. We have discussed options for antiestrogen therapy today  With regards to Tamoxifen, we discussed that this is a SERM, selective estrogen receptor modulator. We discussed mechanism of action of Tamoxifen, adverse effects on Tamoxifen including but not limited to post menopausal symptoms, increased risk of DVT/PE, increased risk of endometrial cancer, questionable cataracts with long term use and increased risk of cardiovascular events in the study which was not statistically significant. A benefit from Tamoxifen  would be improvement in bone density. With regards to aromatase inhibitors, we discussed mechanism of action, adverse effects including but not limited to post menopausal symptoms, arthralgias, myalgias, increased risk of cardiovascular events and bone loss.  Tamoxifen can be used in premenopausal and post menopausal women. Aromatase inhibitors can only be used in premenopausal women along with OFS.  We will reevaluate the final pathology and discuss any additional recommendations.  She will return to clinic after surgery.  Thank you for consulting [place] in the care of this patient.  Please do not hesitate to contact us with any additional questions or concerns.

## 2022-06-26 NOTE — H&P (View-Only) (Signed)
Subjective    Chief Complaint: Breast Cancer PCP - [facility]   Breast [facility]-06/26/2022 Iruku/ [place]   History of Present Illness: Linda Wood is a 67 y.o. female who is seen today as an office consultation at the request of Dr. Iruku for evaluation of Breast Cancer .     This is a 66-year-old female who recently underwent routine screening mammogram and follow-up of left breast calcifications.  She was found to have a mass located at 1:00 in the left breast, 10 cm from the nipple.  This measured 7 x 6 x 7 mm.  She also had some adjacent calcifications.  The mass was biopsied under ultrasound guidance.  The calcifications underwent stereotactic biopsy.   Pathology of the mass showed invasive ductal carcinoma grade 2, ER/PR positive, HER2 negative, Ki-67 10%.  The calcifications revealed invasive ductal carcinoma grade 2, ER positive, PR negative, HER2 negative, Ki-67 15%.  The patient presents now for discussion of her surgical options.  She had significant bruising after her biopsy.     Review of Systems: A complete review of systems was obtained from the patient.  I have reviewed this information and discussed as appropriate with the patient.  See HPI as well for other ROS.   Review of Systems  Constitutional: Negative.   HENT: Negative.    Eyes: Negative.   Respiratory: Negative.    Cardiovascular: Negative.   Gastrointestinal: Negative.   Genitourinary: Negative.   Musculoskeletal: Negative.   Skin: Negative.   Neurological: Negative.   Endo/Heme/Allergies: Negative.   Psychiatric/Behavioral: Negative.          Medical History: Past Medical History      Past Medical History:  Diagnosis Date   Anxiety     Arthritis     Hypertension             Patient Active Problem List  Diagnosis   Malignant neoplasm of upper-outer quadrant of left breast in female, estrogen receptor positive    Rheumatoid arthritis (CMS-HCC)   Essential hypertension      Past Surgical  History       Past Surgical History:  Procedure Laterality Date   LAPAROSCOPIC GASTRIC BYPASS   2007   Lasix Surgery        04-05?        Allergies      Allergies  Allergen Reactions   Dextromethorphan Anaphylaxis              Current Outpatient Medications on File Prior to Visit  Medication Sig Dispense Refill   ergocalciferol, vitamin D2, 1,250 mcg (50,000 unit) capsule Take by mouth       escitalopram oxalate (LEXAPRO) 20 MG tablet Take 20 mg by mouth once daily       hydroxychloroquine (PLAQUENIL) 200 mg tablet Take by mouth       losartan (COZAAR) 50 MG tablet Take by mouth       traZODone (DESYREL) 50 MG tablet Take by mouth        No current facility-administered medications on file prior to visit.      Family History       Family History  Problem Relation Age of Onset   Obesity Mother     High blood pressure (Hypertension) Mother     Coronary Artery Disease (Blocked arteries around heart) Mother     Breast cancer Mother     Colon cancer Father     Obesity Brother     High blood pressure (  Hypertension) Brother          Social History        Tobacco Use  Smoking Status Former   Types: Cigarettes  Smokeless Tobacco Never      Social History  Social History         Socioeconomic History   Marital status: Married  Tobacco Use   Smoking status: Former      Types: Cigarettes   Smokeless tobacco: Never  Vaping Use   Vaping Use: Never used  Substance and Sexual Activity   Alcohol use: Not Currently   Drug use: Never        Objective:      There were no vitals filed for this visit.  There is no height or weight on file to calculate BMI.   Physical Exam    Constitutional:  WDWN in NAD, conversant, no obvious deformities; lying in bed comfortably Eyes:  Pupils equal, round; sclera anicteric; moist conjunctiva; no lid lag HENT:  Oral mucosa moist; good dentition  Neck:  No masses palpated, trachea midline; no thyromegaly Lungs:  CTA  bilaterally; normal respiratory effort Breasts:  symmetric, no nipple changes; no palpable masses or lymphadenopathy on the right side.  The left side shows ecchymosis and hematoma in the lateral part of the breast.  No nipple discharge.  No axillary lymphadenopathy. CV:  Regular rate and rhythm; no murmurs; extremities well-perfused with no edema Abd:  +bowel sounds, soft, non-tender, no palpable organomegaly; no palpable hernias Musc: Normal gait; no apparent clubbing or cyanosis in extremities Lymphatic:  No palpable cervical or axillary lymphadenopathy Skin:  Warm, dry; no sign of jaundice Psychiatric - alert and oriented x 4; calm mood and affect     Labs, Imaging and Diagnostic Testing:   Diagnosis 1. Breast, left, needle core biopsy, 1 o'clock, 10 cmfn, ribbon shaped clip - INVASIVE DUCTAL CARCINOMA, SEE NOTE - TUBULE FORMATION: SCORE 3/3 - NUCLEAR PLEOMORPHISM: SCORE 2/3 - MITOTIC COUNT: SCORE 1/3 - TOTAL SCORE: 6/9 - OVERALL GRADE: II/III - LYMPHOVASCULAR INVASION: NOT IDENTIFIED - CANCER LENGTH: 6 MM IN GREATEST LINEAR DIMENSION ON FRAGMENTED CORES. - CALCIFICATIONS: NOT IDENTIFIED - OTHER FINDINGS: [] NOTE: DR. KASHIKAR REVIEWED THE CASE AND CONCURS WITH THE INTERPRETATION. A BREAST PROGNOSTIC PROFILE (ER, PR, KI-67 AND HER2) IS PENDING AND WILL BE REPORTED IN AN ADDENDUM. [facility] [place] WAS NOTIFIED ON 06/21/2022. 2. 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 KASHIKAR REVIEWED THE CASE AND CONCURS WITH THE INTERPRETATION. IT SHOULD BE NOTED THAT THE MORPHOLOGIES ARE SLIGHTLY DIFFERENT AND COULD REPRESENT SEPARATE TUMORS MORPHOLOGICALLY; THEREFORE, A BREAST PROGNOSTIC PROFILE (ER, PR, KI-67 AND HER2) IS PENDING AND WILL BE REPORTED IN AN ADDENDUM. [facility] [place] WAS NOTIFIED ON 06/21/2022. Mark LeGolvan DO Pathologist, Electronic Signature (Case signed 06/24/2022)   1. Breast, left, needle core biopsy, 1 o'clock, 10cmfn, ribbon shaped clip PROGNOSTIC INDICATORS Results: IMMUNOHISTOCHEMICAL AND MORPHOMETRIC ANALYSIS PERFORMED MANUALLY The   tumor cells are negative for Her2 (1+). Estrogen Receptor: 100%, POSITIVE, STRONG STAINING INTENSITY Progesterone Receptor: 20%, POSITIVE, MODERATE-STRONG STAINING INTENSITY Proliferation Marker Ki67: 10% REFERENCE RANGE ESTROGEN RECEPTOR NEGATIVE 0% POSITIVE =>1% REFERENCE RANGE PROGESTERONE RECEPTOR NEGATIVE 0% POSITIVE =>1% All controls stained appropriately Zhaoli Lane MD Pathologist, Electronic Signature ( Signed 06/24/2022) 2. Breasr, left, needle core biopsy,upper outer quadrant,coil, shaped clip FLUORESCENCE IN-SITU HYBRIDIZATION Results: GROUP 5: HER2 **NEGATIVE** Equivocal form of amplification of the HER2 gene was detected in the IHC 2+ tissue sample received from this 3 of 5 FINAL for [place], Chrystel ([id number]) ADDITIONAL INFORMATION:(continued) individual. HER2 FISH was performed by a technologist and cell imaging and analysis on the [employer]. RATIO OF HER2/CEN17 SIGNALS 1.36 AVERAGE HER2 COPY NUMBER PER CELL 2.65 The ratio of HER2/CEN 17 is within the range < 2.0 of HER2/CEN 17 and a copy number of HER2 signals per cell is <4.0. Arch Pathol Lab Med 1:1,2018 Zhaoli Lane MD Pathologist, Electronic Signature ( Signed 06/25/2022) 2. Breast, left, needle core  biopsy, upper outer quadrant, coil shaped clip PROGNOSTIC INDICATORS Results: IMMUNOHISTOCHEMICAL AND MORPHOMETRIC ANALYSIS PERFORMED MANUALLY The tumor cells are equivocal for Her2 (2+). Her2 by FISH will be performed and the results reported separately. Estrogen Receptor: 95%, POSITIVE, STRONG STAINING INTENSITY Progesterone Receptor: 0%, NEGATIVE Proliferation Marker Ki67: 15% COMMENT: The negative hormone receptor study(ies) in this case has an internal positive control. REFERENCE RANGE ESTROGEN RECEPTOR NEGATIVE 0% POSITIVE =>1% REFERENCE RANGE PROGESTERONE RECEPTOR NEGATIVE 0% POSITIVE =>1% All controls stained appropriately Zhaoli Lane MD Pathologist, Electronic Signature ( Signed 06/24/2022) CLINICAL DATA:  66-year-old female presenting for annual bilateral mammogram and delayed follow-up of probably benign left breast calcifications.   EXAM: DIGITAL DIAGNOSTIC BILATERAL MAMMOGRAM WITH CAD; ULTRASOUND LEFT BREAST LIMITED   TECHNIQUE: Bilateral digital diagnostic mammography was performed.; Targeted ultrasound examination of the left breast was performed.   COMPARISON:  Previous exam(s).   ACR Breast Density Category c: The breasts are heterogeneously dense, which may obscure small masses.   FINDINGS: Loosely grouped punctate and amorphous calcifications in the upper outer left breast at mid to posterior depth appear to have slightly decreased when compared to prior comparison. There has been interval development of a spiculated hyperdense mass in the upper outer quadrant at posterior depth.   No new or suspicious findings within the right breast.   Targeted ultrasound is performed, showing an irregular, hypoechoic mass, taller than wide, at the 1 o'clock position 10 cm from the nipple. It measures 7 x 6 x 6 mm. There is no associated vascularity. This correlates with the mammographic finding. Evaluation of the left axilla demonstrates no  suspicious lymphadenopathy.   IMPRESSION: 1. Suspicious left breast mass at the 1 o'clock position. Recommend ultrasound-guided biopsy. 2. Indeterminate left breast calcifications. Recommend stereotactic biopsy. 3. No suspicious left axillary lymphadenopathy. 4. No mammographic evidence of malignancy on the right.   RECOMMENDATION: Stereotactic and ultrasound-guided biopsies of the left breast.   I have discussed the findings and recommendations with the patient. If applicable, a reminder letter will be sent to the patient regarding the next appointment.   BI-RADS CATEGORY  4: Suspicious.     Electronically Signed   By: Serena  Chacko M.D.   On: 06/11/2022 16:50     Assessment and Plan:  Diagnoses and all orders for this visit:   Malignant neoplasm of upper-outer quadrant of left breast in female, estrogen receptor positive      I had a long discussion with her treatment team as well   as the patient regarding her surgical options.  She has 2 separate areas of invasive ductal carcinoma in the upper outer quadrant of her breast.  The mass is quite small, measuring only 7 mm.  The area of calcifications is slightly larger but still seems amenable to lumpectomy.  We discussed the options of mastectomy versus 2 separate lumpectomies along with sentinel lymph node biopsy.  The patient desires to keep her breast if at all possible.  We will attempt to separate left breast radioactive seed localized lumpectomies as well as a left axillary sentinel lymph node biopsy.  We discussed the options for mastectomy with or without reconstruction.  The patient is in agreement with the plan for breast conserving therapy.  She understands the possibility of positive margins and the need for further surgery.   Left breast radioactive seed localized lumpectomy x 2, left axillary sentinel lymph node biopsy.The surgical procedure has been discussed with the patient.  Potential risks, benefits, alternative  treatments, and expected outcomes have been explained.  All of the patient's questions at this time have been answered.  The likelihood of reaching the patient's treatment goal is good.  The patient understand the proposed surgical procedure and wishes to proceed. Surgery will be followed by Oncotype testing, radiation, and antiestrogens.   No follow-ups on file.   Agostino Gorin KAI Ellenore Roscoe, MD  06/26/2022 11:48 AM     I had direct face-to-face contact with the patient for a total of 60  minutes and greater than 50% of that time was spent providing counseling and/or coordination of care for the patient regarding her left breast cancers..   

## 2022-06-26 NOTE — Therapy (Signed)
OUTPATIENT PHYSICAL THERAPY BREAST CANCER BASELINE EVALUATION   Patient Name: Linda Wood MRN: [id number] DOB:01-Nov-1955, 67 y.o., female Today's Date: 06/26/2022  END OF SESSION:  PT End of Session - 06/26/22 1230     Visit Number 1    Number of Visits 2    Date for PT Re-Evaluation 08/21/22    PT Start Time 0953    PT Stop Time 1004   Also saw pt from 1031-1050 for a total of 30 min   PT Time Calculation (min) 11 min    Activity Tolerance Patient tolerated treatment well    Behavior During Therapy [facility] for tasks assessed/performed             Past Medical History:  Diagnosis Date   Breast cancer ([place])    Depression    Hypertension    Rheumatoid arthritis ([place])    Past Surgical History:  Procedure Laterality Date   BREAST BIOPSY Left 06/20/2022   [place] LT BREAST BX W LOC DEV 1ST LESION IMG BX SPEC US GUIDE 06/20/2022 [facility]   BREAST BIOPSY Left 06/20/2022   MM LT BREAST BX W LOC DEV 1ST LESION IMAGE BX SPEC STEREO GUIDE 06/20/2022 [facility]   GASTRIC BYPASS     LASIK     Patient Active Problem List   Diagnosis Date Noted   Malignant neoplasm of upper-outer quadrant of left breast in female, estrogen receptor positive ([place]) 06/25/2022   Psychophysiological insomnia 11/28/2021   Rheumatoid arthritis ([place]) 10/29/2021   Essential hypertension 10/29/2021   Depression, major, single episode, complete remission ([place]) 10/29/2021   Rib fractures 10/15/2021    REFERRING PROVIDER: Dr. Donnie Mesa  REFERRING DIAG: Left breast cancer  THERAPY DIAG:  Malignant neoplasm of upper-outer quadrant of left breast in female, estrogen receptor positive ([place])  Abnormal posture  Rationale for Evaluation and Treatment: Rehabilitation  ONSET DATE: 06/20/2022  SUBJECTIVE:                                                                                                                                                                                            SUBJECTIVE STATEMENT: Patient reports she is here today to be seen by her medical team for her newly diagnosed left breast cancer.   PERTINENT HISTORY:  Patient was diagnosed on 06/20/2022 with left grade 2 invasive ductal carcinoma breast cancer. It measures 7 mm and is located in the upper outer quadrant. It is ER/PR positive and HER2 negative with a Ki67 of 10%.   PATIENT GOALS:   reduce lymphedema risk and learn post op HEP.   PAIN:  Are you having pain? No  PRECAUTIONS: Active CA   HAND DOMINANCE: right  WEIGHT BEARING RESTRICTIONS: No  FALLS:  Has patient fallen in last 6 months? No  LIVING ENVIRONMENT: Patient lives with: her husband Lives in: House/apartment Has following equipment at home: None  OCCUPATION: Retired  LEISURE: Walks her dog but does not exercise  PRIOR LEVEL OF FUNCTION: Independent   OBJECTIVE:  COGNITION: Overall cognitive status: Within functional limits for tasks assessed    POSTURE:  Forward head and rounded shoulders posture  UPPER EXTREMITY AROM/PROM:  A/PROM RIGHT   eval   Shoulder extension 53  Shoulder flexion 133  Shoulder abduction 157  Shoulder internal rotation 76  Shoulder external rotation 86    (Blank rows = not tested)  A/PROM LEFT   eval  Shoulder extension 51  Shoulder flexion 136  Shoulder abduction 151  Shoulder internal rotation 75  Shoulder external rotation 72    (Blank rows = not tested)  CERVICAL AROM: All within normal limits  UPPER EXTREMITY STRENGTH: WFL  LYMPHEDEMA ASSESSMENTS:   LANDMARK RIGHT   eval  10 cm proximal to olecranon process 29.8  Olecranon process 22.4  10 cm proximal to ulnar styloid process 20  Just proximal to ulnar styloid process 15  Across hand at thumb web space 17.5  At base of 2nd digit 6.3  (Blank rows = not tested)  LANDMARK LEFT   eval  10 cm proximal to olecranon process 28.8  Olecranon process 23  10 cm proximal to ulnar styloid process 19.5  Just  proximal to ulnar styloid process 15.2  Across hand at thumb web space 17.3  At base of 2nd digit 6.1  (Blank rows = not tested)  L-DEX LYMPHEDEMA SCREENING:  The patient was assessed using the L-Dex machine today to produce a lymphedema index baseline score. The patient will be reassessed on a regular basis (typically every 3 months) to obtain new L-Dex scores. If the score is > 6.5 points away from his/her baseline score indicating onset of subclinical lymphedema, it will be recommended to wear a compression garment for 4 weeks, 12 hours per day and then be reassessed. If the score continues to be > 6.5 points from baseline at reassessment, we will initiate lymphedema treatment. Assessing in this manner has a 95% rate of preventing clinically significant lymphedema.   L-DEX FLOWSHEETS - 06/26/22 1200       L-DEX LYMPHEDEMA SCREENING   Measurement Type Unilateral    L-DEX MEASUREMENT EXTREMITY Upper Extremity    POSITION  Standing    DOMINANT SIDE Right    At Risk Side Left    BASELINE SCORE (UNILATERAL) 8.2             QUICK DASH SURVEY:  Katina Dung - 06/26/22 0001     Open a tight or new jar Severe difficulty    Do heavy household chores (wash walls, wash floors) Severe difficulty    Carry a shopping bag or briefcase Mild difficulty    Wash your back No difficulty    Use a knife to cut food No difficulty    Recreational activities in which you take some force or impact through your arm, shoulder, or hand (golf, hammering, tennis) Mild difficulty    During the past week, to what extent has your arm, shoulder or hand problem interfered with your normal social activities with family, friends, neighbors, or groups? Not at all    During the past week, to what extent has your arm, shoulder or hand  problem limited your work or other regular daily activities Not at all    Arm, shoulder, or hand pain. None    Tingling (pins and needles) in your arm, shoulder, or hand None     Difficulty Sleeping No difficulty    DASH Score 18.18 %              PATIENT EDUCATION:  Education details: Lymphedema risk reduction and post op shoulder/posture HEP Person educated: Patient Education method: Explanation, Demonstration, Handout Education comprehension: Patient verbalized understanding and returned demonstration  HOME EXERCISE PROGRAM: Patient was instructed today in a home exercise program today for post op shoulder range of motion. These included active assist shoulder flexion in sitting, scapular retraction, wall walking with shoulder abduction, and hands behind head external rotation.  She was encouraged to do these twice a day, holding 3 seconds and repeating 5 times when permitted by her physician.   ASSESSMENT:  CLINICAL IMPRESSION: Patient was diagnosed on 06/20/2022 with left grade 2 invasive ductal carcinoma breast cancer. It measures 7 mm and is located in the upper outer quadrant. It is ER/PR positive and HER2 negative with a Ki67 of 10%. Her multidisciplinary medical team met prior to her assessments to determine a recommended treatment plan. She is planning to have a left lumpectomy and sentinel node biopsy followed by Oncotype testing, radiation, an anti-estrogen therapy. She will benefit from a post op PT reassessment to determine needs and from L-Dex screens every 3 months for 2 years to detect subclinical lymphedema.  Pt will benefit from skilled therapeutic intervention to improve on the following deficits: Decreased knowledge of precautions, impaired UE functional use, pain, decreased ROM, postural dysfunction.   PT treatment/interventions: ADL/self-care home management, pt/family education, therapeutic exercise  REHAB POTENTIAL: Excellent  CLINICAL DECISION MAKING: Stable/uncomplicated  EVALUATION COMPLEXITY: Low   GOALS: Goals reviewed with patient? YES  LONG TERM GOALS: (STG=LTG)    Name Target Date Goal status  1 Pt will be able to  verbalize understanding of pertinent lymphedema risk reduction practices relevant to her dx specifically related to skin care.  Baseline:  No knowledge 06/26/2022 Achieved at eval  2 Pt will be able to return demo and/or verbalize understanding of the post op HEP related to regaining shoulder ROM. Baseline:  No knowledge 06/26/2022 Achieved at eval  3 Pt will be able to verbalize understanding of the importance of attending the post op After Breast CA Class for further lymphedema risk reduction education and therapeutic exercise.  Baseline:  No knowledge 06/26/2022 Achieved at eval  4 Pt will demo she has regained full shoulder ROM and function post operatively compared to baselines.  Baseline: See objective measurements taken today. 08/21/2022     PLAN:  PT FREQUENCY/DURATION: EVAL and 1 follow up appointment.   PLAN FOR NEXT SESSION: will reassess 3-4 weeks post op to determine needs.   Patient will follow up at outpatient cancer rehab 3-4 weeks following surgery.  If the patient requires physical therapy at that time, a specific plan will be dictated and sent to the referring physician for approval. The patient was educated today on appropriate basic range of motion exercises to begin post operatively and the importance of attending the After Breast Cancer class following surgery.  Patient was educated today on lymphedema risk reduction practices as it pertains to recommendations that will benefit the patient immediately following surgery.  She verbalized good understanding.    Physical Therapy Information for After Breast Cancer Surgery/Treatment:  Lymphedema is a  swelling condition that you may be at risk for in your arm if you have lymph nodes removed from the armpit area.  After a sentinel node biopsy, the risk is approximately 5-9% and is higher after an axillary node dissection.  There is treatment available for this condition and it is not life-threatening.  Contact your physician or  physical therapist with concerns. You may begin the 4 shoulder/posture exercises (see additional sheet) when permitted by your physician (typically a week after surgery).  If you have drains, you may need to wait until those are removed before beginning range of motion exercises.  A general recommendation is to not lift your arms above shoulder height until drains are removed.  These exercises should be done to your tolerance and gently.  This is not a "no pain/no gain" type of recovery so listen to your body and stretch into the range of motion that you can tolerate, stopping if you have pain.  If you are having immediate reconstruction, ask your plastic surgeon about doing exercises as he or she may want you to wait. We encourage you to attend the free one time ABC (After Breast Cancer) class offered by [facility].  You will learn information related to lymphedema risk, prevention and treatment and additional exercises to regain mobility following surgery.  You can call [phone] for more information.  This is offered the 1st and 3rd Monday of each month.  You only attend the class one time. While undergoing any medical procedure or treatment, try to avoid blood pressure being taken or needle sticks from occurring on the arm on the side of cancer.   This recommendation begins after surgery and continues for the rest of your life.  This may help reduce your risk of getting lymphedema (swelling in your arm). An excellent resource for those seeking information on lymphedema is the National Lymphedema Network's web site. It can be accessed at [place].org If you notice swelling in your hand, arm or breast at any time following surgery (even if it is many years from now), please contact your doctor or physical therapist to discuss this.  Lymphedema can be treated at any time but it is easier for you if it is treated early on.  If you feel like your shoulder motion is not returning  to normal in a reasonable amount of time, please contact your surgeon or physical therapist.  [facility] [phone]. [address], [address], [place] [address]  ABC CLASS After Breast Cancer Class  After Breast Cancer Class is a specially designed exercise class to assist you in a safe recover after having breast cancer surgery.  In this class you will learn how to get back to full function whether your drains were just removed or if you had surgery a month ago.  This one-time class is held the 1st and 3rd Monday of every month from 11:00 a.m. until 12:00 noon virtually.  This class is FREE and space is limited. For more information or to register for the next available class, call [phone].  Class Goals  Understand specific stretches to improve the flexibility of you chest and shoulder. Learn ways to safely strengthen your upper body and improve your posture. Understand the warning signs of infection and why you may be at risk for an arm infection. Learn about Lymphedema and prevention.  ** You do not attend this class until after surgery.  Drains must be removed to participate  Patient was instructed today in a home exercise program today for post op shoulder range of motion. These included active assist shoulder flexion in sitting, scapular retraction, wall walking with shoulder abduction, and hands behind head external rotation.  She was encouraged to do these twice a day, holding 3 seconds and repeating 5 times when permitted by her physician.  Annia Friendly, [place] 06/26/22 12:36 PM

## 2022-06-26 NOTE — H&P (View-Only) (Signed)
Subjective    Chief Complaint: Breast Cancer PCP - Linda Wood   Breast MDC-06/26/2022 Linda Wood/ Linda Wood   History of Present Illness: Linda Wood is a 67 y.o. female who is seen today as an office consultation at the request of Linda Wood for evaluation of Breast Cancer .     This is a 66-year-old female who recently underwent routine screening mammogram and follow-up of left breast calcifications.  She was found to have a mass located at 1:00 in the left breast, 10 cm from the nipple.  This measured 7 x 6 x 7 mm.  She also had some adjacent calcifications.  The mass was biopsied under ultrasound guidance.  The calcifications underwent stereotactic biopsy.   Pathology of the mass showed invasive ductal carcinoma grade 2, ER/PR positive, HER2 negative, Ki-67 10%.  The calcifications revealed invasive ductal carcinoma grade 2, ER positive, PR negative, HER2 negative, Ki-67 15%.  The patient presents now for discussion of her surgical options.  She had significant bruising after her biopsy.     Review of Systems: A complete review of systems was obtained from the patient.  I have reviewed this information and discussed as appropriate with the patient.  See HPI as well for other ROS.   Review of Systems  Constitutional: Negative.   HENT: Negative.    Eyes: Negative.   Respiratory: Negative.    Cardiovascular: Negative.   Gastrointestinal: Negative.   Genitourinary: Negative.   Musculoskeletal: Negative.   Skin: Negative.   Neurological: Negative.   Endo/Heme/Allergies: Negative.   Psychiatric/Behavioral: Negative.          Medical History: Past Medical History      Past Medical History:  Diagnosis Date   Anxiety     Arthritis     Hypertension             Patient Active Problem List  Diagnosis   Malignant neoplasm of upper-outer quadrant of left breast in female, estrogen receptor positive    Rheumatoid arthritis (CMS-HCC)   Essential hypertension      Past Surgical  History       Past Surgical History:  Procedure Laterality Date   LAPAROSCOPIC GASTRIC BYPASS   2007   Lasix Surgery        04-05?        Allergies      Allergies  Allergen Reactions   Dextromethorphan Anaphylaxis              Current Outpatient Medications on File Prior to Visit  Medication Sig Dispense Refill   ergocalciferol, vitamin D2, 1,250 mcg (50,000 unit) capsule Take by mouth       escitalopram oxalate (LEXAPRO) 20 MG tablet Take 20 mg by mouth once daily       hydroxychloroquine (PLAQUENIL) 200 mg tablet Take by mouth       losartan (COZAAR) 50 MG tablet Take by mouth       traZODone (DESYREL) 50 MG tablet Take by mouth        No current facility-administered medications on file prior to visit.      Family History       Family History  Problem Relation Age of Onset   Obesity Mother     High blood pressure (Hypertension) Mother     Coronary Artery Disease (Blocked arteries around heart) Mother     Breast cancer Mother     Colon cancer Father     Obesity Brother     High blood pressure (  Hypertension) Brother          Social History        Tobacco Use  Smoking Status Former   Types: Cigarettes  Smokeless Tobacco Never      Social History  Social History         Socioeconomic History   Marital status: Married  Tobacco Use   Smoking status: Former      Types: Cigarettes   Smokeless tobacco: Never  Vaping Use   Vaping Use: Never used  Substance and Sexual Activity   Alcohol use: Not Currently   Drug use: Never        Objective:      There were no vitals filed for this visit.  There is no height or weight on file to calculate BMI.   Physical Exam    Constitutional:  WDWN in NAD, conversant, no obvious deformities; lying in bed comfortably Eyes:  Pupils equal, round; sclera anicteric; moist conjunctiva; no lid lag HENT:  Oral mucosa moist; good dentition  Neck:  No masses palpated, trachea midline; no thyromegaly Lungs:  CTA  bilaterally; normal respiratory effort Breasts:  symmetric, no nipple changes; no palpable masses or lymphadenopathy on the right side.  The left side shows ecchymosis and hematoma in the lateral part of the breast.  No nipple discharge.  No axillary lymphadenopathy. CV:  Regular rate and rhythm; no murmurs; extremities well-perfused with no edema Abd:  +bowel sounds, soft, non-tender, no palpable organomegaly; no palpable hernias Musc: Normal gait; no apparent clubbing or cyanosis in extremities Lymphatic:  No palpable cervical or axillary lymphadenopathy Skin:  Warm, dry; no sign of jaundice Psychiatric - alert and oriented x 4; calm mood and affect     Labs, Imaging and Diagnostic Testing:   Diagnosis 1. Breast, left, needle core biopsy, 1 o'clock, 10 cmfn, ribbon shaped clip - INVASIVE DUCTAL CARCINOMA, SEE NOTE - TUBULE FORMATION: SCORE 3/3 - NUCLEAR PLEOMORPHISM: SCORE 2/3 - MITOTIC COUNT: SCORE 1/3 - TOTAL SCORE: 6/9 - OVERALL GRADE: II/III - LYMPHOVASCULAR INVASION: NOT IDENTIFIED - CANCER LENGTH: 6 MM IN GREATEST LINEAR DIMENSION ON FRAGMENTED CORES. - CALCIFICATIONS: NOT IDENTIFIED - OTHER FINDINGS: [] NOTE: Linda Wood REVIEWED THE CASE AND CONCURS WITH THE INTERPRETATION. A BREAST PROGNOSTIC PROFILE (ER, PR, KI-67 AND HER2) IS PENDING AND WILL BE REPORTED IN AN ADDENDUM. THE [facility] [place] WAS NOTIFIED ON 06/21/2022. 2. Breast, left, needle core biopsy, upper outer quadrant, coil shaped clip - INVASIVE DUCTAL CARCINOMA, SEE NOTE - DUCTAL CARCINOMA IN SITU, SOLID AND TYPE WITH SINGLE CELL NECROSIS, NUCLEAR GRADE 2 OF 3 1 of 5 FINAL for Linda Wood ([id number]) Diagnosis(continued) - TUBULE FORMATION: SCORE 3/3 - NUCLEAR PLEOMORPHISM: SCORE 2/3 - MITOTIC COUNT: SCORE 1/3 - TOTAL SCORE: 6/9 - OVERALL GRADE: II/III - LYMPHOVASCULAR INVASION: NOT IDENTIFIED - CANCER LENGTH: 3 MM IN GREATEST LINEAR DIMENSION - CALCIFICATIONS: PRESENT IN BOTH INVASIVE  AND DUCTAL CARCINOMA IN SITU - OTHER FINDINGS: FIBROADENOMATOUS CHANGE AND FIBROCYSTIC CHANGE NOTE: Linda Wood REVIEWED THE CASE AND CONCURS WITH THE INTERPRETATION. IT SHOULD BE NOTED THAT THE MORPHOLOGIES ARE SLIGHTLY DIFFERENT AND COULD REPRESENT SEPARATE TUMORS MORPHOLOGICALLY; THEREFORE, A BREAST PROGNOSTIC PROFILE (ER, PR, KI-67 AND HER2) IS PENDING AND WILL BE REPORTED IN AN ADDENDUM. THE [facility] [place] WAS NOTIFIED ON 06/21/2022. Mark LeGolvan DO Pathologist, Electronic Signature (Case signed 06/24/2022)   1. Breast, left, needle core biopsy, 1 o'clock, 10cmfn, ribbon shaped clip PROGNOSTIC INDICATORS Results: IMMUNOHISTOCHEMICAL AND MORPHOMETRIC ANALYSIS PERFORMED MANUALLY The   tumor cells are negative for Her2 (1+). Estrogen Receptor: 100%, POSITIVE, STRONG STAINING INTENSITY Progesterone Receptor: 20%, POSITIVE, MODERATE-STRONG STAINING INTENSITY Proliferation Marker Ki67: 10% REFERENCE RANGE ESTROGEN RECEPTOR NEGATIVE 0% POSITIVE =>1% REFERENCE RANGE PROGESTERONE RECEPTOR NEGATIVE 0% POSITIVE =>1% All controls stained appropriately Zhaoli Lane MD Pathologist, Electronic Signature ( Signed 06/24/2022) 2. Breasr, left, needle core biopsy,upper outer quadrant,coil, shaped clip FLUORESCENCE IN-SITU HYBRIDIZATION Results: GROUP 5: HER2 **NEGATIVE** Equivocal form of amplification of the HER2 gene was detected in the IHC 2+ tissue sample received from this 3 of 5 FINAL for Holderness, Tawni ([id number]) ADDITIONAL INFORMATION:(continued) individual. HER2 FISH was performed by a technologist and cell imaging and analysis on the BioView. RATIO OF HER2/CEN17 SIGNALS 1.36 AVERAGE HER2 COPY NUMBER PER CELL 2.65 The ratio of HER2/CEN 17 is within the range < 2.0 of HER2/CEN 17 and a copy number of HER2 signals per cell is <4.0. Arch Pathol Lab Med 1:1,2018 Zhaoli Lane MD Pathologist, Electronic Signature ( Signed 06/25/2022) 2. Breast, left, needle core  biopsy, upper outer quadrant, coil shaped clip PROGNOSTIC INDICATORS Results: IMMUNOHISTOCHEMICAL AND MORPHOMETRIC ANALYSIS PERFORMED MANUALLY The tumor cells are equivocal for Her2 (2+). Her2 by FISH will be performed and the results reported separately. Estrogen Receptor: 95%, POSITIVE, STRONG STAINING INTENSITY Progesterone Receptor: 0%, NEGATIVE Proliferation Marker Ki67: 15% COMMENT: The negative hormone receptor study(ies) in this case has an internal positive control. REFERENCE RANGE ESTROGEN RECEPTOR NEGATIVE 0% POSITIVE =>1% REFERENCE RANGE PROGESTERONE RECEPTOR NEGATIVE 0% POSITIVE =>1% All controls stained appropriately Zhaoli Lane MD Pathologist, Electronic Signature ( Signed 06/24/2022) CLINICAL DATA:  66-year-old female presenting for annual bilateral mammogram and delayed follow-up of probably benign left breast calcifications.   EXAM: DIGITAL DIAGNOSTIC BILATERAL MAMMOGRAM WITH CAD; ULTRASOUND LEFT BREAST LIMITED   TECHNIQUE: Bilateral digital diagnostic mammography was performed.; Targeted ultrasound examination of the left breast was performed.   COMPARISON:  Previous exam(s).   ACR Breast Density Category c: The breasts are heterogeneously dense, which may obscure small masses.   FINDINGS: Loosely grouped punctate and amorphous calcifications in the upper outer left breast at mid to posterior depth appear to have slightly decreased when compared to prior comparison. There has been interval development of a spiculated hyperdense mass in the upper outer quadrant at posterior depth.   No new or suspicious findings within the right breast.   Targeted ultrasound is performed, showing an irregular, hypoechoic mass, taller than wide, at the 1 o'clock position 10 cm from the nipple. It measures 7 x 6 x 6 mm. There is no associated vascularity. This correlates with the mammographic finding. Evaluation of the left axilla demonstrates no  suspicious lymphadenopathy.   IMPRESSION: 1. Suspicious left breast mass at the 1 o'clock position. Recommend ultrasound-guided biopsy. 2. Indeterminate left breast calcifications. Recommend stereotactic biopsy. 3. No suspicious left axillary lymphadenopathy. 4. No mammographic evidence of malignancy on the right.   RECOMMENDATION: Stereotactic and ultrasound-guided biopsies of the left breast.   I have discussed the findings and recommendations with the patient. If applicable, a reminder letter will be sent to the patient regarding the next appointment.   BI-RADS CATEGORY  4: Suspicious.     Electronically Signed   By: Serena  Chacko M.D.   On: 06/11/2022 16:50     Assessment and Plan:  Diagnoses and all orders for this visit:   Malignant neoplasm of upper-outer quadrant of left breast in female, estrogen receptor positive      I had a long discussion with her treatment team as well   as the patient regarding her surgical options.  She has 2 separate areas of invasive ductal carcinoma in the upper outer quadrant of her breast.  The mass is quite small, measuring only 7 mm.  The area of calcifications is slightly larger but still seems amenable to lumpectomy.  We discussed the options of mastectomy versus 2 separate lumpectomies along with sentinel lymph node biopsy.  The patient desires to keep her breast if at all possible.  We will attempt to separate left breast radioactive seed localized lumpectomies as well as a left axillary sentinel lymph node biopsy.  We discussed the options for mastectomy with or without reconstruction.  The patient is in agreement with the plan for breast conserving therapy.  She understands the possibility of positive margins and the need for further surgery.   Left breast radioactive seed localized lumpectomy x 2, left axillary sentinel lymph node biopsy.The surgical procedure has been discussed with the patient.  Potential risks, benefits, alternative  treatments, and expected outcomes have been explained.  All of the patient's questions at this time have been answered.  The likelihood of reaching the patient's treatment goal is good.  The patient understand the proposed surgical procedure and wishes to proceed. Surgery will be followed by Oncotype testing, radiation, and antiestrogens.   No follow-ups on file.   Addysin Porco KAI Spenser Harren, MD  06/26/2022 11:48 AM     I had direct face-to-face contact with the patient for a total of 60  minutes and greater than 50% of that time was spent providing counseling and/or coordination of care for the patient regarding her left breast cancers..   

## 2022-06-26 NOTE — Progress Notes (Signed)
[facility] Clinical Social Work  Initial Assessment   Yalitza Wallace is a 67 y.o. year old female presenting alone. Clinical Social Work was referred by  [facility]  for assessment of psychosocial needs.   SDOH (Social Determinants of Health) assessments performed: Yes SDOH Interventions    Flowsheet Row Clinical Support from 06/26/2022 in [place] at [facility]  SDOH Interventions   Food Insecurity Interventions Intervention Not Indicated  Housing Interventions Intervention Not Indicated  Transportation Interventions Intervention Not Indicated  Utilities Interventions Intervention Not Indicated  Financial Strain Interventions Intervention Not Indicated       SDOH Screenings   Food Insecurity: No Food Insecurity (06/26/2022)  Housing: Low Risk  (06/26/2022)  Transportation Needs: No Transportation Needs (06/26/2022)  Utilities: Not At Risk (06/26/2022)  Depression (PHQ2-9): Low Risk  (05/03/2022)  Financial Resource Strain: Low Risk  (06/26/2022)  Tobacco Use: Low Risk  (06/26/2022)     Distress Screen completed: Yes    06/26/2022    2:39 PM  ONCBCN DISTRESS SCREENING  Screening Type Initial Screening  Distress experienced in past week (1-10) 5  Emotional problem type Adjusting to illness  Spiritual/Religous concerns type Facing my mortality      Family/Social Information:  Housing Arrangement: patient lives with spouse . Son Aaron Edelman 64 lives in [place] with 41mochild, daughter [place]Joelene [place]356[place] members/support persons in your life? Family and Friends Transportation concerns: no  Employment: Retired .  Income source: retirement income and savings Financial concerns: No Type of concern: None Food access concerns: no Religious or spiritual practice: Not known [facility]  Coping/ Adjustment to diagnosis: Patient understands treatment plan and what happens next? yes Concerns about diagnosis  and/or treatment:  general adjustment Patient reported stressors: Adjusting to my illness and stressor with elderly mother Patient enjoys sewing/ knitting, reading, and time with family/ friends Current coping skills/ strengths: Ability for insight , Capable of independent living , Communication skills , Special hobby/interest , and Supportive family/friends     SUMMARY: Current SDOH Barriers:  No major barriers noted today  Clinical Social Work Clinical Goal(s):  No clinical social work goals at this time  Interventions: Discussed common feeling and emotions when being diagnosed with cancer, and the importance of support during treatment Informed patient of the support team roles and support services at [facility] CM[facility]contact information and encouraged patient to call with any questions or concerns   Follow Up Plan: Patient will contact CSW with any support or resource needs Patient verbalizes understanding of plan: Yes    Maziyah Vessel E Averie Hornbaker, LCSW

## 2022-06-27 ENCOUNTER — Other Ambulatory Visit: Payer: Self-pay | Admitting: Hematology and Oncology

## 2022-06-27 ENCOUNTER — Encounter: Payer: Self-pay | Admitting: Genetic Counselor

## 2022-06-27 DIAGNOSIS — Z170 Estrogen receptor positive status [ER+]: Secondary | ICD-10-CM

## 2022-06-27 DIAGNOSIS — Z803 Family history of malignant neoplasm of breast: Secondary | ICD-10-CM | POA: Insufficient documentation

## 2022-06-27 NOTE — Progress Notes (Signed)
REFERRING PROVIDER: Benay Pike, MD  PRIMARY PROVIDER:  Shelda Pal, DO  PRIMARY REASON FOR VISIT:  1. Malignant neoplasm of upper-outer quadrant of left breast in female, estrogen receptor positive ([place])   2. Family history of breast cancer     HISTORY OF PRESENT ILLNESS:   Ms. Legrow, a 67 y.o. female, was seen for a [place] cancer genetics consultation at the request of Dr. Chryl Heck due to a personal and family history of cancer.  Ms. Birkes presents to clinic today to discuss the possibility of a hereditary predisposition to cancer, to discuss genetic testing, and to further clarify her future cancer risks, as well as potential cancer risks for family members.   In February 2024, at the age of 27, Ms. Lunny was diagnosed with invasive ductal carcinoma of the left breast. The 1:00 biopsy showed ER positive, PR positive, HER2 negative IDC and the calcifications showed ER positive, PR negative, and HER2 negative IDC.   CANCER HISTORY:  Oncology History  Malignant neoplasm of upper-outer quadrant of left breast in female, estrogen receptor positive ([place])  06/11/2022 Mammogram   Bilateral diagnostic mammogram showed suspicious left breast mass at 1 o'clock position.  Ultrasound-guided biopsy was recommended.  There were also indeterminate left breast calcs recommend stereotactic biopsy.  No suspicious left axillary adenopathy.  Right breast normal   06/20/2022 Pathology Results   Pathology results showed grade 2 invasive ductal carcinoma prognostics from the first specimen at 1:00 10 cm from the nipple showed ER 100% positive strong staining PR 20% positive moderate to strong staining and Ki-67 of 10%, tumor cells negative for HER2 1+.  Second biopsy which was from upper outer quadrant coil shaped clip also showed invasive ductal carcinoma grade 2 prognostics for this 1 showed ER 95% positive strong staining PR 0% negative Ki-67 of 15% and HER2 2+ by IHC and FISH negative    06/25/2022 Initial Diagnosis   Malignant neoplasm of upper-outer quadrant of left breast in female, estrogen receptor positive ([place])   06/26/2022 Cancer Staging   Staging form: Breast, AJCC 8th Edition - Clinical stage from 06/26/2022: Stage IA (cT1b, cN0, cM0, G2, ER+, PR+, HER2-) - Signed by Hayden Pedro, PA-C on 06/26/2022 Stage prefix: Initial diagnosis Method of lymph node assessment: Clinical Histologic grading system: 3 grade system      RISK FACTORS:  Menarche was at age 27.  First live birth at age 7.  OCP use for approximately 5 years.  Ovaries intact: yes.  Uterus intact: yes.  Menopausal status: postmenopausal.  HRT use: 0 years. Colonoscopy: yes;  2022 . Mammogram within the last year: yes. Any excessive radiation exposure in the past: no  Past Medical History:  Diagnosis Date   Breast cancer ([place])    Depression    Hypertension    Rheumatoid arthritis ([place])     Past Surgical History:  Procedure Laterality Date   BREAST BIOPSY Left 06/20/2022   [place] LT BREAST BX W LOC DEV 1ST LESION IMG BX SPEC US GUIDE 06/20/2022 [facility]   BREAST BIOPSY Left 06/20/2022   MM LT BREAST BX W LOC DEV 1ST LESION IMAGE BX SPEC STEREO GUIDE 06/20/2022 [facility]   GASTRIC BYPASS     LASIK      Social History   Socioeconomic History   Marital status: Married    Spouse name: Not on file   Number of children: Not on file   Years of education: Not on file   Highest education  level: Not on file  Occupational History   Not on file  Tobacco Use   Smoking status: Never   Smokeless tobacco: Never  Vaping Use   Vaping Use: Never used  Substance and Sexual Activity   Alcohol use: Yes    Comment: Rare   Drug use: Never   Sexual activity: Yes    Partners: Male  Other Topics Concern   Not on file  Social History Narrative   Not on file   Social Determinants of Health   Financial Resource Strain: Low Risk  (06/26/2022)   Overall Financial Resource  Strain (CARDIA)    Difficulty of Paying Living Expenses: Not very hard  Food Insecurity: No Food Insecurity (06/26/2022)   Hunger Vital Sign    Worried About Running Out of Food in the Last Year: Never true    Ran Out of Food in the Last Year: Never true  Transportation Needs: No Transportation Needs (06/26/2022)   PRAPARE - Hydrologist (Medical): No    Lack of Transportation (Non-Medical): No  Physical Activity: Not on file  Stress: Not on file  Social Connections: Not on file     FAMILY HISTORY:  We obtained a detailed, 4-generation family history.  Significant diagnoses are listed below: Family History  Problem Relation Age of Onset   Heart failure Mother    Breast cancer Mother 15     Ms. Bayon's mother was diagnosed with breast cancer at age 67. Of note, she has limited information about her paternal family medical history. Ms. Sebastian is unaware of previous family history of genetic testing for hereditary cancer risks. There is no reported Ashkenazi Jewish ancestry.  GENETIC COUNSELING ASSESSMENT: Ms. Zehm is a 67 y.o. female with a personal and family history of cancer which is somewhat suggestive of a hereditary predisposition to cancer. We, therefore, discussed and recommended the following at today's visit.   DISCUSSION: We discussed that 5 - 10% of cancer is hereditary, with most cases of breast cancer associated with BRCA1/2.  There are other genes that can be associated with hereditary breast cancer syndromes.  We discussed that testing is beneficial for several reasons including knowing how to follow individuals after completing their treatment, identifying whether potential treatment options would be beneficial, and understanding if other family members could be at risk for cancer and allowing them to undergo genetic testing.   We reviewed the characteristics, features and inheritance patterns of hereditary cancer syndromes. We also  discussed genetic testing, including the appropriate family members to test, the process of testing, insurance coverage and turn-around-time for results. We discussed the implications of a negative, positive, carrier and/or variant of uncertain significant result. We recommended Ms. Liuzza pursue genetic testing for a panel that includes genes associated with breast cancer.   Ms. Jauregui elected to have [facility] Custom Cancer Panel. The Custom Hereditary Cancers Panel offered by [facility] includes sequencing and/or deletion duplication testing of the following 43 genes: APC, ATM, AXIN2, BAP1, BARD1, BMPR1A, BRCA1, BRCA2, BRIP1, CDH1, CDK4, CDKN2A (p14ARF and p16INK4a only), CHEK2, CTNNA1, EPCAM (Deletion/duplication testing only), FH, GREM1 (promoter region duplication testing only), HOXB13, KIT, MBD4, MEN1, MLH1, MSH2, MSH3, MSH6, MUTYH, NF1, NHTL1, PALB2, PDGFRA, PMS2, POLD1, POLE, PTEN, RAD51C, RAD51D, SMAD4, SMARCA4. STK11, TP53, TSC1, TSC2, and VHL.  Based on Ms. Ewalt's personal and family history of cancer, she does not meet medical criteria for genetic testing. She may have an out of pocket cost. We discussed that if her  out of pocket cost for testing is over $100, the laboratory will call and confirm whether she wants to proceed with testing.  If the out of pocket cost of testing is less than $100 she will be billed by the genetic testing laboratory.   PLAN: After considering the risks, benefits, and limitations, Ms. Sugiura provided informed consent to pursue genetic testing and the blood sample was sent to [facility] for analysis of the Custom Panel. Results should be available within approximately 2-3 weeks' time, at which point they will be disclosed by telephone to Ms. Roubideaux, as will any additional recommendations warranted by these results. Ms. Gotay will receive a summary of her genetic counseling visit and a copy of her results once available. This information will also be  available in Epic.   Ms. Luebbering questions were answered to her satisfaction today. Our contact information was provided should additional questions or concerns arise. Thank you for the referral and allowing [place] to share in the care of your patient.   Lucille Passy, MS, [facility] Genetic Counselor [email or web address] (P) [phone]  The patient was seen for a total of 20 minutes in face-to-face genetic counseling. The patient was seen alone.  Drs. Lindi Adie and/or Burr Medico were available to discuss this case as needed.   _______________________________________________________________________ For Office Staff:  Number of people involved in session: 1 Was an Intern/ student involved with case: no

## 2022-06-28 ENCOUNTER — Other Ambulatory Visit: Payer: Self-pay | Admitting: Surgery

## 2022-06-28 DIAGNOSIS — C50912 Malignant neoplasm of unspecified site of left female breast: Secondary | ICD-10-CM

## 2022-07-02 ENCOUNTER — Encounter: Payer: Self-pay | Admitting: *Deleted

## 2022-07-02 ENCOUNTER — Telehealth: Payer: Self-pay | Admitting: *Deleted

## 2022-07-02 NOTE — Telephone Encounter (Signed)
Spoke to pt concerning bmdc from 06/26/22. Denies questions or concerns regarding dx or treatment care plan. Encourage pt to call with needs. Received verbal understanding.

## 2022-07-04 DIAGNOSIS — H25813 Combined forms of age-related cataract, bilateral: Secondary | ICD-10-CM | POA: Diagnosis not present

## 2022-07-04 DIAGNOSIS — H25812 Combined forms of age-related cataract, left eye: Secondary | ICD-10-CM | POA: Diagnosis not present

## 2022-07-05 ENCOUNTER — Ambulatory Visit: Payer: Self-pay | Admitting: Genetic Counselor

## 2022-07-05 ENCOUNTER — Encounter: Payer: Self-pay | Admitting: Genetic Counselor

## 2022-07-05 DIAGNOSIS — Z1379 Encounter for other screening for genetic and chromosomal anomalies: Secondary | ICD-10-CM | POA: Insufficient documentation

## 2022-07-05 NOTE — Progress Notes (Signed)
HPI:   Linda Wood was previously seen in the [facility] due to a personal and family history of cancer and concerns regarding a hereditary predisposition to cancer. Please refer to our prior cancer genetics clinic note for more information regarding our discussion, assessment and recommendations, at the time. Linda Wood recent genetic test results were disclosed to Linda Wood, as were recommendations warranted by these results. These results and recommendations are discussed in more detail below.  CANCER HISTORY:  Oncology History  Malignant neoplasm of upper-outer quadrant of left breast in female, estrogen receptor positive ([name])  06/11/2022 Mammogram   Bilateral diagnostic mammogram showed suspicious left breast mass at 1 o'clock position.  Ultrasound-guided biopsy was recommended.  There were also indeterminate left breast calcs recommend stereotactic biopsy.  No suspicious left axillary adenopathy.  Right breast normal   06/20/2022 Pathology Results   Pathology results showed grade 2 invasive ductal carcinoma prognostics from the first specimen at 1:00 10 cm from the nipple showed ER 100% positive strong staining PR 20% positive moderate to strong staining and Ki-67 of 10%, tumor cells negative for HER2 1+.  Second biopsy which was from upper outer quadrant coil shaped clip also showed invasive ductal carcinoma grade 2 prognostics for this 1 showed ER 95% positive strong staining PR 0% negative Ki-67 of 15% and HER2 2+ by IHC and FISH negative   06/25/2022 Initial Diagnosis   Malignant neoplasm of upper-outer quadrant of left breast in female, estrogen receptor positive ([name])   06/26/2022 Cancer Staging   Staging form: Breast, AJCC 8th Edition - Clinical stage from 06/26/2022: Stage IA (cT1b, cN0, cM0, G2, ER+, PR+, HER2-) - Signed by Hayden Pedro, PA-C on 06/26/2022 Stage prefix: Initial diagnosis Method of lymph node assessment: Clinical Histologic grading system:  3 grade system     FAMILY HISTORY:  We obtained a detailed, 4-generation family history.  Significant diagnoses are listed below:      Family History  Problem Relation Age of Onset   Heart failure Mother     Breast cancer Mother 62       Linda Wood's mother was diagnosed with breast cancer at age 68. Of note, Linda Wood has limited information about Linda Wood paternal family medical history. Linda Wood is unaware of previous family history of genetic testing for hereditary cancer risks. There is no reported Ashkenazi Jewish ancestry.  GENETIC TEST RESULTS:  The [facility] Custom Cancer Panel found no pathogenic mutations.   The Custom Hereditary Cancers Panel offered by [facility] includes sequencing and/or deletion duplication testing of the following 43 genes: APC, ATM, AXIN2, BAP1, BARD1, BMPR1A, BRCA1, BRCA2, BRIP1, CDH1, CDK4, CDKN2A (p14ARF and p16INK4a only), CHEK2, CTNNA1, EPCAM (Deletion/duplication testing only), FH, GREM1 (promoter region duplication testing only), HOXB13, KIT, MBD4, MEN1, MLH1, MSH2, MSH3, MSH6, MUTYH, NF1, NHTL1, PALB2, PDGFRA, PMS2, POLD1, POLE, PTEN, RAD51C, RAD51D, SMAD4, SMARCA4. STK11, TP53, TSC1, TSC2, and VHL.  The test report has been scanned into EPIC and is located under the Molecular Pathology section of the Results Review tab.  A portion of the result report is included below for reference. Genetic testing reported out on 07/04/2022.       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  At this time, it is unknown if this variant is associated with an increased risk for cancer or if it is benign, but most uncertain variants are reclassified to benign. It should not be used to make medical management  decisions. With time, we suspect the laboratory will determine the significance of this variant, if any. If the laboratory reclassifies this variant, we will attempt to contact Linda Wood to discuss it further.    Even though a pathogenic variant was not identified, possible explanations for the cancer in the family may include: There may be no hereditary risk for cancer in the family. The cancers in Linda Wood and/or Linda Wood family may be due to other genetic or environmental factors. There may be a gene mutation in one of these genes that current testing methods cannot detect, but that chance is small. There could be another gene that has not yet been discovered, or that we have not yet tested, that is responsible for the cancer diagnoses in the family.   Therefore, it is important to remain in touch with cancer genetics in the future so that we can continue to offer Linda Wood the most up to date genetic testing.   ADDITIONAL GENETIC TESTING:  We discussed with Linda Wood that Linda Wood genetic testing was fairly extensive.  If there are genes identified to increase cancer risk that can be analyzed in the future, we would be happy to discuss and coordinate this testing at that time.    CANCER SCREENING RECOMMENDATIONS:  Linda Wood test result is considered negative (normal).  This means that we have not identified a hereditary cause for Linda Wood personal and family history of cancer at this time. An individual's cancer risk and medical management are not determined by genetic test results alone. Overall cancer risk assessment incorporates additional factors, including personal medical history, family history, and any available genetic information that may result in a personalized plan for cancer prevention and surveillance. Therefore, it is recommended Linda Wood continue to follow the cancer management and screening guidelines provided by Linda Wood healthcare providers.  RECOMMENDATIONS FOR FAMILY MEMBERS:   Since Linda Wood did not inherit a mutation in a cancer predisposition gene included on this panel, Linda Wood children could not have inherited a mutation from Linda Wood in one of these genes. Individuals in this family might be at some  increased risk of developing cancer, over the general population risk, due to the family history of cancer. We recommend women in this family have a yearly mammogram beginning at age 78, or 24 years younger than the earliest onset of cancer, an annual clinical breast exam, and perform monthly breast self-exams.  We do not recommend familial testing for the NF1 variant of uncertain significance (VUS).  FOLLOW-UP:  Cancer genetics is a rapidly advancing field and it is possible that new genetic tests will be appropriate for Linda Wood and/or Linda Wood family members in the future. We encouraged Linda Wood to remain in contact with cancer genetics on an annual basis so we can update Linda Wood personal and family histories and let Linda Wood know of advances in cancer genetics that may benefit this family.   Our contact number was provided. Linda Wood questions were answered to Linda Wood satisfaction, and Linda Wood knows Linda Wood is welcome to call us at anytime with additional questions or concerns.   Lucille Passy, MS, [facility] Genetic Counselor [email or web address] (P) [phone]

## 2022-07-09 NOTE — Pre-Procedure Instructions (Signed)
Surgical Instructions    Your procedure is scheduled on Tuesday 07/16/22.   Report to [facility] Main Entrance "A" at 05:30 A.M., then check in with the Admitting office.  Call this number if you have problems the morning of surgery:  [phone]   If you have any questions prior to your surgery date call [phone]: Open Monday-Friday 8am-4pm If you experience any cold or flu symptoms such as cough, fever, chills, shortness of breath, etc. between now and your scheduled surgery, please notify us at the above number     Remember:  Do not eat after midnight the night before your surgery  You may drink clear liquids until 04:30 A.M. the morning of your surgery.   Clear liquids allowed are: Water, Non-Citrus Juices (without pulp), Carbonated Beverages, Clear Tea, Black Coffee ONLY (NO MILK, CREAM OR POWDERED CREAMER of any kind), and Gatorade  Patient Instructions  The night before surgery:  No food after midnight. ONLY clear liquids after midnight  The day of surgery (if you do NOT have diabetes):  Drink ONE (1) Pre-Surgery Clear Ensure by 04:30 A.M. the morning of surgery. Drink in one sitting. Do not sip.  This drink was given to you during your hospital  pre-op appointment visit.  Nothing else to drink after completing the  Pre-Surgery Clear Ensure.         If you have questions, please contact your surgeon's office.     Take these medicines the morning of surgery with A SIP OF WATER:   escitalopram (LEXAPRO)   hydroxychloroquine (PLAQUENIL)    As of today, STOP taking any Aspirin (unless otherwise instructed by your surgeon) Aleve, Naproxen, Ibuprofen, Motrin, Advil, Goody's, BC's, all herbal medications, fish oil, and all vitamins.           Do not wear jewelry or makeup. Do not wear lotions, powders, perfumes/cologne or deodorant. Do not shave 48 hours prior to surgery.  Men may shave face and neck. Do not bring valuables to the hospital. Do not wear nail polish,  gel polish, artificial nails, or any other type of covering on natural nails (fingers and toes) If you have artificial nails or gel coating that need to be removed by a nail salon, please have this removed prior to surgery. Artificial nails or gel coating may interfere with anesthesia's ability to adequately monitor your vital signs.  [facility] is not responsible for any belongings or valuables.    Do NOT Smoke (Tobacco/Vaping)  24 hours prior to your procedure  If you use a CPAP at night, you may bring your mask for your overnight stay.   Contacts, glasses, hearing aids, dentures or partials may not be worn into surgery, please bring cases for these belongings   For patients admitted to the hospital, discharge time will be determined by your treatment team.   Patients discharged the day of surgery will not be allowed to drive home, and someone needs to stay with them for 24 hours.   SURGICAL WAITING ROOM VISITATION Patients having surgery or a procedure may have no more than 2 support people in the waiting area - these visitors may rotate.   Children under the age of 28 must have an adult with them who is not the patient. If the patient needs to stay at the hospital during part of their recovery, the visitor guidelines for inpatient rooms apply. Pre-op nurse will coordinate an appropriate time for 1 support person to accompany patient in pre-op.  This support  person may not rotate.   Please refer to [email or web address] for the visitor guidelines for Inpatients (after your surgery is over and you are in a regular room).    Special instructions:    Oral Hygiene is also important to reduce your risk of infection.  Remember - BRUSH YOUR TEETH THE MORNING OF SURGERY WITH YOUR REGULAR TOOTHPASTE   [facility]- Preparing For Surgery  Before surgery, you can play an important role. Because skin is not sterile, your skin needs to be as free  of germs as possible. You can reduce the number of germs on your skin by washing with CHG (chlorahexidine gluconate) Soap before surgery.  CHG is an antiseptic cleaner which kills germs and bonds with the skin to continue killing germs even after washing.     Please do not use if you have an allergy to CHG or antibacterial soaps. If your skin becomes reddened/irritated stop using the CHG.  Do not shave (including legs and underarms) for at least 48 hours prior to first CHG shower. It is OK to shave your face.  Please follow these instructions carefully.     Shower the NIGHT BEFORE SURGERY and the MORNING OF SURGERY with CHG Soap.   If you chose to wash your hair, wash your hair first as usual with your normal shampoo. After you shampoo, rinse your hair and body thoroughly to remove the shampoo.  Then [employer] and genitals (private parts) with your normal soap and rinse thoroughly to remove soap.  After that Use CHG Soap as you would any other liquid soap. You can apply CHG directly to the skin and wash gently with a scrungie or a clean washcloth.   Apply the CHG Soap to your body ONLY FROM THE NECK DOWN.  Do not use on open wounds or open sores. Avoid contact with your eyes, ears, mouth and genitals (private parts). Wash Face and genitals (private parts)  with your normal soap.   Wash thoroughly, paying special attention to the area where your surgery will be performed.  Thoroughly rinse your body with warm water from the neck down.  DO NOT shower/wash with your normal soap after using and rinsing off the CHG Soap.  Pat yourself dry with a CLEAN TOWEL.  Wear CLEAN PAJAMAS to bed the night before surgery  Place CLEAN SHEETS on your bed the night before your surgery  DO NOT SLEEP WITH PETS.   Day of Surgery:  Take a shower with CHG soap. Wear Clean/Comfortable clothing the morning of surgery Do not apply any deodorants/lotions.   Remember to brush your teeth WITH YOUR REGULAR  TOOTHPASTE.    If you received a COVID test during your pre-op visit, it is requested that you wear a mask when out in public, stay away from anyone that may not be feeling well, and notify your surgeon if you develop symptoms. If you have been in contact with anyone that has tested positive in the last 10 days, please notify your surgeon.    Please read over the following fact sheets that you were given.

## 2022-07-10 ENCOUNTER — Other Ambulatory Visit: Payer: Self-pay

## 2022-07-10 ENCOUNTER — Encounter (HOSPITAL_COMMUNITY): Payer: Self-pay

## 2022-07-10 ENCOUNTER — Encounter (HOSPITAL_COMMUNITY)
Admission: RE | Admit: 2022-07-10 | Discharge: 2022-07-10 | Disposition: A | Discharge status: Home / Self Care | Payer: Medicare HMO | Source: Ambulatory Visit | Attending: Surgery | Admitting: Surgery

## 2022-07-10 VITALS — BP 146/91 | HR 77 | Temp 97.6°F | Resp 18 | Ht 62.0 in | Wt 153.6 lb

## 2022-07-10 DIAGNOSIS — I10 Essential (primary) hypertension: Secondary | ICD-10-CM | POA: Diagnosis not present

## 2022-07-10 DIAGNOSIS — Z01810 Encounter for preprocedural cardiovascular examination: Secondary | ICD-10-CM | POA: Insufficient documentation

## 2022-07-10 HISTORY — DX: Anxiety disorder, unspecified: F41.9

## 2022-07-10 NOTE — Progress Notes (Signed)
PCP - Dr. Shelda Pal, [place] Cardiologist - Denies  PPM/ICD - Denies  Chest x-ray - n/a EKG - Today at PAT, 07/10/2022 Stress Test - Denies ECHO -Denies Cardiac Cath - Denies  Sleep Study - Denies CPAP - Denies  Non-diabetic  Last dose of GLP1 agonist-  n/a GLP1 instructions: n/a  Blood Thinner Instructions: Denies Aspirin Instructions: Denies  ERAS Protcol - yes PRE-SURGERY Ensure   COVID TEST- Denies   Anesthesia review: Yes, seed lumpectomy  Patient denies shortness of breath, fever, cough and chest pain at PAT appointment   All instructions explained to the patient, with a verbal understanding of the material. Patient agrees to go over the instructions while at home for a better understanding. Patient also instructed to self quarantine after being tested for COVID-19. The opportunity to ask questions was provided.

## 2022-07-11 ENCOUNTER — Telehealth: Payer: Self-pay | Admitting: Genetic Counselor

## 2022-07-11 NOTE — Telephone Encounter (Signed)
I attempted to contact Linda Wood to discuss her genetic testing results (43 genes). I left a voicemail requesting she call me back at [phone].  Lucille Passy, MS, [facility] Genetic Counselor [email or web address] (P) [phone]

## 2022-07-15 ENCOUNTER — Telehealth: Payer: Self-pay | Admitting: Genetic Counselor

## 2022-07-15 ENCOUNTER — Ambulatory Visit
Admission: RE | Admit: 2022-07-15 | Discharge: 2022-07-15 | Disposition: A | Discharge status: Home / Self Care | Payer: Medicare HMO | Source: Ambulatory Visit | Attending: Surgery | Admitting: Surgery

## 2022-07-15 DIAGNOSIS — C50912 Malignant neoplasm of unspecified site of left female breast: Secondary | ICD-10-CM

## 2022-07-15 HISTORY — PX: BREAST BIOPSY: SHX20

## 2022-07-15 NOTE — Progress Notes (Signed)
Nuc med called to confirm surgery time of 0730. They will be here around [phone].

## 2022-07-15 NOTE — Telephone Encounter (Addendum)
I contacted Ms. Favret to discuss her genetic testing results. No pathogenic variants were identified in the 43 genes analyzed. 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 Detailed clinic note to follow.  The test report has been scanned into EPIC and is located under the Molecular Pathology section of the Results Review tab.  A portion of the result report is included below for reference.   Lucille Passy, MS, [facility] Genetic Counselor [email or web address] (P) [phone]

## 2022-07-15 NOTE — Anesthesia Preprocedure Evaluation (Addendum)
Anesthesia Evaluation  Patient identified by MRN, date of birth, ID band Patient awake    Reviewed: Allergy & Precautions, NPO status , Patient's Chart, lab work & pertinent test results  Airway Mallampati: II  TM Distance: >3 FB Neck ROM: Full    Dental  (+) Dental Advisory Given, Teeth Intact   Pulmonary neg pulmonary ROS   Pulmonary exam normal breath sounds clear to auscultation       Cardiovascular hypertension, Pt. on medications  Rhythm:Regular Rate:Normal + Systolic murmurs    Neuro/Psych  PSYCHIATRIC DISORDERS Anxiety Depression    negative neurological ROS     GI/Hepatic negative GI ROS, Neg liver ROS,,,  Endo/Other  negative endocrine ROS    Renal/GU negative Renal ROS     Musculoskeletal  (+) Arthritis ,    Abdominal   Peds  Hematology negative hematology ROS (+)   Anesthesia Other Findings   Reproductive/Obstetrics                              Anesthesia Physical Anesthesia Plan  ASA: 3  Anesthesia Plan: General   Post-op Pain Management: Tylenol PO (pre-op)* and Regional block*   Induction: Intravenous  PONV Risk Score and Plan: 4 or greater and Ondansetron, Dexamethasone and Treatment may vary due to age or medical condition  Airway Management Planned: LMA  Additional Equipment:   Intra-op Plan:   Post-operative Plan: Extubation in OR  Informed Consent: I have reviewed the patients History and Physical, chart, labs and discussed the procedure including the risks, benefits and alternatives for the proposed anesthesia with the patient or authorized representative who has indicated his/her understanding and acceptance.     Dental advisory given  Plan Discussed with: CRNA  Anesthesia Plan Comments:         Anesthesia Quick Evaluation

## 2022-07-16 ENCOUNTER — Ambulatory Visit (HOSPITAL_COMMUNITY): Payer: Medicare HMO | Admitting: Emergency Medicine

## 2022-07-16 ENCOUNTER — Other Ambulatory Visit: Payer: Self-pay

## 2022-07-16 ENCOUNTER — Ambulatory Visit
Admission: RE | Admit: 2022-07-16 | Discharge: 2022-07-16 | Disposition: A | Discharge status: Home / Self Care | Payer: Medicare HMO | Source: Ambulatory Visit | Attending: Surgery | Admitting: Surgery

## 2022-07-16 ENCOUNTER — Other Ambulatory Visit (HOSPITAL_BASED_OUTPATIENT_CLINIC_OR_DEPARTMENT_OTHER): Payer: Self-pay

## 2022-07-16 ENCOUNTER — Encounter (HOSPITAL_COMMUNITY): Payer: Self-pay | Admitting: Surgery

## 2022-07-16 ENCOUNTER — Encounter (HOSPITAL_COMMUNITY)
Admission: RE | Disposition: A | Discharge status: Home / Self Care | Payer: Self-pay | Source: Home / Self Care | Attending: Surgery

## 2022-07-16 ENCOUNTER — Ambulatory Visit (HOSPITAL_COMMUNITY)
Admission: RE | Admit: 2022-07-16 | Discharge: 2022-07-16 | Disposition: A | Discharge status: Home / Self Care | Payer: Medicare HMO | Source: Ambulatory Visit | Attending: Surgery | Admitting: Surgery

## 2022-07-16 ENCOUNTER — Ambulatory Visit (HOSPITAL_COMMUNITY)
Admission: RE | Admit: 2022-07-16 | Discharge: 2022-07-16 | Disposition: A | Discharge status: Home / Self Care | Payer: Medicare HMO | Attending: Surgery | Admitting: Surgery

## 2022-07-16 DIAGNOSIS — D0512 Intraductal carcinoma in situ of left breast: Secondary | ICD-10-CM | POA: Insufficient documentation

## 2022-07-16 DIAGNOSIS — C50912 Malignant neoplasm of unspecified site of left female breast: Secondary | ICD-10-CM

## 2022-07-16 DIAGNOSIS — R928 Other abnormal and inconclusive findings on diagnostic imaging of breast: Secondary | ICD-10-CM | POA: Diagnosis not present

## 2022-07-16 DIAGNOSIS — F419 Anxiety disorder, unspecified: Secondary | ICD-10-CM | POA: Insufficient documentation

## 2022-07-16 DIAGNOSIS — F32A Depression, unspecified: Secondary | ICD-10-CM | POA: Diagnosis not present

## 2022-07-16 DIAGNOSIS — Z87891 Personal history of nicotine dependence: Secondary | ICD-10-CM | POA: Insufficient documentation

## 2022-07-16 DIAGNOSIS — G8918 Other acute postprocedural pain: Secondary | ICD-10-CM | POA: Diagnosis not present

## 2022-07-16 DIAGNOSIS — C50412 Malignant neoplasm of upper-outer quadrant of left female breast: Secondary | ICD-10-CM | POA: Diagnosis not present

## 2022-07-16 DIAGNOSIS — I10 Essential (primary) hypertension: Secondary | ICD-10-CM | POA: Insufficient documentation

## 2022-07-16 DIAGNOSIS — F418 Other specified anxiety disorders: Secondary | ICD-10-CM

## 2022-07-16 DIAGNOSIS — Z170 Estrogen receptor positive status [ER+]: Secondary | ICD-10-CM | POA: Diagnosis not present

## 2022-07-16 DIAGNOSIS — N641 Fat necrosis of breast: Secondary | ICD-10-CM | POA: Diagnosis not present

## 2022-07-16 DIAGNOSIS — M1990 Unspecified osteoarthritis, unspecified site: Secondary | ICD-10-CM | POA: Diagnosis not present

## 2022-07-16 HISTORY — PX: BREAST LUMPECTOMY WITH RADIOACTIVE SEED AND SENTINEL LYMPH NODE BIOPSY: SHX6550

## 2022-07-16 SURGERY — BREAST LUMPECTOMY WITH RADIOACTIVE SEED AND SENTINEL LYMPH NODE BIOPSY
Anesthesia: General | Site: Breast | Laterality: Left

## 2022-07-16 MED ORDER — MIDAZOLAM HCL 2 MG/2ML IJ SOLN
INTRAMUSCULAR | Status: AC
  Filled 2022-07-16: qty 2

## 2022-07-16 MED ORDER — OXYCODONE HCL 5 MG PO TABS: 5.0000 mg | ORAL_TABLET | Freq: Once | ORAL | Status: DC | PRN

## 2022-07-16 MED ORDER — 0.9 % SODIUM CHLORIDE (POUR BTL) OPTIME
TOPICAL | Status: DC | PRN
  Administered 2022-07-16: 1000 mL

## 2022-07-16 MED ORDER — TECHNETIUM TC 99M TILMANOCEPT KIT
1.0000 | PACK | Freq: Once | INTRAVENOUS | Status: AC | PRN
  Administered 2022-07-16: 1 via INTRADERMAL

## 2022-07-16 MED ORDER — LACTATED RINGERS IV SOLN: INTRAVENOUS | Status: DC

## 2022-07-16 MED ORDER — ROCURONIUM BROMIDE 10 MG/ML (PF) SYRINGE
PREFILLED_SYRINGE | INTRAVENOUS | Status: AC
  Filled 2022-07-16: qty 10

## 2022-07-16 MED ORDER — LIDOCAINE 2% (20 MG/ML) 5 ML SYRINGE
INTRAMUSCULAR | Status: DC | PRN
  Administered 2022-07-16: 70 mg via INTRAVENOUS

## 2022-07-16 MED ORDER — PROPOFOL 10 MG/ML IV BOLUS
INTRAVENOUS | Status: DC | PRN
  Administered 2022-07-16: 30 mg via INTRAVENOUS
  Administered 2022-07-16: 120 mg via INTRAVENOUS

## 2022-07-16 MED ORDER — EPHEDRINE 5 MG/ML INJ
INTRAVENOUS | Status: AC
  Filled 2022-07-16: qty 5

## 2022-07-16 MED ORDER — AMISULPRIDE (ANTIEMETIC) 5 MG/2ML IV SOLN: 10.0000 mg | Freq: Once | INTRAVENOUS | Status: DC | PRN

## 2022-07-16 MED ORDER — PHENYLEPHRINE 80 MCG/ML (10ML) SYRINGE FOR IV PUSH (FOR BLOOD PRESSURE SUPPORT)
PREFILLED_SYRINGE | INTRAVENOUS | Status: DC | PRN
  Administered 2022-07-16: 80 ug via INTRAVENOUS
  Administered 2022-07-16: 160 ug via INTRAVENOUS
  Administered 2022-07-16 (×5): 80 ug via INTRAVENOUS

## 2022-07-16 MED ORDER — DEXAMETHASONE SODIUM PHOSPHATE 4 MG/ML IJ SOLN
INTRAMUSCULAR | Status: DC | PRN
  Administered 2022-07-16: 5 mg via PERINEURAL

## 2022-07-16 MED ORDER — ORAL CARE MOUTH RINSE: 15.0000 mL | Freq: Once | OROMUCOSAL | Status: AC

## 2022-07-16 MED ORDER — CHLORHEXIDINE GLUCONATE 0.12 % MT SOLN
15.0000 mL | Freq: Once | OROMUCOSAL | Status: AC
  Administered 2022-07-16: 15 mL via OROMUCOSAL
  Filled 2022-07-16: qty 15

## 2022-07-16 MED ORDER — CEFAZOLIN SODIUM-DEXTROSE 2-4 GM/100ML-% IV SOLN
2.0000 g | INTRAVENOUS | Status: AC
  Administered 2022-07-16: 2 g via INTRAVENOUS
  Filled 2022-07-16: qty 100

## 2022-07-16 MED ORDER — MIDAZOLAM HCL 2 MG/2ML IJ SOLN
INTRAMUSCULAR | Status: DC | PRN
  Administered 2022-07-16 (×2): 1 mg via INTRAVENOUS

## 2022-07-16 MED ORDER — OXYCODONE HCL 5 MG/5ML PO SOLN: 5.0000 mg | Freq: Once | ORAL | Status: DC | PRN

## 2022-07-16 MED ORDER — CHLORHEXIDINE GLUCONATE CLOTH 2 % EX PADS: 6.0000 | MEDICATED_PAD | Freq: Once | CUTANEOUS | Status: DC

## 2022-07-16 MED ORDER — PROPOFOL 10 MG/ML IV BOLUS
INTRAVENOUS | Status: AC
  Filled 2022-07-16: qty 20

## 2022-07-16 MED ORDER — DEXAMETHASONE SODIUM PHOSPHATE 10 MG/ML IJ SOLN
INTRAMUSCULAR | Status: DC | PRN
  Administered 2022-07-16: 10 mg via INTRAVENOUS

## 2022-07-16 MED ORDER — FENTANYL CITRATE (PF) 250 MCG/5ML IJ SOLN
INTRAMUSCULAR | Status: AC
  Filled 2022-07-16: qty 5

## 2022-07-16 MED ORDER — BUPIVACAINE-EPINEPHRINE 0.25% -1:200000 IJ SOLN
INTRAMUSCULAR | Status: DC | PRN
  Administered 2022-07-16: 10 mL

## 2022-07-16 MED ORDER — BUPIVACAINE-EPINEPHRINE (PF) 0.25% -1:200000 IJ SOLN
INTRAMUSCULAR | Status: AC
  Filled 2022-07-16: qty 30

## 2022-07-16 MED ORDER — EPHEDRINE SULFATE-NACL 50-0.9 MG/10ML-% IV SOSY
PREFILLED_SYRINGE | INTRAVENOUS | Status: DC | PRN
  Administered 2022-07-16 (×5): 5 mg via INTRAVENOUS

## 2022-07-16 MED ORDER — LIDOCAINE 2% (20 MG/ML) 5 ML SYRINGE
INTRAMUSCULAR | Status: AC
  Filled 2022-07-16: qty 5

## 2022-07-16 MED ORDER — ROPIVACAINE HCL 5 MG/ML IJ SOLN
INTRAMUSCULAR | Status: DC | PRN
  Administered 2022-07-16: 30 mL via PERINEURAL

## 2022-07-16 MED ORDER — FENTANYL CITRATE (PF) 250 MCG/5ML IJ SOLN
INTRAMUSCULAR | Status: DC | PRN
  Administered 2022-07-16: 25 ug via INTRAVENOUS
  Administered 2022-07-16 (×2): 50 ug via INTRAVENOUS
  Administered 2022-07-16: 25 ug via INTRAVENOUS

## 2022-07-16 MED ORDER — HYDROMORPHONE HCL 1 MG/ML IJ SOLN: 0.2500 mg | INTRAMUSCULAR | Status: DC | PRN

## 2022-07-16 MED ORDER — OXYCODONE HCL 5 MG PO TABS
5.0000 mg | ORAL_TABLET | Freq: Four times a day (QID) | ORAL | 0 refills | Status: DC | PRN
  Filled 2022-07-16: qty 15, 4d supply, fill #0

## 2022-07-16 MED ORDER — ACETAMINOPHEN 500 MG PO TABS
1000.0000 mg | ORAL_TABLET | ORAL | Status: AC
  Administered 2022-07-16: 1000 mg via ORAL
  Filled 2022-07-16: qty 2

## 2022-07-16 MED ORDER — CLONIDINE HCL (ANALGESIA) 100 MCG/ML EP SOLN
EPIDURAL | Status: DC | PRN
  Administered 2022-07-16: 80 ug

## 2022-07-16 MED ORDER — MEPERIDINE HCL 25 MG/ML IJ SOLN: 6.2500 mg | INTRAMUSCULAR | Status: DC | PRN

## 2022-07-16 MED ORDER — ONDANSETRON HCL 4 MG/2ML IJ SOLN
INTRAMUSCULAR | Status: AC
  Filled 2022-07-16: qty 2

## 2022-07-16 MED ORDER — ONDANSETRON HCL 4 MG/2ML IJ SOLN
INTRAMUSCULAR | Status: DC | PRN
  Administered 2022-07-16: 4 mg via INTRAVENOUS

## 2022-07-16 SURGICAL SUPPLY — 47 items
APL PRP STRL LF DISP 70% ISPRP (MISCELLANEOUS) ×1
APL SKNCLS STERI-STRIP NONHPOA (GAUZE/BANDAGES/DRESSINGS) ×1
APPLIER CLIP 9.375 MED OPEN (MISCELLANEOUS) ×1
APR CLP MED 9.3 20 MLT OPN (MISCELLANEOUS) ×1
BAG COUNTER SPONGE SURGICOUNT (BAG) ×1 IMPLANT
BAG SPNG CNTER NS LX DISP (BAG) ×1
BENZOIN TINCTURE PRP APPL 2/3 (GAUZE/BANDAGES/DRESSINGS) ×1 IMPLANT
BINDER BREAST LRG (GAUZE/BANDAGES/DRESSINGS) IMPLANT
BINDER BREAST XLRG (GAUZE/BANDAGES/DRESSINGS) IMPLANT
CANISTER SUCT 3000ML PPV (MISCELLANEOUS) ×1 IMPLANT
CHLORAPREP W/TINT 26 (MISCELLANEOUS) ×1 IMPLANT
CLIP APPLIE 9.375 MED OPEN (MISCELLANEOUS) ×1 IMPLANT
CNTNR URN SCR LID CUP LEK RST (MISCELLANEOUS) ×1 IMPLANT
CONT SPEC 4OZ STRL OR WHT (MISCELLANEOUS) ×1
COVER PROBE W GEL 5X96 (DRAPES) ×1 IMPLANT
COVER SURGICAL LIGHT HANDLE (MISCELLANEOUS) ×1 IMPLANT
DEVICE DUBIN SPECIMEN MAMMOGRA (MISCELLANEOUS) ×1 IMPLANT
DRAPE CHEST BREAST 15X10 FENES (DRAPES) ×1 IMPLANT
DRSG TEGADERM 4X4.75 (GAUZE/BANDAGES/DRESSINGS) ×2 IMPLANT
ELECT CAUTERY BLADE 6.4 (BLADE) ×1 IMPLANT
ELECT REM PT RETURN 9FT ADLT (ELECTROSURGICAL) ×1
ELECTRODE REM PT RTRN 9FT ADLT (ELECTROSURGICAL) ×1 IMPLANT
GAUZE SPONGE 2X2 8PLY STRL LF (GAUZE/BANDAGES/DRESSINGS) ×1 IMPLANT
GAUZE SPONGE 2X2 STRL 8-PLY (GAUZE/BANDAGES/DRESSINGS) IMPLANT
GLOVE BIO SURGEON STRL SZ7 (GLOVE) ×1 IMPLANT
GLOVE BIOGEL PI IND STRL 7.5 (GLOVE) ×1 IMPLANT
GOWN STRL REUS W/ TWL LRG LVL3 (GOWN DISPOSABLE) ×2 IMPLANT
GOWN STRL REUS W/TWL LRG LVL3 (GOWN DISPOSABLE) ×2
KIT BASIN OR (CUSTOM PROCEDURE TRAY) ×1 IMPLANT
KIT MARKER MARGIN INK (KITS) IMPLANT
LIGHT WAVEGUIDE WIDE FLAT (MISCELLANEOUS) IMPLANT
NDL 18GX1X1/2 (RX/OR ONLY) (NEEDLE) ×1 IMPLANT
NDL FILTER BLUNT 18X1 1/2 (NEEDLE) ×1 IMPLANT
NDL HYPO 25GX1X1/2 BEV (NEEDLE) ×2 IMPLANT
NEEDLE 18GX1X1/2 (RX/OR ONLY) (NEEDLE) ×1 IMPLANT
NEEDLE FILTER BLUNT 18X1 1/2 (NEEDLE) ×1 IMPLANT
NEEDLE HYPO 25GX1X1/2 BEV (NEEDLE) ×2 IMPLANT
NS IRRIG 1000ML POUR BTL (IV SOLUTION) ×1 IMPLANT
PACK GENERAL/GYN (CUSTOM PROCEDURE TRAY) ×1 IMPLANT
SPONGE T-LAP 4X18 ~~LOC~~+RFID (SPONGE) ×1 IMPLANT
STRIP CLOSURE SKIN 1/2X4 (GAUZE/BANDAGES/DRESSINGS) ×1 IMPLANT
SUT MNCRL AB 4-0 PS2 18 (SUTURE) ×2 IMPLANT
SUT VIC AB 3-0 SH 27 (SUTURE) ×2
SUT VIC AB 3-0 SH 27X BRD (SUTURE) ×2 IMPLANT
SYR CONTROL 10ML LL (SYRINGE) ×2 IMPLANT
TOWEL GREEN STERILE (TOWEL DISPOSABLE) ×1 IMPLANT
TOWEL GREEN STERILE FF (TOWEL DISPOSABLE) ×1 IMPLANT

## 2022-07-16 NOTE — Anesthesia Postprocedure Evaluation (Signed)
Anesthesia Post Note  Patient: Linda Wood  Procedure(s) Performed: LEFT BREAST LUMPECTOMY WITH RADIOACTIVE SEED X2 AND AXILLARY SENTINEL LYMPH NODE BIOPSY (Left: Breast)     Patient location during evaluation: PACU Anesthesia Type: General Level of consciousness: sedated and patient cooperative Pain management: pain level controlled Vital Signs Assessment: post-procedure vital signs reviewed and stable Respiratory status: spontaneous breathing Cardiovascular status: stable Anesthetic complications: no   No notable events documented.  Last Vitals:  Vitals:   07/16/22 0915 07/16/22 0927  BP: 132/66 130/69  Pulse: 90 91  Resp: (!) 24 (!) 21  Temp:  36.7 C  SpO2: 94% 97%    Last Pain:  Vitals:   07/16/22 0927  TempSrc:   PainSc: 0-No pain                 Nolon Nations

## 2022-07-16 NOTE — Anesthesia Procedure Notes (Signed)
Anesthesia Regional Block: Pectoralis block   Pre-Anesthetic Checklist: , timeout performed,  Correct Patient, Correct Site, Correct Laterality,  Correct Procedure, Correct Position, site marked,  Risks and benefits discussed,  Surgical consent,  Pre-op evaluation,  At surgeon's request and post-op pain management  Laterality: Left  Prep: chloraprep       Needles:   Needle Type: Stimiplex     Needle Length: 9cm      Additional Needles:   Procedures:,,,, ultrasound used (permanent image in chart),,    Narrative:  Start time: 07/16/2022 6:44 AM End time: 07/16/2022 7:04 AM Injection made incrementally with aspirations every 5 mL.  Performed by: Personally  Anesthesiologist: Nolon Nations, MD  Additional Notes: BP cuff, SpO2 and EKG monitors applied. Sedation begun.  Anesthetic injected incrementally, slowly, and after neg aspirations under direct ultrasound guidance. Good fascial spread noted. Patient tolerated well.

## 2022-07-16 NOTE — Anesthesia Procedure Notes (Signed)
Procedure Name: LMA Insertion Date/Time: 07/16/2022 7:35 AM  Performed by: Harden Mo, CRNAPre-anesthesia Checklist: Patient identified, Emergency Drugs available, Suction available and Patient being monitored Patient Re-evaluated:Patient Re-evaluated prior to induction Oxygen Delivery Method: Circle System Utilized Preoxygenation: Pre-oxygenation with 100% oxygen Induction Type: IV induction Ventilation: Mask ventilation without difficulty LMA: LMA inserted LMA Size: 4.0 Number of attempts: 1 Airway Equipment and Method: Bite block Placement Confirmation: positive ETCO2 Tube secured with: Tape Dental Injury: Teeth and Oropharynx as per pre-operative assessment

## 2022-07-16 NOTE — Interval H&P Note (Signed)
History and Physical Interval Note:  07/16/2022 6:54 AM  Linda Wood  has presented today for surgery, with the diagnosis of LEFT BREAST MULTIFOCAL INVASIVE DUCTAL CARCINOMA.  The various methods of treatment have been discussed with the patient and family. After consideration of risks, benefits and other options for treatment, the patient has consented to  Procedure(s): LEFT BREAST LUMPECTOMY WITH RADIOACTIVE SEED X2 AND AXILLARY SENTINEL LYMPH NODE BIOPSY (Left) as a surgical intervention.  The patient's history has been reviewed, patient examined, no change in status, stable for surgery.  I have reviewed the patient's chart and labs.  Questions were answered to the patient's satisfaction.     [name]

## 2022-07-16 NOTE — Transfer of Care (Signed)
Immediate Anesthesia Transfer of Care Note  Patient: Linda Wood  Procedure(s) Performed: LEFT BREAST LUMPECTOMY WITH RADIOACTIVE SEED X2 AND AXILLARY SENTINEL LYMPH NODE BIOPSY (Left: Breast)  Patient Location: PACU  Anesthesia Type:General and Regional  Level of Consciousness: awake, alert , and oriented  Airway & Oxygen Therapy: Patient Spontanous Breathing  Post-op Assessment: Report given to RN, Post -op Vital signs reviewed and stable, and Patient moving all extremities X 4  Post vital signs: Reviewed and stable  Last Vitals:  Vitals Value Taken Time  BP 133/65   Temp    Pulse 99 07/16/22 0848  Resp 20   SpO2 96 % 07/16/22 0848  Vitals shown include unvalidated device data.  Last Pain:  Vitals:   07/16/22 0619  TempSrc:   PainSc: 0-No pain         Complications: No notable events documented.

## 2022-07-16 NOTE — Discharge Instructions (Signed)
[facility] Office Phone Number [phone]  BREAST BIOPSY/ PARTIAL MASTECTOMY: POST OP INSTRUCTIONS  Always review your discharge instruction sheet given to you by the facility where your surgery was performed.  IF YOU HAVE DISABILITY OR FAMILY LEAVE FORMS, YOU MUST BRING THEM TO THE OFFICE FOR PROCESSING.  DO NOT GIVE THEM TO YOUR DOCTOR.  A prescription for pain medication may be given to you upon discharge.  Take your pain medication as prescribed, if needed.  If narcotic pain medicine is not needed, then you may take acetaminophen (Tylenol) or ibuprofen (Advil) as needed. Take your usually prescribed medications unless otherwise directed If you need a refill on your pain medication, please contact your pharmacy.  They will contact our office to request authorization.  Prescriptions will not be filled after 5pm or on week-ends. You should eat very light the first 24 hours after surgery, such as soup, crackers, pudding, etc.  Resume your normal diet the day after surgery. Most patients will experience some swelling and bruising in the breast.  Ice packs and a good support bra will help.  Swelling and bruising can take several days to resolve.  It is common to experience some constipation if taking pain medication after surgery.  Increasing fluid intake and taking a stool softener will usually help or prevent this problem from occurring.  A mild laxative (Milk of Magnesia or Miralax) should be taken according to package directions if there are no bowel movements after 48 hours. Unless discharge instructions indicate otherwise, you may remove your bandages 24-48 hours after surgery, and you may shower at that time.  You may have steri-strips (small skin tapes) in place directly over the incision.  These strips should be left on the skin for 7-10 days.  If your surgeon used skin glue on the incision, you may shower in 24 hours.  The glue will flake off over the next 2-3 weeks.  Any  sutures or staples will be removed at the office during your follow-up visit. ACTIVITIES:  You may resume regular daily activities (gradually increasing) beginning the next day.  Wearing a good support bra or sports bra minimizes pain and swelling.  You may have sexual intercourse when it is comfortable. You may drive when you no longer are taking prescription pain medication, you can comfortably wear a seatbelt, and you can safely maneuver your car and apply brakes. RETURN TO WORK:  ______________________________________________________________________________________ You should see your doctor in the office for a follow-up appointment approximately two weeks after your surgery.  Your doctor's nurse will typically make your follow-up appointment when she calls you with your pathology report.  Expect your pathology report 2-3 business days after your surgery.  You may call to check if you do not hear from us after three days. OTHER INSTRUCTIONS: _______________________________________________________________________________________________ _____________________________________________________________________________________________________________________________________ _____________________________________________________________________________________________________________________________________ _____________________________________________________________________________________________________________________________________  WHEN TO CALL YOUR DOCTOR: Fever over 101.0 Nausea and/or vomiting. Extreme swelling or bruising. Continued bleeding from incision. Increased pain, redness, or drainage from the incision.  The clinic staff is available to answer your questions during regular business hours.  Please don't hesitate to call and ask to speak to one of the nurses for clinical concerns.  If you have a medical emergency, go to the nearest emergency room or call 911.  A surgeon from [facility] is always on call at the hospital.  For further questions, please visit [email or web address]   

## 2022-07-16 NOTE — Op Note (Addendum)
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 Anesthesia:  GEN - LMA/ PEC block Indications:  This is a 67 year old female who recently underwent routine screening mammogram and follow-up of left breast calcifications.  She was found to have a mass located at 1:00 in the left breast, 10 cm from the nipple.  This measured 7 x 6 x 7 mm.  She also had some adjacent calcifications.  The mass was biopsied under ultrasound guidance.  The calcifications underwent stereotactic biopsy.   Pathology of the mass showed invasive ductal carcinoma grade 2, ER/PR positive, HER2 negative, Ki-67 10%.  The calcifications revealed invasive ductal carcinoma grade 2, ER positive, PR negative, HER2 negative, Ki-67 15%.  After consultation, we are planning left breast radioactive seed localized lumpectomy x two with sentinel lymph node biopsy.  Radioactive seeds were placed yesterday in the lateral left breast.  She was injected with radiotracer in the pre-operative area.  Description of procedure: The patient is brought to the operating room placed in supine position on the operating room table. After an adequate level of general anesthesia was obtained, her left breast and axilla were prepped with ChloraPrep and draped in sterile fashion. A timeout was taken to ensure the proper patient and proper procedure. We interrogated the breast with the neoprobe. The two seeds in the lateral left breast are only 3 cm apart, so it was difficult to differentiate between the two seeds with the Neoprobe.  We made a transverse incision over the area of activity after infiltrating with 0.25% Marcaine. Dissection was carried down in the breast tissue with cautery. We used the neoprobe to guide [place] towards the radioactive seeds. We excised an area of tissue around the radioactive  seeds. The specimen was removed and was oriented with a paint kit. Specimen mammogram showed both radioactive seeds as well as the biopsy clips within the specimen. This was sent for pathologic examination. There is no residual radioactivity within the biopsy cavity. I excised additional posterior, medial, and inferior margins which were also oriented with the paint kit and sent to pathology.  Clips were placed in all five margins.  We inspected carefully for hemostasis. The wound was thoroughly irrigated.   We then turned our attention to the axilla.  The settings were adjusted on the Neoprobe and we interrogated the axilla.  I was able to identify an area of activity through the same lateral breast incision by moving our retractor superiorly.  We dissected into the axilla and identified a radioactive lymph node in the deep axilla (level 2).   This was dissected free and sent as "sentinel lymph node #1".  We interrogated the axilla again and there was minimal background activity.  The wounds were closed with a deep layer of 3-0 Vicryl and a subcuticular layer of 4-0 Monocryl. Benzoin Steri-Strips were applied. The patient was then extubated and brought to the recovery room in stable condition. All sponge, instrument, and needle counts are correct.  Imogene Burn. Georgette Dover, MD, [facility]  General/ Trauma Surgery  03/20/2020 1:38 PM

## 2022-07-17 ENCOUNTER — Encounter (HOSPITAL_COMMUNITY): Payer: Self-pay | Admitting: Surgery

## 2022-07-17 DIAGNOSIS — C50912 Malignant neoplasm of unspecified site of left female breast: Secondary | ICD-10-CM | POA: Diagnosis not present

## 2022-07-18 LAB — SURGICAL PATHOLOGY

## 2022-07-22 ENCOUNTER — Ambulatory Visit: Payer: Self-pay | Admitting: Genetic Counselor

## 2022-07-22 ENCOUNTER — Encounter: Payer: Self-pay | Admitting: Genetic Counselor

## 2022-07-22 ENCOUNTER — Telehealth: Payer: Self-pay | Admitting: *Deleted

## 2022-07-22 DIAGNOSIS — Z1379 Encounter for other screening for genetic and chromosomal anomalies: Secondary | ICD-10-CM

## 2022-07-22 NOTE — Progress Notes (Signed)
HPI:   Linda Wood was previously seen in the [facility] [facility] due to a personal and family history of cancer and concerns regarding a hereditary predisposition to cancer. Please refer to our prior cancer genetics clinic note for more information regarding our discussion, assessment and recommendations, at the time. Linda Wood recent genetic test results were disclosed to Linda Wood, as were recommendations warranted by these results. These results and recommendations are discussed in more detail below.  CANCER HISTORY:  Oncology History  Malignant neoplasm of upper-outer quadrant of left breast in female, estrogen receptor positive ([place])  06/11/2022 Mammogram   Bilateral diagnostic mammogram showed suspicious left breast mass at 1 o'clock position.  Ultrasound-guided biopsy was recommended.  There were also indeterminate left breast calcs recommend stereotactic biopsy.  No suspicious left axillary adenopathy.  Right breast normal   06/20/2022 Pathology Results   Pathology results showed grade 2 invasive ductal carcinoma prognostics from the first specimen at 1:00 10 cm from the nipple showed ER 100% positive strong staining PR 20% positive moderate to strong staining and Ki-67 of 10%, tumor cells negative for HER2 1+.  Second biopsy which was from upper outer quadrant coil shaped clip also showed invasive ductal carcinoma grade 2 prognostics for this 1 showed ER 95% positive strong staining PR 0% negative Ki-67 of 15% and HER2 2+ by IHC and FISH negative   06/25/2022 Initial Diagnosis   Malignant neoplasm of upper-outer quadrant of left breast in female, estrogen receptor positive ([place])   06/26/2022 Cancer Staging   Staging form: Breast, AJCC 8th Edition - Clinical stage from 06/26/2022: Stage IA (cT1b, cN0, cM0, G2, ER+, PR+, HER2-) - Signed by Hayden Pedro, PA-C on 06/26/2022 Stage prefix: Initial diagnosis Method of lymph node assessment: Clinical Histologic grading system:  3 grade system    Genetic Testing   [facility] Custom Panel+RNA was Negative. 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 Report date was 07/04/2022.  The Custom Hereditary Cancers Panel offered by [facility] includes sequencing and/or deletion duplication testing of the following 43 genes: APC, ATM, AXIN2, BAP1, BARD1, BMPR1A, BRCA1, BRCA2, BRIP1, CDH1, CDK4, CDKN2A (p14ARF and p16INK4a only), CHEK2, CTNNA1, EPCAM (Deletion/duplication testing only), FH, GREM1 (promoter region duplication testing only), HOXB13, KIT, MBD4, MEN1, MLH1, MSH2, MSH3, MSH6, MUTYH, NF1, NHTL1, PALB2, PDGFRA, PMS2, POLD1, POLE, PTEN, RAD51C, RAD51D, SMAD4, SMARCA4. STK11, TP53, TSC1, TSC2, and VHL.     FAMILY HISTORY:  We obtained a detailed, 4-generation family history.  Significant diagnoses are listed below:          Family History  Problem Relation Age of Onset   Heart failure Mother     Breast cancer Mother 74       Linda Wood's mother was diagnosed with breast cancer at age 35. Of note, she has limited information about Linda Wood paternal family medical history. Linda Wood is unaware of previous family history of genetic testing for hereditary cancer risks. There is no reported Ashkenazi Jewish ancestry.  GENETIC TEST RESULTS:  The [facility] Custom Panel found no pathogenic mutations.   The Custom Hereditary Cancers Panel offered by [facility] includes sequencing and/or deletion duplication testing of the following 43 genes: APC, ATM, AXIN2, BAP1, BARD1, BMPR1A, BRCA1, BRCA2, BRIP1, CDH1, CDK4, CDKN2A (p14ARF and p16INK4a only), CHEK2, CTNNA1, EPCAM (Deletion/duplication testing only), FH, GREM1 (promoter region duplication testing only), HOXB13, KIT, MBD4, MEN1, MLH1, MSH2, MSH3, MSH6, MUTYH, NF1, NHTL1, PALB2, PDGFRA, PMS2, POLD1, POLE, PTEN, RAD51C, RAD51D,  SMAD4, SMARCA4. STK11, TP53, TSC1, TSC2, and VHL.  The test report has been scanned into EPIC and is located under the  Molecular Pathology section of the Results Review tab.  A portion of the result report is included below for reference. Genetic testing reported out on 07/04/2022.      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  At this time, it is unknown if this variant is associated with an increased risk for cancer or if it is benign, but most uncertain variants are reclassified to benign. It should not be used to make medical management decisions. With time, we suspect the laboratory will determine the significance of this variant, if any. If the laboratory reclassifies this variant, we will attempt to contact Linda Wood to discuss it further.   Even though a pathogenic variant was not identified, possible explanations for the cancer in the family may include: There may be no hereditary risk for cancer in the family. The cancers in Linda Wood and/or Linda Wood family may be due to other genetic or environmental factors. There may be a gene mutation in one of these genes that current testing methods cannot detect, but that chance is small. There could be another gene that has not yet been discovered, or that we have not yet tested, that is responsible for the cancer diagnoses in the family.   Therefore, it is important to remain in touch with cancer genetics in the future so that we can continue to offer Linda Wood the most up to date genetic testing.   ADDITIONAL GENETIC TESTING:  We discussed with Linda Wood that Linda Wood genetic testing was fairly extensive.  If there are genes identified to increase cancer risk that can be analyzed in the future, we would be happy to discuss and coordinate this testing at that time.     CANCER SCREENING RECOMMENDATIONS:  Linda Wood test result is considered negative (normal).  This means that we have not identified a hereditary cause for Linda Wood personal and family history of cancer at this time.   An individual's cancer  risk and medical management are not determined by genetic test results alone. Overall cancer risk assessment incorporates additional factors, including personal medical history, family history, and any available genetic information that may result in a personalized plan for cancer prevention and surveillance. Therefore, it is recommended she continue to follow the cancer management and screening guidelines provided by Linda Wood oncology and primary healthcare provider.  RECOMMENDATIONS FOR FAMILY MEMBERS:   Since she did not inherit a mutation in a cancer predisposition gene included on this panel, Linda Wood children could not have inherited a mutation from Linda Wood in one of these genes. Individuals in this family might be at some increased risk of developing cancer, over the general population risk, due to the family history of cancer. We recommend women in this family have a yearly mammogram beginning at age 31, or 36 years younger than the earliest onset of cancer, an annual clinical breast exam, and perform monthly breast self-exams. We do not recommend familial testing for the NF1 variant of uncertain significance (VUS).  FOLLOW-UP:  Cancer genetics is a rapidly advancing field and it is possible that new genetic tests will be appropriate for Linda Wood and/or Linda Wood family members in the future. We encouraged Linda Wood to remain in contact with cancer genetics on an annual basis so we can update Linda Wood personal and family histories and let Linda Wood know of advances in cancer  genetics that may benefit this family.   Our contact number was provided. Ms. Mcvaugh questions were answered to Linda Wood satisfaction, and she knows she is welcome to call us at anytime with additional questions or concerns.   Lucille Passy, MS, [facility] Genetic Counselor [email or web address] (P) [phone]

## 2022-07-22 NOTE — Telephone Encounter (Signed)
Received order for oncotype testing. Requisition faxed to pathology and [facility] °

## 2022-07-23 ENCOUNTER — Ambulatory Visit: Payer: Self-pay | Admitting: Surgery

## 2022-07-23 ENCOUNTER — Other Ambulatory Visit: Payer: Self-pay

## 2022-07-23 ENCOUNTER — Encounter (HOSPITAL_COMMUNITY): Payer: Self-pay | Admitting: Surgery

## 2022-07-23 NOTE — Anesthesia Preprocedure Evaluation (Signed)
Anesthesia Evaluation  Patient identified by MRN, date of birth, ID band Patient awake    Reviewed: Allergy & Precautions, NPO status , Patient's Chart, lab work & pertinent test results  History of Anesthesia Complications Negative for: history of anesthetic complications  Airway Mallampati: II  TM Distance: >3 FB Neck ROM: Full    Dental no notable dental hx.    Pulmonary neg pulmonary ROS   Pulmonary exam normal        Cardiovascular hypertension, Pt. on medications Normal cardiovascular exam     Neuro/Psych   Anxiety Depression       GI/Hepatic negative GI ROS, Neg liver ROS,,,  Endo/Other  negative endocrine ROS    Renal/GU negative Renal ROS     Musculoskeletal  (+) Arthritis , Rheumatoid disorders,    Abdominal   Peds  Hematology negative hematology ROS (+)   Anesthesia Other Findings Left breast ca  Reproductive/Obstetrics negative OB ROS                              Anesthesia Physical Anesthesia Plan  ASA: 2  Anesthesia Plan: General   Post-op Pain Management: Tylenol PO (pre-op)*   Induction: Intravenous  PONV Risk Score and Plan: 3 and Treatment may vary due to age or medical condition, Ondansetron, Midazolam and Dexamethasone  Airway Management Planned: LMA  Additional Equipment: None  Intra-op Plan:   Post-operative Plan: Extubation in OR  Informed Consent: I have reviewed the patients History and Physical, chart, labs and discussed the procedure including the risks, benefits and alternatives for the proposed anesthesia with the patient or authorized representative who has indicated his/her understanding and acceptance.     Dental advisory given  Plan Discussed with: CRNA  Anesthesia Plan Comments:         Anesthesia Quick Evaluation

## 2022-07-24 ENCOUNTER — Ambulatory Visit (HOSPITAL_BASED_OUTPATIENT_CLINIC_OR_DEPARTMENT_OTHER): Payer: Medicare HMO | Admitting: Certified Registered"

## 2022-07-24 ENCOUNTER — Ambulatory Visit (HOSPITAL_COMMUNITY)
Admission: RE | Admit: 2022-07-24 | Discharge: 2022-07-24 | Disposition: A | Discharge status: Home / Self Care | Payer: Medicare HMO | Attending: Surgery | Admitting: Surgery

## 2022-07-24 ENCOUNTER — Ambulatory Visit (HOSPITAL_COMMUNITY): Payer: Medicare HMO | Admitting: Certified Registered"

## 2022-07-24 ENCOUNTER — Encounter (HOSPITAL_COMMUNITY)
Admission: RE | Disposition: A | Discharge status: Home / Self Care | Payer: Self-pay | Source: Home / Self Care | Attending: Surgery

## 2022-07-24 ENCOUNTER — Encounter: Payer: Self-pay | Admitting: *Deleted

## 2022-07-24 DIAGNOSIS — I10 Essential (primary) hypertension: Secondary | ICD-10-CM | POA: Insufficient documentation

## 2022-07-24 DIAGNOSIS — Z79899 Other long term (current) drug therapy: Secondary | ICD-10-CM | POA: Insufficient documentation

## 2022-07-24 DIAGNOSIS — C50412 Malignant neoplasm of upper-outer quadrant of left female breast: Secondary | ICD-10-CM | POA: Diagnosis not present

## 2022-07-24 DIAGNOSIS — C50912 Malignant neoplasm of unspecified site of left female breast: Secondary | ICD-10-CM | POA: Insufficient documentation

## 2022-07-24 DIAGNOSIS — Z170 Estrogen receptor positive status [ER+]: Secondary | ICD-10-CM | POA: Diagnosis not present

## 2022-07-24 DIAGNOSIS — D0512 Intraductal carcinoma in situ of left breast: Secondary | ICD-10-CM | POA: Diagnosis not present

## 2022-07-24 HISTORY — PX: RE-EXCISION OF BREAST LUMPECTOMY: SHX6048

## 2022-07-24 SURGERY — EXCISION, LESION, BREAST
Anesthesia: General | Site: Breast | Laterality: Left

## 2022-07-24 MED ORDER — FENTANYL CITRATE (PF) 250 MCG/5ML IJ SOLN
INTRAMUSCULAR | Status: DC | PRN
  Administered 2022-07-24 (×2): 50 ug via INTRAVENOUS

## 2022-07-24 MED ORDER — CHLORHEXIDINE GLUCONATE 0.12 % MT SOLN: 15.0000 mL | Freq: Once | OROMUCOSAL | Status: DC

## 2022-07-24 MED ORDER — DEXAMETHASONE SODIUM PHOSPHATE 10 MG/ML IJ SOLN
INTRAMUSCULAR | Status: AC
  Filled 2022-07-24: qty 1

## 2022-07-24 MED ORDER — MIDAZOLAM HCL 2 MG/2ML IJ SOLN
INTRAMUSCULAR | Status: AC
  Filled 2022-07-24: qty 2

## 2022-07-24 MED ORDER — OXYCODONE HCL 5 MG PO TABS
ORAL_TABLET | ORAL | Status: AC
  Filled 2022-07-24: qty 1

## 2022-07-24 MED ORDER — 0.9 % SODIUM CHLORIDE (POUR BTL) OPTIME
TOPICAL | Status: DC | PRN
  Administered 2022-07-24: 1000 mL

## 2022-07-24 MED ORDER — PROPOFOL 10 MG/ML IV BOLUS
INTRAVENOUS | Status: DC | PRN
  Administered 2022-07-24: 30 mg via INTRAVENOUS
  Administered 2022-07-24: 130 mg via INTRAVENOUS

## 2022-07-24 MED ORDER — BUPIVACAINE HCL (PF) 0.25 % IJ SOLN
INTRAMUSCULAR | Status: AC
  Filled 2022-07-24: qty 30

## 2022-07-24 MED ORDER — ACETAMINOPHEN 500 MG PO TABS
1000.0000 mg | ORAL_TABLET | ORAL | Status: AC
  Administered 2022-07-24: 1000 mg via ORAL
  Filled 2022-07-24: qty 2

## 2022-07-24 MED ORDER — CHLORHEXIDINE GLUCONATE 0.12 % MT SOLN
OROMUCOSAL | Status: AC
  Administered 2022-07-24: 15 mL
  Filled 2022-07-24: qty 15

## 2022-07-24 MED ORDER — MIDAZOLAM HCL 2 MG/2ML IJ SOLN
INTRAMUSCULAR | Status: DC | PRN
  Administered 2022-07-24: 2 mg via INTRAVENOUS

## 2022-07-24 MED ORDER — FENTANYL CITRATE (PF) 100 MCG/2ML IJ SOLN: 25.0000 ug | INTRAMUSCULAR | Status: DC | PRN

## 2022-07-24 MED ORDER — PROPOFOL 10 MG/ML IV BOLUS
INTRAVENOUS | Status: AC
  Filled 2022-07-24: qty 20

## 2022-07-24 MED ORDER — CHLORHEXIDINE GLUCONATE CLOTH 2 % EX PADS: 6.0000 | MEDICATED_PAD | Freq: Once | CUTANEOUS | Status: DC

## 2022-07-24 MED ORDER — ONDANSETRON HCL 4 MG/2ML IJ SOLN
INTRAMUSCULAR | Status: DC | PRN
  Administered 2022-07-24: 4 mg via INTRAVENOUS

## 2022-07-24 MED ORDER — LIDOCAINE 2% (20 MG/ML) 5 ML SYRINGE
INTRAMUSCULAR | Status: DC | PRN
  Administered 2022-07-24: 60 mg via INTRAVENOUS

## 2022-07-24 MED ORDER — OXYCODONE HCL 5 MG/5ML PO SOLN: 5.0000 mg | Freq: Once | ORAL | Status: AC | PRN

## 2022-07-24 MED ORDER — LIDOCAINE 2% (20 MG/ML) 5 ML SYRINGE
INTRAMUSCULAR | Status: AC
  Filled 2022-07-24: qty 5

## 2022-07-24 MED ORDER — ORAL CARE MOUTH RINSE: 15.0000 mL | Freq: Once | OROMUCOSAL | Status: DC

## 2022-07-24 MED ORDER — LACTATED RINGERS IV SOLN: INTRAVENOUS | Status: DC

## 2022-07-24 MED ORDER — CEFAZOLIN SODIUM-DEXTROSE 2-4 GM/100ML-% IV SOLN
2.0000 g | INTRAVENOUS | Status: AC
  Administered 2022-07-24: 2 g via INTRAVENOUS
  Filled 2022-07-24: qty 100

## 2022-07-24 MED ORDER — FENTANYL CITRATE (PF) 250 MCG/5ML IJ SOLN
INTRAMUSCULAR | Status: AC
  Filled 2022-07-24: qty 5

## 2022-07-24 MED ORDER — AMISULPRIDE (ANTIEMETIC) 5 MG/2ML IV SOLN: 10.0000 mg | Freq: Once | INTRAVENOUS | Status: DC | PRN

## 2022-07-24 MED ORDER — ONDANSETRON HCL 4 MG/2ML IJ SOLN
INTRAMUSCULAR | Status: AC
  Filled 2022-07-24: qty 2

## 2022-07-24 MED ORDER — DEXAMETHASONE SODIUM PHOSPHATE 10 MG/ML IJ SOLN
INTRAMUSCULAR | Status: DC | PRN
  Administered 2022-07-24: 4 mg via INTRAVENOUS

## 2022-07-24 MED ORDER — OXYCODONE HCL 5 MG PO TABS
5.0000 mg | ORAL_TABLET | Freq: Once | ORAL | Status: AC | PRN
  Administered 2022-07-24: 5 mg via ORAL

## 2022-07-24 SURGICAL SUPPLY — 43 items
APL PRP STRL LF DISP 70% ISPRP (MISCELLANEOUS) ×1
APL SKNCLS STERI-STRIP NONHPOA (GAUZE/BANDAGES/DRESSINGS) ×1
APPLIER CLIP 9.375 MED OPEN (MISCELLANEOUS) ×1
APR CLP MED 9.3 20 MLT OPN (MISCELLANEOUS) ×1
BAG COUNTER SPONGE SURGICOUNT (BAG) ×1 IMPLANT
BAG SPNG CNTER NS LX DISP (BAG) ×1
BENZOIN TINCTURE PRP APPL 2/3 (GAUZE/BANDAGES/DRESSINGS) ×1 IMPLANT
BINDER BREAST LRG (GAUZE/BANDAGES/DRESSINGS) IMPLANT
BINDER BREAST XLRG (GAUZE/BANDAGES/DRESSINGS) IMPLANT
CANISTER SUCT 3000ML PPV (MISCELLANEOUS) ×1 IMPLANT
CHLORAPREP W/TINT 26 (MISCELLANEOUS) ×1 IMPLANT
CLIP APPLIE 9.375 MED OPEN (MISCELLANEOUS) IMPLANT
CLSR STERI-STRIP ANTIMIC 1/2X4 (GAUZE/BANDAGES/DRESSINGS) IMPLANT
COVER PROBE W GEL 5X96 (DRAPES) ×1 IMPLANT
COVER SURGICAL LIGHT HANDLE (MISCELLANEOUS) ×1 IMPLANT
DEVICE DUBIN SPECIMEN MAMMOGRA (MISCELLANEOUS) ×1 IMPLANT
DRAPE CHEST BREAST 15X10 FENES (DRAPES) ×1 IMPLANT
DRSG TEGADERM 4X4.75 (GAUZE/BANDAGES/DRESSINGS) ×1 IMPLANT
ELECT CAUTERY BLADE 6.4 (BLADE) ×1 IMPLANT
ELECT REM PT RETURN 9FT ADLT (ELECTROSURGICAL) ×1
ELECTRODE REM PT RTRN 9FT ADLT (ELECTROSURGICAL) ×1 IMPLANT
GAUZE SPONGE 2X2 8PLY STRL LF (GAUZE/BANDAGES/DRESSINGS) ×1 IMPLANT
GLOVE BIO SURGEON STRL SZ7 (GLOVE) ×1 IMPLANT
GLOVE BIOGEL PI IND STRL 7.5 (GLOVE) ×1 IMPLANT
GOWN STRL REUS W/ TWL LRG LVL3 (GOWN DISPOSABLE) ×2 IMPLANT
GOWN STRL REUS W/TWL LRG LVL3 (GOWN DISPOSABLE) ×2
ILLUMINATOR WAVEGUIDE N/F (MISCELLANEOUS) IMPLANT
KIT BASIN OR (CUSTOM PROCEDURE TRAY) ×1 IMPLANT
KIT MARKER MARGIN INK (KITS) ×1 IMPLANT
LIGHT WAVEGUIDE WIDE FLAT (MISCELLANEOUS) IMPLANT
NDL HYPO 25GX1X1/2 BEV (NEEDLE) ×1 IMPLANT
NEEDLE HYPO 25GX1X1/2 BEV (NEEDLE) ×1 IMPLANT
NS IRRIG 1000ML POUR BTL (IV SOLUTION) ×1 IMPLANT
PACK GENERAL/GYN (CUSTOM PROCEDURE TRAY) ×1 IMPLANT
SPONGE T-LAP 4X18 ~~LOC~~+RFID (SPONGE) ×1 IMPLANT
STRIP CLOSURE SKIN 1/2X4 (GAUZE/BANDAGES/DRESSINGS) ×1 IMPLANT
SUT MNCRL AB 4-0 PS2 18 (SUTURE) ×1 IMPLANT
SUT SILK 2 0 SH (SUTURE) IMPLANT
SUT VIC AB 3-0 SH 27 (SUTURE) ×1
SUT VIC AB 3-0 SH 27X BRD (SUTURE) ×1 IMPLANT
SYR CONTROL 10ML LL (SYRINGE) ×1 IMPLANT
TOWEL GREEN STERILE (TOWEL DISPOSABLE) ×1 IMPLANT
TOWEL GREEN STERILE FF (TOWEL DISPOSABLE) ×1 IMPLANT

## 2022-07-24 NOTE — Anesthesia Postprocedure Evaluation (Signed)
Anesthesia Post Note  Patient: Linda Wood  Procedure(s) Performed: RE-EXCISION OF LEFT BREAST LUMPECTOMY ANTERIOR AND LATERAL MARGINS (Left: Breast)     Patient location during evaluation: PACU Anesthesia Type: General Level of consciousness: awake and alert Pain management: pain level controlled Vital Signs Assessment: post-procedure vital signs reviewed and stable Respiratory status: spontaneous breathing, nonlabored ventilation and respiratory function stable Cardiovascular status: blood pressure returned to baseline Postop Assessment: no apparent nausea or vomiting Anesthetic complications: no   No notable events documented.  Last Vitals:  Vitals:   07/24/22 1200 07/24/22 1215  BP: (!) 150/86 (!) 142/77  Pulse: 67 67  Resp: (!) 29 20  Temp:  36.5 C  SpO2: 98% 96%    Last Pain:  Vitals:   07/24/22 1147  TempSrc:   PainSc: [place]

## 2022-07-24 NOTE — Anesthesia Procedure Notes (Signed)
Procedure Name: LMA Insertion Date/Time: 07/24/2022 11:01 AM  Performed by: Reggie Pile, CRNAPre-anesthesia Checklist: Patient identified, Emergency Drugs available, Suction available, Patient being monitored and Timeout performed Patient Re-evaluated:Patient Re-evaluated prior to induction Oxygen Delivery Method: Circle system utilized Preoxygenation: Pre-oxygenation with 100% oxygen Induction Type: IV induction LMA Size: 4.0 Grade View: Grade I

## 2022-07-24 NOTE — Op Note (Signed)
Pre-op diagnosis: Multifocal invasive ductal carcinoma left breast with positive anterior and lateral margins Postop diagnosis: Same Procedure performed: Reexcision of lateral and anterior margins left breast lumpectomy Surgeon:Frans Valente K Nethaniel Mattie Anesthesia: General Indications: This is a 67 year old female who recently underwent left breast lumpectomy x 2 for multifocal invasive ductal carcinoma of the left breast.  She also had sentinel lymph node biopsy.  All 3 procedures were performed through a single lateral left breast incision.  Pathology revealed a negative sentinel lymph node but the anterior and lateral margins were positive.  She presents now for reexcision.  Description of procedure: The patient is brought to the operating room and placed in the supine position on the operating room table.  After an adequate level general anesthesia was obtained, her left breast was prepped with ChloraPrep and draped sterile fashion.  A timeout was taken to ensure the proper patient and proper procedure.  A scalpel was used to open her old incision.  We dissected into the seroma cavity which was evacuated.  A Wietlaner retractor was placed.  I reexcised an additional centimeter of the lateral margin.  This was oriented with a paint kit.  We then excised an additional 1 cm thickness of anterior margin.  This was also oriented with the paint kit.  These were both sent for pathology.  The wound was thoroughly irrigated and inspected for hemostasis.  We closed the wound with 3-0 Vicryl and 4-0 Monocryl.  Benzoin and Steri-Strips were applied.  The patient was then extubated and brought to the recovery room in stable condition.  All sponge, instrument, and needle counts are correct.  Imogene Burn. Georgette Dover, MD, [facility]  General Surgery   07/24/2022 11:42 AM

## 2022-07-24 NOTE — Interval H&P Note (Signed)
History and Physical Interval Note:  07/24/2022 10:10 AM  Linda Wood  has presented today for surgery, with the diagnosis of INVASIVE DUCTAL CARCINOMA LEFT BREAST.  The various methods of treatment have been discussed with the patient and family. After consideration of risks, benefits and other options for treatment, the patient has consented to  Procedure(s) with comments: RE-EXCISION OF LEFT BREAST LUMPECTOMY ANTERIOR AND LATERAL MARGINS (Left) - 63 MIN ROOM 12 as a surgical intervention.  The patient's history has been reviewed, patient examined, no change in status, stable for surgery.  I have reviewed the patient's chart and labs.  Questions were answered to the patient's satisfaction.     [name]

## 2022-07-24 NOTE — Transfer of Care (Signed)
Immediate Anesthesia Transfer of Care Note  Patient: Linda Wood  Procedure(s) Performed: RE-EXCISION OF LEFT BREAST LUMPECTOMY ANTERIOR AND LATERAL MARGINS (Left: Breast)  Patient Location: PACU  Anesthesia Type:General  Level of Consciousness: awake and alert   Airway & Oxygen Therapy: Patient Spontanous Breathing and Patient connected to nasal cannula oxygen  Post-op Assessment: Report given to RN and Post -op Vital signs reviewed and stable  Post vital signs: Reviewed and stable  Last Vitals:  Vitals Value Taken Time  BP 156/89 07/24/22 1147  Temp    Pulse 76 07/24/22 1148  Resp 24 07/24/22 1148  SpO2 99 % 07/24/22 1148  Vitals shown include unvalidated device data.  Last Pain:  Vitals:   07/24/22 0915  TempSrc:   PainSc: 0-No pain         Complications: No notable events documented.

## 2022-07-24 NOTE — Discharge Instructions (Signed)
[facility] Office Phone Number [phone]  BREAST BIOPSY/ PARTIAL MASTECTOMY: POST OP INSTRUCTIONS  Always review your discharge instruction sheet given to you by the facility where your surgery was performed.  IF YOU HAVE DISABILITY OR FAMILY LEAVE FORMS, YOU MUST BRING THEM TO THE OFFICE FOR PROCESSING.  DO NOT GIVE THEM TO YOUR DOCTOR.  A prescription for pain medication may be given to you upon discharge.  Take your pain medication as prescribed, if needed.  If narcotic pain medicine is not needed, then you may take acetaminophen (Tylenol) or ibuprofen (Advil) as needed. Take your usually prescribed medications unless otherwise directed If you need a refill on your pain medication, please contact your pharmacy.  They will contact our office to request authorization.  Prescriptions will not be filled after 5pm or on week-ends. You should eat very light the first 24 hours after surgery, such as soup, crackers, pudding, etc.  Resume your normal diet the day after surgery. Most patients will experience some swelling and bruising in the breast.  Ice packs and a good support bra will help.  Swelling and bruising can take several days to resolve.  It is common to experience some constipation if taking pain medication after surgery.  Increasing fluid intake and taking a stool softener will usually help or prevent this problem from occurring.  A mild laxative (Milk of Magnesia or Miralax) should be taken according to package directions if there are no bowel movements after 48 hours. Unless discharge instructions indicate otherwise, you may remove your bandages 24-48 hours after surgery, and you may shower at that time.  You may have steri-strips (small skin tapes) in place directly over the incision.  These strips should be left on the skin for 7-10 days.  If your surgeon used skin glue on the incision, you may shower in 24 hours.  The glue will flake off over the next 2-3 weeks.  Any  sutures or staples will be removed at the office during your follow-up visit. ACTIVITIES:  You may resume regular daily activities (gradually increasing) beginning the next day.  Wearing a good support bra or sports bra minimizes pain and swelling.  You may have sexual intercourse when it is comfortable. You may drive when you no longer are taking prescription pain medication, you can comfortably wear a seatbelt, and you can safely maneuver your car and apply brakes. RETURN TO WORK:  ______________________________________________________________________________________ You should see your doctor in the office for a follow-up appointment approximately two weeks after your surgery.  Your doctor's nurse will typically make your follow-up appointment when she calls you with your pathology report.  Expect your pathology report 2-3 business days after your surgery.  You may call to check if you do not hear from us after three days. OTHER INSTRUCTIONS: _______________________________________________________________________________________________ _____________________________________________________________________________________________________________________________________ _____________________________________________________________________________________________________________________________________ _____________________________________________________________________________________________________________________________________  WHEN TO CALL YOUR DOCTOR: Fever over 101.0 Nausea and/or vomiting. Extreme swelling or bruising. Continued bleeding from incision. Increased pain, redness, or drainage from the incision.  The clinic staff is available to answer your questions during regular business hours.  Please don't hesitate to call and ask to speak to one of the nurses for clinical concerns.  If you have a medical emergency, go to the nearest emergency room or call 911.  A surgeon from [facility] is always on call at the hospital.  For further questions, please visit [email or web address]   

## 2022-07-25 ENCOUNTER — Encounter (HOSPITAL_COMMUNITY): Payer: Self-pay | Admitting: Surgery

## 2022-07-29 LAB — SURGICAL PATHOLOGY

## 2022-07-31 ENCOUNTER — Encounter: Payer: Self-pay | Admitting: *Deleted

## 2022-08-01 ENCOUNTER — Encounter (HOSPITAL_COMMUNITY): Payer: Self-pay

## 2022-08-02 DIAGNOSIS — Z170 Estrogen receptor positive status [ER+]: Secondary | ICD-10-CM | POA: Diagnosis not present

## 2022-08-02 DIAGNOSIS — C50412 Malignant neoplasm of upper-outer quadrant of left female breast: Secondary | ICD-10-CM | POA: Diagnosis not present

## 2022-08-05 NOTE — Therapy (Signed)
OUTPATIENT PHYSICAL THERAPY BREAST CANCER POST OP FOLLOW UP   Patient Name: Linda Wood MRN: [id number] DOB:[birth year], 67 y.o., female Today's Date: 08/06/2022  END OF SESSION:  PT End of Session - 08/06/22 1359     Visit Number 2    Number of Visits 2    Date for PT Re-Evaluation 08/06/22    PT Start Time 0200    Activity Tolerance Patient tolerated treatment well    [facility] tasks assessed/performed             Past Medical History:  Diagnosis Date   Anxiety    Breast cancer    Depression    Hypertension    Rheumatoid arthritis    Past Surgical History:  Procedure Laterality Date   BREAST BIOPSY Left 06/20/2022   US LT BREAST BX W LOC DEV 1ST LESION IMG BX SPEC US GUIDE 06/20/2022 [facility]   BREAST BIOPSY Left 06/20/2022   MM LT BREAST BX W LOC DEV 1ST LESION IMAGE BX SPEC STEREO GUIDE 06/20/2022 [facility]   BREAST BIOPSY  07/15/2022   MM LT RADIOACTIVE SEED LOC MAMMO GUIDE 07/15/2022 [facility]   BREAST BIOPSY  07/15/2022   MM LT RADIOACTIVE SEED EA ADD LESION LOC MAMMO GUIDE 07/15/2022 [facility]   BREAST LUMPECTOMY WITH RADIOACTIVE SEED AND SENTINEL LYMPH NODE BIOPSY Left 07/16/2022   Procedure: LEFT BREAST LUMPECTOMY WITH RADIOACTIVE SEED X2 AND AXILLARY SENTINEL LYMPH NODE BIOPSY;  Surgeon: Manus Ruddsuei, Matthew, MD;  Location: [facility] OR;  Service: General;  Laterality: Left;   GASTRIC BYPASS     HAND SURGERY Right    LASIK     RE-EXCISION OF BREAST LUMPECTOMY Left 07/24/2022   Procedure: RE-EXCISION OF LEFT BREAST LUMPECTOMY ANTERIOR AND LATERAL MARGINS;  Surgeon: Manus Ruddsuei, Matthew, MD;  Location: [facility] OR;  Service: General;  Laterality: Left;  45 MIN ROOM 12   Patient Active Problem List   Diagnosis Date Noted   Genetic testing 07/05/2022   Family history of breast cancer 06/27/2022   Malignant neoplasm of upper-outer quadrant of left breast in female, estrogen receptor positive 06/25/2022   Psychophysiological  insomnia 11/28/2021   Rheumatoid arthritis 10/29/2021   Essential hypertension 10/29/2021   Depression, major, single episode, complete remission 10/29/2021   Rib fractures 10/15/2021      REFERRING PROVIDER: Manus Ruddsuei, Matthew, MD  REFERRING DIAG: Left Breast Cancer  THERAPY DIAG:  Malignant neoplasm of upper-outer quadrant of left breast in female, estrogen receptor positive  Abnormal posture  Rationale for Evaluation and Treatment: Rehabilitation  ONSET DATE: 06/20/2022  SUBJECTIVE:  SUBJECTIVE STATEMENT: Doing well and not having much pain. She has not been doing any exercises but feels like her ROM is OK. She does feels swelling in the area of the seroma and it hurts a little.  PERTINENT HISTORY:  Patient was diagnosed on 06/20/2022 with left grade 2 invasive ductal carcinoma breast cancer. It measures 7 mm and is located in the upper outer quadrant. It is ER/PR positive and HER2 negative with a Ki67 of 10% . She had a double  left lumpectomy with SLNB on 07/16/2022, followed by a re-excision on 07/24/2022 to get clear margins  PATIENT GOALS:  Reassess how my recovery is going related to arm function, pain, and swelling.  PAIN:  Are you having pain? No  PRECAUTIONS: Recent Surgery, left UE Lymphedema risk,   ACTIVITY LEVEL / LEISURE: went to the zoo and she did well, she has done some home chores;dishes, laundry, vacuuming.   OBJECTIVE:   PATIENT SURVEYS:  QUICK DASH: 11.36  OBSERVATIONS: Incision area healing well,  Lateral breast swelling with area of induration inferior to incision with defined borders which MD says is seroma. Non tender and no redness at present. Pt advised to look for this and contact MD if she has pain, increased swelling or redness  POSTURE:  Forward head, rounded  shoulders  LYMPHEDEMA ASSESSMENT:   UPPER EXTREMITY AROM/PROM:   A/PROM RIGHT   eval    Shoulder extension 53  Shoulder flexion 133  Shoulder abduction 157  Shoulder internal rotation 76  Shoulder external rotation 86                          (Blank rows = not tested)   A/PROM LEFT   eval LEFT 08/05/2022  Shoulder extension 51 60  Shoulder flexion 136 143  Shoulder abduction 151 160  Shoulder internal rotation 75 78  Shoulder external rotation 72 94                          (Blank rows = not tested)   CERVICAL AROM: All within normal limits   UPPER EXTREMITY STRENGTH: WFL   LYMPHEDEMA ASSESSMENTS:    LANDMARK RIGHT   eval  10 cm proximal to olecranon process 29.8  Olecranon process 22.4  10 cm proximal to ulnar styloid process 20  Just proximal to ulnar styloid process 15  Across hand at thumb web space 17.5  At base of 2nd digit 6.3  (Blank rows = not tested)   LANDMARK LEFT   eval LEFT 08/05/2022  10 cm proximal to olecranon process 28.8 28.9  Olecranon process 23 23.0  10 cm proximal to ulnar styloid process 19.5 19.0  Just proximal to ulnar styloid process 15.2 15.2  Across hand at thumb web space 17.3 17.3  At base of 2nd digit 6.1 5.9  (Blank rows = not tested)    Surgery type/Date: Left lumpectomy with SLNB 07/16/2022, Re-excision 07/24/2022 Number of lymph nodes removed: 0/2 Current/past treatment (chemo, radiation, hormone therapy): pending radiation she thinks, will see radiologist next week Other symptoms:  Heaviness/tightness No Pain No Pitting edema No Infections No Decreased scar mobility Yes Stemmer sign No  PATIENT EDUCATION:  Education details: ABC class, SOZO screens, scar massage, post op exercises during radiation and continued use of compression bra, walking program Person educated: Patient Education method: Explanation and Handouts Education comprehension: verbalized understanding  HOME EXERCISE PROGRAM: Reviewed previously  given post op  HEP.   ASSESSMENT:  CLINICAL IMPRESSION: Pt is s/p left lumpectomy with SLNB on 07/16/2022, and left re-excision to get clear margins on 07/24/2022. She has restored shoulder ROM to WNL, and there is no sign of lymphedema.  Her incisional area is healing well since her second surgery on 07/24/2022, however, there is an area of induration with defined borders near her incision which is not presently painful. A foam pad was made for axillary border of compression bra, and for area of induration to try and get body to absorb fluid.  Pt will benefit from skilled therapeutic intervention to improve on the following deficits: Decreased knowledge of precautions, impaired UE functional use, pain, decreased ROM, postural dysfunction.   PT treatment/interventions: ADL/Self care home management, Therapeutic exercises, Patient/Family education, Self Care, and scar mobilization   GOALS: Goals reviewed with patient? Yes  LONG TERM GOALS:  (STG=LTG)  GOALS Name Target Date  Goal status  1 Pt will demonstrate she has regained full shoulder ROM and function post operatively compared to baselines.  Baseline: 08/06/2022 MET  2     3     4         PLAN:  PT FREQUENCY/DURATION: No further appts set up to to no needs identified at this time  PLAN FOR NEXT SESSION: Pt is Dc'd but was advised to contact us with any questions or concerns. PHYSICAL THERAPY DISCHARGE SUMMARY  Visits from Start of Care: 2  Current functional level related to goals / functional outcomes: Achieved goals established   Remaining deficits: Swelling at lateral breast and induration with defined borders   Education / Equipment: HEP, scar massage, Sozo screens, continue exs and compression bra for duration of radiation and beyond to prevent stiffness, increased swelling  Patient agrees to discharge. Patient goals were met. Patient is being discharged due to meeting the stated rehab goals.   [facility]  [address]  [place] [address]  [phone]  After Breast Cancer Class It is recommended you attend the ABC class to be educated on lymphedema risk reduction. This class is free of charge and lasts for 1 hour. It is a 1-time class. You will need to download the Webex app either on your phone or computer. We will send you a link the night before or the morning of the class. You should be able to click on that link to join the class. This is not a confidential class. You don't have to turn your camera on, but other participants may be able to see your email address.  Scar massage You can begin gentle scar massage to you incision sites. Gently place one hand on the incision and move the skin (without sliding on the skin) in various directions. Do this for a few minutes and then you can gently massage either coconut oil or vitamin E cream into the scars.  Compression garment You should continue wearing your compression bra until you feel like you no longer have swelling.  Home exercise Program Continue doing the exercises you were given until you feel like you can do them without feeling any tightness at the end.   Walking Program Studies show that 30 minutes of walking per day (fast enough to elevate your heart rate) can significantly reduce the risk of a cancer recurrence. If you can't walk due to other medical reasons, we encourage you to find another activity you could do (like a stationary bike or water exercise).  Posture After breast  cancer surgery, people frequently sit with rounded shoulders posture because it puts their incisions on slack and feels better. If you sit like this and scar tissue forms in that position, you can become very tight and have pain sitting or standing with good posture. Try to be aware of your posture and sit and stand up tall to heal properly.  Follow up PT: It is recommended you return every 3 months for the first 3 years following  surgery to be assessed on the SOZO machine for an L-Dex score. This helps prevent clinically significant lymphedema in 95% of patients. These follow up screens are 10 minute appointments that you are not billed for.  Waynette Buttery, PT 08/06/2022, 2:54 PM

## 2022-08-06 ENCOUNTER — Ambulatory Visit: Payer: Medicare HMO | Attending: Surgery

## 2022-08-06 DIAGNOSIS — R293 Abnormal posture: Secondary | ICD-10-CM | POA: Diagnosis not present

## 2022-08-06 DIAGNOSIS — C50412 Malignant neoplasm of upper-outer quadrant of left female breast: Secondary | ICD-10-CM | POA: Diagnosis not present

## 2022-08-06 DIAGNOSIS — Z170 Estrogen receptor positive status [ER+]: Secondary | ICD-10-CM | POA: Diagnosis not present

## 2022-08-07 ENCOUNTER — Encounter: Payer: Self-pay | Admitting: *Deleted

## 2022-08-07 ENCOUNTER — Telehealth: Payer: Self-pay | Admitting: *Deleted

## 2022-08-07 DIAGNOSIS — Z170 Estrogen receptor positive status [ER+]: Secondary | ICD-10-CM

## 2022-08-07 NOTE — Telephone Encounter (Signed)
Received oncotype results of 16/4%. Referral placed for Dr. Mitzi Hansen

## 2022-08-08 ENCOUNTER — Encounter (HOSPITAL_COMMUNITY): Payer: Self-pay

## 2022-08-08 ENCOUNTER — Telehealth: Payer: Self-pay | Admitting: Radiation Oncology

## 2022-08-08 NOTE — Telephone Encounter (Signed)
Called patient to schedule a consultation w. Alison. No answer, LVM for a return call.  

## 2022-08-12 ENCOUNTER — Other Ambulatory Visit: Payer: Self-pay

## 2022-08-12 ENCOUNTER — Inpatient Hospital Stay: Payer: Medicare HMO | Attending: Hematology and Oncology | Admitting: Hematology and Oncology

## 2022-08-12 VITALS — BP 142/86 | HR 74 | Temp 97.9°F | Resp 16 | Ht 62.0 in | Wt 157.6 lb

## 2022-08-12 DIAGNOSIS — M069 Rheumatoid arthritis, unspecified: Secondary | ICD-10-CM | POA: Diagnosis not present

## 2022-08-12 DIAGNOSIS — Z170 Estrogen receptor positive status [ER+]: Secondary | ICD-10-CM | POA: Insufficient documentation

## 2022-08-12 DIAGNOSIS — Z803 Family history of malignant neoplasm of breast: Secondary | ICD-10-CM | POA: Diagnosis not present

## 2022-08-12 DIAGNOSIS — Z79899 Other long term (current) drug therapy: Secondary | ICD-10-CM | POA: Diagnosis not present

## 2022-08-12 DIAGNOSIS — C50412 Malignant neoplasm of upper-outer quadrant of left female breast: Secondary | ICD-10-CM | POA: Diagnosis not present

## 2022-08-12 DIAGNOSIS — M8580 Other specified disorders of bone density and structure, unspecified site: Secondary | ICD-10-CM | POA: Insufficient documentation

## 2022-08-12 DIAGNOSIS — Z9884 Bariatric surgery status: Secondary | ICD-10-CM | POA: Insufficient documentation

## 2022-08-12 NOTE — Assessment & Plan Note (Signed)
This is a very pleasant 67 year old postmenopausal female patient with newly diagnosed left breast multifocal IDC, grade 2, the first specimen was ER/PR positive HER2 negative, second specimen was ER positive PR negative and HER2 negative referred to breast [facility] for recommendations.  She arrived to the appointment today by herself.  We have reviewed her images and pathology report in the breast [facility].  Although multifocal, the area between the 2 biopsies appears close.  Dr. Corliss Skains is currently planning to lumpectomies.  She is now status post lumpectomy, final pathology showed 8 mm grade 2 IDC, 6 negative sentinel lymph nodes, Oncotype DX of 16, no benefit from adjuvant chemotherapy. She will now proceed with adjuvant radiation followed by antiestrogen therapy.  We have once again discussed about mechanism of action of aromatase inhibitors, adverse effects including but not limited to postmenopausal symptoms, bone density loss.  She has osteopenia at baseline hence we will continue monitoring it. She will return to clinic in first week of June to initiate antiestrogen therapy.  Encouraged weightbearing exercises, vitamin D supplementation and calcium as tolerated

## 2022-08-12 NOTE — Progress Notes (Signed)
[facility] CONSULT NOTE  Patient Care Team: [name], DO as PCP - General (Family Medicine) Donnelly Angelica, RN as Oncology Nurse Navigator Lu Duffel, Margretta Ditty, RN as Oncology Nurse Navigator Manus Rudd, MD as Consulting Physician (General Surgery) Rachel Moulds, MD as Consulting Physician (Hematology and Oncology) Dorothy Puffer, MD as Consulting Physician (Radiation Oncology)  CHIEF COMPLAINTS/PURPOSE OF CONSULTATION:  Newly diagnosed breast cancer  HISTORY OF PRESENTING ILLNESS:  Mason Nacke 67 y.o. female is here because of recent diagnosis of left breast cancer. Breast cancer  I reviewed her records extensively and collaborated the history with the patient.  SUMMARY OF ONCOLOGIC HISTORY: Oncology History  Malignant neoplasm of upper-outer quadrant of left breast in female, estrogen receptor positive  06/11/2022 Mammogram   Bilateral diagnostic mammogram showed suspicious left breast mass at 1 o'clock position.  Ultrasound-guided biopsy was recommended.  There were also indeterminate left breast calcs recommend stereotactic biopsy.  No suspicious left axillary adenopathy.  Right breast normal   06/20/2022 Pathology Results   Pathology results showed grade 2 invasive ductal carcinoma prognostics from the first specimen at 1:00 10 cm from the nipple showed ER 100% positive strong staining PR 20% positive moderate to strong staining and Ki-67 of 10%, tumor cells negative for HER2 1+.  Second biopsy which was from upper outer quadrant coil shaped clip also showed invasive ductal carcinoma grade 2 prognostics for this 1 showed ER 95% positive strong staining PR 0% negative Ki-67 of 15% and HER2 2+ by IHC and FISH negative   06/25/2022 Initial Diagnosis   Malignant neoplasm of upper-outer quadrant of left breast in female, estrogen receptor positive (HCC)   06/26/2022 Cancer Staging   Staging form: Breast, AJCC 8th Edition - Clinical stage from 06/26/2022:  Stage IA (cT1b, cN0, cM0, G2, ER+, PR+, HER2-) - Signed by Ronny Bacon, PA-C on 06/26/2022 Stage prefix: Initial diagnosis Method of lymph node assessment: Clinical Histologic grading system: 3 grade system    Genetic Testing   [facility] Custom Panel+RNA was Negative. 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 Report date was 07/04/2022.  The Custom Hereditary Cancers Panel offered by [employer] includes sequencing and/or deletion duplication testing of the following 43 genes: APC, ATM, AXIN2, BAP1, BARD1, BMPR1A, BRCA1, BRCA2, BRIP1, CDH1, CDK4, CDKN2A (p14ARF and p16INK4a only), CHEK2, CTNNA1, EPCAM (Deletion/duplication testing only), FH, GREM1 (promoter region duplication testing only), HOXB13, KIT, MBD4, MEN1, MLH1, MSH2, MSH3, MSH6, MUTYH, NF1, NHTL1, PALB2, PDGFRA, PMS2, POLD1, POLE, PTEN, RAD51C, RAD51D, SMAD4, SMARCA4. STK11, TP53, TSC1, TSC2, and VHL.    This is a very pleasant 67 year old postmenopausal female patient with newly diagnosed left breast multifocal IDC referred to breast MDC for additional recommendations.   She had baseline has rheumatoid arthritis and takes hydroxychloroquine.  She also had gastric bypass back in 2005 and lost about 100 pounds from this.  Her mother had breast cancer at the age of 73, currently living at 61.  Her father is also living at the age of 30.  No other family history of breast cancer or any other cancers.  She has 2 living children, 1 deceased.  She is here after lumpectomy and reexcision, procedure went well.  She is healing well.  No major issues reported today.  Oncotype DX of 16, no benefit from addition of adjuvant chemotherapy.  Rest of the pertinent 10 point ROS reviewed and negative  MEDICAL HISTORY:  Past Medical History:  Diagnosis Date  Anxiety    Breast cancer    Depression    Hypertension    Rheumatoid arthritis     SURGICAL HISTORY: Past Surgical History:  Procedure  Laterality Date   BREAST BIOPSY Left 06/20/2022   [place] LT BREAST BX W LOC DEV 1ST LESION IMG BX SPEC US GUIDE 06/20/2022 [facility]   BREAST BIOPSY Left 06/20/2022   MM LT BREAST BX W LOC DEV 1ST LESION IMAGE BX SPEC STEREO GUIDE 06/20/2022 [facility]   BREAST BIOPSY  07/15/2022   MM LT RADIOACTIVE SEED LOC MAMMO GUIDE 07/15/2022 [facility]   BREAST BIOPSY  07/15/2022   MM LT RADIOACTIVE SEED EA ADD LESION LOC MAMMO GUIDE 07/15/2022 [facility]   BREAST LUMPECTOMY WITH RADIOACTIVE SEED AND SENTINEL LYMPH NODE BIOPSY Left 07/16/2022   Procedure: LEFT BREAST LUMPECTOMY WITH RADIOACTIVE SEED X2 AND AXILLARY SENTINEL LYMPH NODE BIOPSY;  Surgeon: Manus Rudd, MD;  Location: [facility] OR;  Service: General;  Laterality: Left;   GASTRIC BYPASS     HAND SURGERY Right    LASIK     RE-EXCISION OF BREAST LUMPECTOMY Left 07/24/2022   Procedure: RE-EXCISION OF LEFT BREAST LUMPECTOMY ANTERIOR AND LATERAL MARGINS;  Surgeon: Manus Rudd, MD;  Location: [facility] OR;  Service: General;  Laterality: Left;  45 MIN ROOM 12    SOCIAL HISTORY: Social History   Socioeconomic History   Marital status: Married    Spouse name: [name]   Number of children: Not on file   Years of education: Not on file   Highest education level: Not on file  Occupational History   Not on file  Tobacco Use   Smoking status: Never   Smokeless tobacco: Never  Vaping Use   Vaping Use: Never used  Substance and Sexual Activity   Alcohol use: Yes    Alcohol/week: 3.0 standard drinks of alcohol    Types: 3 Glasses of wine per week    Comment: wine 3 times per week   Drug use: Never   Sexual activity: Yes    Partners: Male  Other Topics Concern   Not on file  Social History Narrative   Not on file   Social Determinants of Health   Financial Resource Strain: Low Risk  (06/26/2022)   Overall Financial Resource Strain (CARDIA)    Difficulty of Paying Living Expenses: Not very hard  Food Insecurity: No Food  Insecurity (06/26/2022)   Hunger Vital Sign    Worried About Running Out of Food in the Last Year: Never true    Ran Out of Food in the Last Year: Never true  Transportation Needs: No Transportation Needs (06/26/2022)   PRAPARE - Administrator, Civil Service (Medical): No    Lack of Transportation (Non-Medical): No  Physical Activity: Not on file  Stress: Not on file  Social Connections: Not on file  Intimate Partner Violence: Not on file    FAMILY HISTORY: Family History  Problem Relation Age of Onset   Heart failure Mother    Breast cancer Mother 30    ALLERGIES:  is allergic to dextromethorphan and promethazine.  MEDICATIONS:  Current Outpatient Medications  Medication Sig Dispense Refill   diphenhydrAMINE (BENADRYL) 25 MG tablet Take 25-50 mg by mouth at bedtime as needed for allergies.     ergocalciferol (VITAMIN D2) 1.25 MG (50000 UT) capsule Take 50,000 Units by mouth every Saturday.     escitalopram (LEXAPRO) 20 MG tablet Take 1 tablet (20 mg total) by mouth daily. 90  tablet 2   fluticasone (FLONASE) 50 MCG/ACT nasal spray Place 1 spray into both nostrils daily as needed for allergies or rhinitis.     hydroxychloroquine (PLAQUENIL) 200 MG tablet Take 1 tablet (200 mg total) by mouth in the morning. 90 tablet 0   loperamide (IMODIUM) 2 MG capsule Take 4 mg by mouth as needed for diarrhea or loose stools.     losartan (COZAAR) 50 MG tablet TAKE 1 TABLET (50 MG TOTAL) BY MOUTH IN THE MORNING 30 tablet 1   Multiple Vitamin (MULTIVITAMIN WITH MINERALS) TABS tablet Take 2 tablets by mouth daily.     oxyCODONE (OXY IR/ROXICODONE) 5 MG immediate release tablet Take 1 tablet (5 mg total) by mouth every 6 (six) hours as needed for severe pain. 15 tablet 0   traZODone (DESYREL) 50 MG tablet Take 0.5-1 tablets (25-50 mg total) by mouth at bedtime as needed for sleep. (Patient taking differently: Take 50 mg by mouth at bedtime.) 90 tablet 2   No current facility-administered  medications for this visit.    REVIEW OF SYSTEMS:   Constitutional: Denies fevers, chills or abnormal night sweats Eyes: Denies blurriness of vision, double vision or watery eyes Ears, nose, mouth, throat, and face: Denies mucositis or sore throat Respiratory: Denies cough, dyspnea or wheezes Cardiovascular: Denies palpitation, chest discomfort or lower extremity swelling Gastrointestinal:  Denies nausea, heartburn or change in bowel habits Skin: Denies abnormal skin rashes Lymphatics: Denies new lymphadenopathy or easy bruising Neurological:Denies numbness, tingling or new weaknesses Behavioral/Psych: Mood is stable, no new changes  Breast: Denies any palpable lumps or discharge All other systems were reviewed with the patient and are negative.  PHYSICAL EXAMINATION: ECOG PERFORMANCE STATUS: 0 - Asymptomatic  Vitals:   08/12/22 1302  BP: (!) 142/86  Pulse: 74  Resp: 16  Temp: 97.9 F (36.6 C)  SpO2: 95%   Filed Weights   08/12/22 1302  Weight: 157 lb 9.6 oz (71.5 kg)    GENERAL:alert, no distress and comfortable. Breast: Left breast with postbiopsy changes.  Breast mass size cannot be estimated.  No palpable regional adenopathy. No lower extremity edema Psych: Mood and behavior normal.  LABORATORY DATA:  I have reviewed the data as listed Lab Results  Component Value Date   WBC 6.6 06/26/2022   HGB 12.8 06/26/2022   HCT 39.6 06/26/2022   MCV 86.7 06/26/2022   PLT 320 06/26/2022   Lab Results  Component Value Date   NA 141 06/26/2022   K 4.1 06/26/2022   CL 107 06/26/2022   CO2 29 06/26/2022    RADIOGRAPHIC STUDIES: I have personally reviewed the radiological reports and agreed with the findings in the report.  ASSESSMENT AND PLAN:   Malignant neoplasm of upper-outer quadrant of left breast in female, estrogen receptor positive (HCC) This is a very pleasant 67 year old postmenopausal female patient with newly diagnosed left breast multifocal IDC, grade  2, the first specimen was ER/PR positive HER2 negative, second specimen was ER positive PR negative and HER2 negative referred to breast MDC for recommendations.  She arrived to the appointment today by herself.  We have reviewed her images and pathology report in the breast MDC.  Although multifocal, the area between the 2 biopsies appears close.  Dr. Corliss Skains is currently planning to lumpectomies.  She is now status post lumpectomy, final pathology showed 8 mm grade 2 IDC, 6 negative sentinel lymph nodes, Oncotype DX of 16, no benefit from adjuvant chemotherapy. She will now proceed with adjuvant radiation  followed by antiestrogen therapy.  We have once again discussed about mechanism of action of aromatase inhibitors, adverse effects including but not limited to postmenopausal symptoms, bone density loss.  She has osteopenia at baseline hence we will continue monitoring it. She will return to clinic in first week of June to initiate antiestrogen therapy.  Encouraged weightbearing exercises, vitamin D supplementation and calcium as tolerated  Total time spent: 30 min including History, physical exam, review of records, counseling and coordination of care. All questions were answered. The patient knows to call the clinic with any problems, questions or concerns.    Rachel Moulds, MD 08/12/22

## 2022-08-14 ENCOUNTER — Encounter: Payer: Self-pay | Admitting: *Deleted

## 2022-08-14 ENCOUNTER — Telehealth: Payer: Self-pay | Admitting: Family Medicine

## 2022-08-14 DIAGNOSIS — H25812 Combined forms of age-related cataract, left eye: Secondary | ICD-10-CM | POA: Diagnosis not present

## 2022-08-14 DIAGNOSIS — H268 Other specified cataract: Secondary | ICD-10-CM | POA: Diagnosis not present

## 2022-08-14 NOTE — Telephone Encounter (Signed)
Contacted Linda Wood to schedule their annual wellness visit. Appointment made for 09/02/2022.  Verlee Rossetti; Care Guide Ambulatory Clinical Support [place] [facility] Direct Dial: [phone]

## 2022-08-16 ENCOUNTER — Telehealth: Payer: Self-pay | Admitting: Radiation Oncology

## 2022-08-16 NOTE — Telephone Encounter (Signed)
Returned patient's VM regarding rescheduling CT sim. No answer, LVM for a return call

## 2022-08-19 NOTE — Progress Notes (Signed)
Radiation Oncology         ([phone]) [phone] ________________________________  Name: Linda Wood        MRN: [id number]  Date of Service: 08/21/2022 DOB: 08/10/1955  WU:[id number], Jilda Roche, DO  Rachel Moulds, MD     REFERRING PHYSICIAN: Rachel Moulds, MD   DIAGNOSIS: The primary encounter diagnosis was Malignant neoplasm of upper-outer quadrant of left breast in female, estrogen receptor positive. A diagnosis of Rheumatoid arthritis of other site, unspecified whether rheumatoid factor present was also pertinent to this visit.   HISTORY OF PRESENT ILLNESS: Abisha Howatt is a 67 y.o. female originally seen in the multidisciplinary breast clinic for a new diagnosis of left breast cancer. The patient was noted to have calcifications and mass in previous mammogram and returned for delayed follow up. Imaging showed a mass and calcifications in the upper outer quadranat of the left breast. By ultrasound the mass was in the 1:00 position measuring 7 mm with no adenoapthy. The other area of calcifications was biopsied as well under stereotactic mammogram imaging. 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   Since her last visit, the patient has undergone a left lumpectomy with sentinel lymph node biopsy on 07/16/2022.  Final results showed a grade 2 invasive ductal carcinoma measuring 8 mm with intermediate grade DCIS.  She had positive anterior and lateral margins for invasive disease and her anterior margin was 1 mm for DCIS.  2 sentinel lymph nodes were negative for disease. In addition she had a 2 mm grade 2 invasive ductal carcinoma with associated intermediate grade DCIS in the second specimen.  Her margins for this site were negative.  Her Oncotype Dx score was 16 and no chemotherapy is recommended based on this score.   She returned for reexcision of her margins with Dr. Corliss Skains on 07/24/2022 both showed benign breast parenchyma and her lateral and anterior margin are now fully negative.  She is seen to discuss adjuvant radiotherapy.    PREVIOUS RADIATION THERAPY: No   PAST MEDICAL HISTORY:  Past Medical History:  Diagnosis Date   Anxiety    Breast cancer    Depression    Hypertension    Rheumatoid arthritis        PAST SURGICAL HISTORY: Past Surgical History:  Procedure Laterality Date   BREAST BIOPSY Left 06/20/2022   [place] LT BREAST BX W LOC DEV 1ST LESION IMG BX SPEC US GUIDE 06/20/2022 [facility]   BREAST BIOPSY Left 06/20/2022   MM LT BREAST BX W LOC DEV 1ST LESION IMAGE BX SPEC STEREO GUIDE 06/20/2022 [facility]   BREAST BIOPSY  07/15/2022   MM LT RADIOACTIVE SEED LOC MAMMO GUIDE 07/15/2022 [facility]   BREAST BIOPSY  07/15/2022   MM LT RADIOACTIVE SEED EA ADD LESION LOC MAMMO GUIDE 07/15/2022 [facility]   BREAST LUMPECTOMY WITH RADIOACTIVE SEED AND SENTINEL LYMPH NODE BIOPSY Left 07/16/2022   Procedure: LEFT BREAST LUMPECTOMY WITH RADIOACTIVE SEED X2 AND AXILLARY SENTINEL LYMPH NODE BIOPSY;  Surgeon: Manus Rudd, MD;  Location: [facility];  Service: General;  Laterality: Left;   GASTRIC BYPASS     HAND SURGERY Right    LASIK     RE-EXCISION OF BREAST LUMPECTOMY Left 07/24/2022   Procedure: RE-EXCISION OF LEFT BREAST LUMPECTOMY ANTERIOR AND LATERAL MARGINS;  Surgeon: Manus Rudd, MD;  Location: [facility] OR;  Service: General;  Laterality: Left;  45 MIN ROOM 12     FAMILY HISTORY:  Family History  Problem Relation Age of Onset   Heart failure Mother    Breast cancer Mother 33     SOCIAL HISTORY:  reports that she has never smoked. She has never used smokeless tobacco. She reports current alcohol use of about 3.0 standard drinks of alcohol per week. She reports that she does not use drugs. She is married and lives in [place]. She is retired from working as a  Technical brewer in an Therapist, music. She and her husband relocated within the last year from [place], [place].   She also has a 61 month old granddaughter, Scout who she enjoys seeing. She also enjoys needlepoint, and reading.     ALLERGIES: Dextromethorphan and Promethazine   MEDICATIONS:  Current Outpatient Medications  Medication Sig Dispense Refill   diphenhydrAMINE (BENADRYL) 25 MG tablet Take 25-50 mg by mouth at bedtime as needed for allergies.     ergocalciferol (VITAMIN D2) 1.25 MG (50000 UT) capsule Take 50,000 Units by mouth every Saturday.     escitalopram (LEXAPRO) 20 MG tablet Take 1 tablet (20 mg total) by mouth daily. 90 tablet 2   fluticasone (FLONASE) 50 MCG/ACT nasal spray Place 1 spray into both nostrils daily as needed for allergies or rhinitis.     hydroxychloroquine (PLAQUENIL) 200 MG tablet Take 1 tablet (200 mg total) by mouth in the morning. 90 tablet 0   loperamide (IMODIUM) 2 MG capsule Take 4 mg by mouth as needed for diarrhea or loose stools.     losartan (COZAAR) 50 MG tablet TAKE 1 TABLET (50 MG TOTAL) BY MOUTH IN THE MORNING 30 tablet 1   Multiple Vitamin (MULTIVITAMIN WITH MINERALS) TABS tablet Take 2 tablets by mouth daily.     oxyCODONE (OXY IR/ROXICODONE) 5 MG immediate release tablet Take 1 tablet (5 mg total) by mouth every 6 (six) hours as needed for severe pain. 15 tablet 0   traZODone (DESYREL) 50 MG tablet Take 0.5-1 tablets (25-50 mg total) by mouth at bedtime as needed for sleep. (Patient taking differently: Take 50 mg by mouth at bedtime.) 90 tablet 2   No current facility-administered medications for this encounter.     REVIEW OF SYSTEMS: On review of systems, the patient reports that she is doing well since her second surgery. No other complaints are verbalized.      PHYSICAL EXAM:  Wt Readings from Last 3 Encounters:  08/21/22 153 lb (69.4 kg)  08/12/22 157 lb 9.6 oz (71.5 kg)  07/24/22 152 lb (68.9 kg)   Temp Readings from Last 3  Encounters:  08/21/22 97.8 F (36.6 C) (Oral)  08/12/22 97.9 F (36.6 C) (Temporal)  07/24/22 97.7 F (36.5 C)   BP Readings from Last 3 Encounters:  08/21/22 127/85  08/12/22 (!) 142/86  07/24/22 (!) 142/77   Pulse Readings from Last 3 Encounters:  08/21/22 70  08/12/22 74  07/24/22 67    In general this is a well appearing caucasian female in no acute distress. She's alert and oriented x4 and appropriate throughout the examination. Cardiopulmonary assessment is negative for acute distress and she exhibits normal effort.  Her left breast incision site is well-healed without erythema, separation, or drainage.    ECOG = 0  0 - Asymptomatic (Fully active, able to carry on all predisease activities without restriction)  1 - Symptomatic but completely ambulatory (Restricted in physically strenuous activity  but ambulatory and able to carry out work of a light or sedentary nature. For example, light housework, office work)  2 - Symptomatic, <50% in bed during the day (Ambulatory and capable of all self care but unable to carry out any work activities. Up and about more than 50% of waking hours)  3 - Symptomatic, >50% in bed, but not bedbound (Capable of only limited self-care, confined to bed or chair 50% or more of waking hours)  4 - Bedbound (Completely disabled. Cannot carry on any self-care. Totally confined to bed or chair)  5 - Death   Santiago Glad MM, Creech RH, Tormey DC, et al. [phone]). "Toxicity and response criteria of the [facility]". Am. Evlyn Clines. Oncol. 5 (6): 649-55    LABORATORY DATA:  Lab Results  Component Value Date   WBC 6.6 06/26/2022   HGB 12.8 06/26/2022   HCT 39.6 06/26/2022   MCV 86.7 06/26/2022   PLT 320 06/26/2022   Lab Results  Component Value Date   NA 141 06/26/2022   K 4.1 06/26/2022   CL 107 06/26/2022   CO2 29 06/26/2022   Lab Results  Component Value Date   ALT 10 06/26/2022   AST 13 (L) 06/26/2022   ALKPHOS 73  06/26/2022   BILITOT 0.3 06/26/2022      RADIOGRAPHY: No results found.     IMPRESSION/PLAN: 1. Stage IA, pT1bN0M0, grade 2 ER/PR positive invasive ductal carcinoma of the left breast with synchronous Stage IA, pT1aN0M0, grade 2 ER positive PR negative invasive ductal carcinoma of the left breast.  Dr. Mitzi Hansen has reviewed the final pathology results and she and I reviewed the nature of left breast disease.  She has done well since surgery and does not need chemotherapy based on her Oncotype DX score.  Dr. Mitzi Hansen would recommend external radiotherapy to the breast  to reduce risks of local recurrence followed by antiestrogen therapy. We discussed the risks, benefits, short, and long term effects of radiotherapy, as well as the curative intent, and the patient is interested in proceeding.  We reviewed the delivery and logistics of radiotherapy and Dr. Mitzi Hansen recommends 4 weeks of radiotherapy to the left breast with deep inspiration breath hold technique. Written consent is obtained and placed in the chart, a copy was provided to the patient.  She will simulate on Friday.  2. Rheumatoid Arthritis. The patient sees Dr. Corliss Skains and takes plaquenil.  She is aware of the need to avoid methotrexate if her regimen were to switch  In a visit lasting 45 minutes, greater than 50% of the time was spent face to face reviewing her case, as well as in preparation of, discussing, and coordinating the patient's care.    Osker Mason, [facility]    **Disclaimer: This note was dictated with voice recognition software. Similar sounding words can inadvertently be transcribed and this note may contain transcription errors which may not haOnve been corrected upon publication of note.**

## 2022-08-21 ENCOUNTER — Encounter: Payer: Self-pay | Admitting: Radiation Oncology

## 2022-08-21 ENCOUNTER — Ambulatory Visit
Admission: RE | Admit: 2022-08-21 | Discharge: 2022-08-21 | Disposition: A | Discharge status: Home / Self Care | Payer: Medicare HMO | Source: Ambulatory Visit | Attending: Radiation Oncology | Admitting: Radiation Oncology

## 2022-08-21 VITALS — BP 127/85 | HR 70 | Temp 97.8°F | Resp 18 | Ht 62.0 in | Wt 153.0 lb

## 2022-08-21 DIAGNOSIS — C50412 Malignant neoplasm of upper-outer quadrant of left female breast: Secondary | ICD-10-CM

## 2022-08-21 DIAGNOSIS — Z79899 Other long term (current) drug therapy: Secondary | ICD-10-CM | POA: Insufficient documentation

## 2022-08-21 DIAGNOSIS — I10 Essential (primary) hypertension: Secondary | ICD-10-CM | POA: Diagnosis not present

## 2022-08-21 DIAGNOSIS — Z170 Estrogen receptor positive status [ER+]: Secondary | ICD-10-CM | POA: Diagnosis not present

## 2022-08-21 DIAGNOSIS — M069 Rheumatoid arthritis, unspecified: Secondary | ICD-10-CM | POA: Insufficient documentation

## 2022-08-21 DIAGNOSIS — H25811 Combined forms of age-related cataract, right eye: Secondary | ICD-10-CM | POA: Diagnosis not present

## 2022-08-21 NOTE — Progress Notes (Signed)
Nursing interview for Malignant neoplasm of upper-outer quadrant of left breast in female, estrogen receptor positive. A diagnosis of Rheumatoid arthritis of other site, unspecified whether rheumatoid factor present was also pertinent to this visit.  Patient identity verified x2.  Patient reports NO LT breast discomfort at this time.  Meaningful use complete. Postmenopausal  Vitals- BP 127/85 (BP Location: Right Arm, Patient Position: Sitting, Cuff Size: Normal)   Pulse 70   Temp 97.8 F (36.6 C) (Oral)   Resp 18   Ht  (1.575 m)   Wt 153 lb (69.4 kg)   SpO2 100%   BMI 27.98 kg/m   This concludes the interview.   Ruel Favors, LPN

## 2022-08-22 DIAGNOSIS — Z170 Estrogen receptor positive status [ER+]: Secondary | ICD-10-CM | POA: Diagnosis not present

## 2022-08-22 DIAGNOSIS — C50412 Malignant neoplasm of upper-outer quadrant of left female breast: Secondary | ICD-10-CM | POA: Diagnosis not present

## 2022-08-23 ENCOUNTER — Ambulatory Visit
Admission: RE | Admit: 2022-08-23 | Discharge: 2022-08-23 | Disposition: A | Discharge status: Home / Self Care | Payer: Medicare HMO | Source: Ambulatory Visit | Attending: Radiation Oncology | Admitting: Radiation Oncology

## 2022-08-23 DIAGNOSIS — Z170 Estrogen receptor positive status [ER+]: Secondary | ICD-10-CM | POA: Insufficient documentation

## 2022-08-23 DIAGNOSIS — C50412 Malignant neoplasm of upper-outer quadrant of left female breast: Secondary | ICD-10-CM | POA: Insufficient documentation

## 2022-08-23 DIAGNOSIS — Z510 Encounter for antineoplastic radiation therapy: Secondary | ICD-10-CM | POA: Diagnosis not present

## 2022-08-28 DIAGNOSIS — H268 Other specified cataract: Secondary | ICD-10-CM | POA: Diagnosis not present

## 2022-08-28 DIAGNOSIS — H25811 Combined forms of age-related cataract, right eye: Secondary | ICD-10-CM | POA: Diagnosis not present

## 2022-09-02 ENCOUNTER — Ambulatory Visit: Payer: Medicare HMO

## 2022-09-05 DIAGNOSIS — Z170 Estrogen receptor positive status [ER+]: Secondary | ICD-10-CM | POA: Diagnosis not present

## 2022-09-05 DIAGNOSIS — Z510 Encounter for antineoplastic radiation therapy: Secondary | ICD-10-CM | POA: Diagnosis not present

## 2022-09-05 DIAGNOSIS — C50412 Malignant neoplasm of upper-outer quadrant of left female breast: Secondary | ICD-10-CM | POA: Insufficient documentation

## 2022-09-09 ENCOUNTER — Other Ambulatory Visit: Payer: Self-pay

## 2022-09-09 ENCOUNTER — Ambulatory Visit
Admission: RE | Admit: 2022-09-09 | Discharge: 2022-09-09 | Disposition: A | Discharge status: Home / Self Care | Payer: Medicare HMO | Source: Ambulatory Visit | Attending: Radiation Oncology | Admitting: Radiation Oncology

## 2022-09-09 DIAGNOSIS — Z170 Estrogen receptor positive status [ER+]: Secondary | ICD-10-CM | POA: Diagnosis not present

## 2022-09-09 DIAGNOSIS — Z510 Encounter for antineoplastic radiation therapy: Secondary | ICD-10-CM | POA: Diagnosis not present

## 2022-09-09 DIAGNOSIS — C50412 Malignant neoplasm of upper-outer quadrant of left female breast: Secondary | ICD-10-CM | POA: Diagnosis not present

## 2022-09-09 LAB — RAD ONC ARIA SESSION SUMMARY
Course Elapsed Days: 0
Plan Fractions Treated to Date: 1
Plan Prescribed Dose Per Fraction: 2.66 Gy
Plan Total Fractions Prescribed: 16
Plan Total Prescribed Dose: 42.56 Gy
Reference Point Dosage Given to Date: 2.66 Gy
Reference Point Session Dosage Given: 2.66 Gy
Session Number: 1

## 2022-09-10 ENCOUNTER — Other Ambulatory Visit: Payer: Self-pay

## 2022-09-10 ENCOUNTER — Ambulatory Visit
Admission: RE | Admit: 2022-09-10 | Discharge: 2022-09-10 | Disposition: A | Discharge status: Home / Self Care | Payer: Medicare HMO | Source: Ambulatory Visit | Attending: Radiation Oncology | Admitting: Radiation Oncology

## 2022-09-10 DIAGNOSIS — Z170 Estrogen receptor positive status [ER+]: Secondary | ICD-10-CM | POA: Diagnosis not present

## 2022-09-10 DIAGNOSIS — Z510 Encounter for antineoplastic radiation therapy: Secondary | ICD-10-CM | POA: Diagnosis not present

## 2022-09-10 DIAGNOSIS — C50412 Malignant neoplasm of upper-outer quadrant of left female breast: Secondary | ICD-10-CM | POA: Diagnosis not present

## 2022-09-10 LAB — RAD ONC ARIA SESSION SUMMARY
Course Elapsed Days: 1
Plan Fractions Treated to Date: 2
Plan Prescribed Dose Per Fraction: 2.66 Gy
Plan Total Fractions Prescribed: 16
Plan Total Prescribed Dose: 42.56 Gy
Reference Point Dosage Given to Date: 5.32 Gy
Reference Point Session Dosage Given: 2.66 Gy
Session Number: 2

## 2022-09-11 ENCOUNTER — Ambulatory Visit
Admission: RE | Admit: 2022-09-11 | Discharge: 2022-09-11 | Disposition: A | Discharge status: Home / Self Care | Payer: Medicare HMO | Source: Ambulatory Visit | Attending: Radiation Oncology | Admitting: Radiation Oncology

## 2022-09-11 ENCOUNTER — Other Ambulatory Visit: Payer: Self-pay

## 2022-09-11 DIAGNOSIS — Z170 Estrogen receptor positive status [ER+]: Secondary | ICD-10-CM | POA: Diagnosis not present

## 2022-09-11 DIAGNOSIS — C50412 Malignant neoplasm of upper-outer quadrant of left female breast: Secondary | ICD-10-CM | POA: Diagnosis not present

## 2022-09-11 DIAGNOSIS — Z510 Encounter for antineoplastic radiation therapy: Secondary | ICD-10-CM | POA: Diagnosis not present

## 2022-09-11 LAB — RAD ONC ARIA SESSION SUMMARY
Course Elapsed Days: 2
Plan Fractions Treated to Date: 3
Plan Prescribed Dose Per Fraction: 2.66 Gy
Plan Total Fractions Prescribed: 16
Plan Total Prescribed Dose: 42.56 Gy
Reference Point Dosage Given to Date: 7.98 Gy
Reference Point Session Dosage Given: 2.66 Gy
Session Number: 3

## 2022-09-12 ENCOUNTER — Other Ambulatory Visit: Payer: Self-pay

## 2022-09-12 ENCOUNTER — Ambulatory Visit
Admission: RE | Admit: 2022-09-12 | Discharge: 2022-09-12 | Disposition: A | Discharge status: Home / Self Care | Payer: Medicare HMO | Source: Ambulatory Visit | Attending: Radiation Oncology | Admitting: Radiation Oncology

## 2022-09-12 DIAGNOSIS — Z170 Estrogen receptor positive status [ER+]: Secondary | ICD-10-CM | POA: Diagnosis not present

## 2022-09-12 DIAGNOSIS — C50412 Malignant neoplasm of upper-outer quadrant of left female breast: Secondary | ICD-10-CM | POA: Diagnosis not present

## 2022-09-12 DIAGNOSIS — Z510 Encounter for antineoplastic radiation therapy: Secondary | ICD-10-CM | POA: Diagnosis not present

## 2022-09-12 LAB — RAD ONC ARIA SESSION SUMMARY
Course Elapsed Days: 3
Plan Fractions Treated to Date: 4
Plan Prescribed Dose Per Fraction: 2.66 Gy
Plan Total Fractions Prescribed: 16
Plan Total Prescribed Dose: 42.56 Gy
Reference Point Dosage Given to Date: 10.64 Gy
Reference Point Session Dosage Given: 2.66 Gy
Session Number: 4

## 2022-09-13 ENCOUNTER — Ambulatory Visit
Admission: RE | Admit: 2022-09-13 | Discharge: 2022-09-13 | Disposition: A | Discharge status: Home / Self Care | Payer: Medicare HMO | Source: Ambulatory Visit | Attending: Radiation Oncology | Admitting: Radiation Oncology

## 2022-09-13 ENCOUNTER — Other Ambulatory Visit: Payer: Self-pay

## 2022-09-13 DIAGNOSIS — C50412 Malignant neoplasm of upper-outer quadrant of left female breast: Secondary | ICD-10-CM

## 2022-09-13 DIAGNOSIS — Z510 Encounter for antineoplastic radiation therapy: Secondary | ICD-10-CM | POA: Diagnosis not present

## 2022-09-13 DIAGNOSIS — Z170 Estrogen receptor positive status [ER+]: Secondary | ICD-10-CM | POA: Diagnosis not present

## 2022-09-13 LAB — RAD ONC ARIA SESSION SUMMARY
Course Elapsed Days: 4
Plan Fractions Treated to Date: 5
Plan Prescribed Dose Per Fraction: 2.66 Gy
Plan Total Fractions Prescribed: 16
Plan Total Prescribed Dose: 42.56 Gy
Reference Point Dosage Given to Date: 13.3 Gy
Reference Point Session Dosage Given: 2.66 Gy
Session Number: 5

## 2022-09-13 MED ORDER — RADIAPLEXRX EX GEL: Freq: Once | CUTANEOUS | Status: AC

## 2022-09-13 MED ORDER — ALRA NON-METALLIC DEODORANT (RAD-ONC)
1.0000 | Freq: Once | TOPICAL | Status: AC
  Administered 2022-09-13: 1 via TOPICAL

## 2022-09-16 ENCOUNTER — Other Ambulatory Visit: Payer: Self-pay

## 2022-09-16 ENCOUNTER — Ambulatory Visit
Admission: RE | Admit: 2022-09-16 | Discharge: 2022-09-16 | Disposition: A | Discharge status: Home / Self Care | Payer: Medicare HMO | Source: Ambulatory Visit | Attending: Radiation Oncology | Admitting: Radiation Oncology

## 2022-09-16 DIAGNOSIS — C50412 Malignant neoplasm of upper-outer quadrant of left female breast: Secondary | ICD-10-CM | POA: Diagnosis not present

## 2022-09-16 DIAGNOSIS — Z510 Encounter for antineoplastic radiation therapy: Secondary | ICD-10-CM | POA: Diagnosis not present

## 2022-09-16 DIAGNOSIS — Z170 Estrogen receptor positive status [ER+]: Secondary | ICD-10-CM | POA: Diagnosis not present

## 2022-09-16 LAB — RAD ONC ARIA SESSION SUMMARY
Course Elapsed Days: 7
Plan Fractions Treated to Date: 6
Plan Prescribed Dose Per Fraction: 2.66 Gy
Plan Total Fractions Prescribed: 16
Plan Total Prescribed Dose: 42.56 Gy
Reference Point Dosage Given to Date: 15.96 Gy
Reference Point Session Dosage Given: 2.66 Gy
Session Number: 6

## 2022-09-17 ENCOUNTER — Other Ambulatory Visit: Payer: Self-pay

## 2022-09-17 ENCOUNTER — Ambulatory Visit
Admission: RE | Admit: 2022-09-17 | Discharge: 2022-09-17 | Disposition: A | Discharge status: Home / Self Care | Payer: Medicare HMO | Source: Ambulatory Visit | Attending: Radiation Oncology | Admitting: Radiation Oncology

## 2022-09-17 DIAGNOSIS — C50412 Malignant neoplasm of upper-outer quadrant of left female breast: Secondary | ICD-10-CM | POA: Diagnosis not present

## 2022-09-17 DIAGNOSIS — Z510 Encounter for antineoplastic radiation therapy: Secondary | ICD-10-CM | POA: Diagnosis not present

## 2022-09-17 DIAGNOSIS — Z170 Estrogen receptor positive status [ER+]: Secondary | ICD-10-CM | POA: Diagnosis not present

## 2022-09-17 LAB — RAD ONC ARIA SESSION SUMMARY
Course Elapsed Days: 8
Plan Fractions Treated to Date: 7
Plan Prescribed Dose Per Fraction: 2.66 Gy
Plan Total Fractions Prescribed: 16
Plan Total Prescribed Dose: 42.56 Gy
Reference Point Dosage Given to Date: 18.62 Gy
Reference Point Session Dosage Given: 2.66 Gy
Session Number: 7

## 2022-09-18 ENCOUNTER — Other Ambulatory Visit: Payer: Self-pay

## 2022-09-18 ENCOUNTER — Ambulatory Visit
Admission: RE | Admit: 2022-09-18 | Discharge: 2022-09-18 | Disposition: A | Discharge status: Home / Self Care | Payer: Medicare HMO | Source: Ambulatory Visit | Attending: Radiation Oncology | Admitting: Radiation Oncology

## 2022-09-18 DIAGNOSIS — Z510 Encounter for antineoplastic radiation therapy: Secondary | ICD-10-CM | POA: Diagnosis not present

## 2022-09-18 DIAGNOSIS — C50412 Malignant neoplasm of upper-outer quadrant of left female breast: Secondary | ICD-10-CM | POA: Diagnosis not present

## 2022-09-18 DIAGNOSIS — Z170 Estrogen receptor positive status [ER+]: Secondary | ICD-10-CM | POA: Diagnosis not present

## 2022-09-18 LAB — RAD ONC ARIA SESSION SUMMARY
Course Elapsed Days: 9
Plan Fractions Treated to Date: 8
Plan Prescribed Dose Per Fraction: 2.66 Gy
Plan Total Fractions Prescribed: 16
Plan Total Prescribed Dose: 42.56 Gy
Reference Point Dosage Given to Date: 21.28 Gy
Reference Point Session Dosage Given: 2.66 Gy
Session Number: 8

## 2022-09-19 ENCOUNTER — Ambulatory Visit
Admission: RE | Admit: 2022-09-19 | Discharge: 2022-09-19 | Disposition: A | Discharge status: Home / Self Care | Payer: Medicare HMO | Source: Ambulatory Visit | Attending: Radiation Oncology | Admitting: Radiation Oncology

## 2022-09-19 ENCOUNTER — Encounter: Payer: Self-pay | Admitting: *Deleted

## 2022-09-19 ENCOUNTER — Other Ambulatory Visit: Payer: Self-pay

## 2022-09-19 DIAGNOSIS — Z170 Estrogen receptor positive status [ER+]: Secondary | ICD-10-CM | POA: Diagnosis not present

## 2022-09-19 DIAGNOSIS — Z510 Encounter for antineoplastic radiation therapy: Secondary | ICD-10-CM | POA: Diagnosis not present

## 2022-09-19 DIAGNOSIS — C50412 Malignant neoplasm of upper-outer quadrant of left female breast: Secondary | ICD-10-CM | POA: Diagnosis not present

## 2022-09-19 LAB — RAD ONC ARIA SESSION SUMMARY
Course Elapsed Days: 10
Plan Fractions Treated to Date: 9
Plan Prescribed Dose Per Fraction: 2.66 Gy
Plan Total Fractions Prescribed: 16
Plan Total Prescribed Dose: 42.56 Gy
Reference Point Dosage Given to Date: 23.94 Gy
Reference Point Session Dosage Given: 2.66 Gy
Session Number: 9

## 2022-09-20 ENCOUNTER — Ambulatory Visit
Admission: RE | Admit: 2022-09-20 | Discharge: 2022-09-20 | Disposition: A | Discharge status: Home / Self Care | Payer: Medicare HMO | Source: Ambulatory Visit | Attending: Radiation Oncology | Admitting: Radiation Oncology

## 2022-09-20 ENCOUNTER — Other Ambulatory Visit: Payer: Self-pay

## 2022-09-20 DIAGNOSIS — Z170 Estrogen receptor positive status [ER+]: Secondary | ICD-10-CM | POA: Diagnosis not present

## 2022-09-20 DIAGNOSIS — C50412 Malignant neoplasm of upper-outer quadrant of left female breast: Secondary | ICD-10-CM | POA: Diagnosis not present

## 2022-09-20 DIAGNOSIS — Z510 Encounter for antineoplastic radiation therapy: Secondary | ICD-10-CM | POA: Diagnosis not present

## 2022-09-20 LAB — RAD ONC ARIA SESSION SUMMARY
Course Elapsed Days: 11
Plan Fractions Treated to Date: 10
Plan Prescribed Dose Per Fraction: 2.66 Gy
Plan Total Fractions Prescribed: 16
Plan Total Prescribed Dose: 42.56 Gy
Reference Point Dosage Given to Date: 26.6 Gy
Reference Point Session Dosage Given: 2.66 Gy
Session Number: 10

## 2022-09-24 ENCOUNTER — Ambulatory Visit
Admission: RE | Admit: 2022-09-24 | Discharge: 2022-09-24 | Disposition: A | Discharge status: Home / Self Care | Payer: Medicare HMO | Source: Ambulatory Visit | Attending: Radiation Oncology | Admitting: Radiation Oncology

## 2022-09-24 ENCOUNTER — Other Ambulatory Visit: Payer: Self-pay

## 2022-09-24 DIAGNOSIS — Z510 Encounter for antineoplastic radiation therapy: Secondary | ICD-10-CM | POA: Diagnosis not present

## 2022-09-24 DIAGNOSIS — C50412 Malignant neoplasm of upper-outer quadrant of left female breast: Secondary | ICD-10-CM | POA: Diagnosis not present

## 2022-09-24 DIAGNOSIS — Z170 Estrogen receptor positive status [ER+]: Secondary | ICD-10-CM | POA: Diagnosis not present

## 2022-09-24 LAB — RAD ONC ARIA SESSION SUMMARY
Course Elapsed Days: 15
Plan Fractions Treated to Date: 11
Plan Prescribed Dose Per Fraction: 2.66 Gy
Plan Total Fractions Prescribed: 16
Plan Total Prescribed Dose: 42.56 Gy
Reference Point Dosage Given to Date: 29.26 Gy
Reference Point Session Dosage Given: 2.66 Gy
Session Number: 11

## 2022-09-25 ENCOUNTER — Other Ambulatory Visit: Payer: Self-pay

## 2022-09-25 ENCOUNTER — Ambulatory Visit
Admission: RE | Admit: 2022-09-25 | Discharge: 2022-09-25 | Disposition: A | Discharge status: Home / Self Care | Payer: Medicare HMO | Source: Ambulatory Visit | Attending: Radiation Oncology | Admitting: Radiation Oncology

## 2022-09-25 DIAGNOSIS — Z170 Estrogen receptor positive status [ER+]: Secondary | ICD-10-CM | POA: Diagnosis not present

## 2022-09-25 DIAGNOSIS — Z510 Encounter for antineoplastic radiation therapy: Secondary | ICD-10-CM | POA: Diagnosis not present

## 2022-09-25 DIAGNOSIS — C50412 Malignant neoplasm of upper-outer quadrant of left female breast: Secondary | ICD-10-CM | POA: Diagnosis not present

## 2022-09-25 LAB — RAD ONC ARIA SESSION SUMMARY
Course Elapsed Days: 16
Plan Fractions Treated to Date: 12
Plan Prescribed Dose Per Fraction: 2.66 Gy
Plan Total Fractions Prescribed: 16
Plan Total Prescribed Dose: 42.56 Gy
Reference Point Dosage Given to Date: 31.92 Gy
Reference Point Session Dosage Given: 2.66 Gy
Session Number: 12

## 2022-09-26 ENCOUNTER — Other Ambulatory Visit: Payer: Self-pay

## 2022-09-26 ENCOUNTER — Ambulatory Visit
Admission: RE | Admit: 2022-09-26 | Discharge: 2022-09-26 | Disposition: A | Discharge status: Home / Self Care | Payer: Medicare HMO | Source: Ambulatory Visit | Attending: Radiation Oncology | Admitting: Radiation Oncology

## 2022-09-26 DIAGNOSIS — Z170 Estrogen receptor positive status [ER+]: Secondary | ICD-10-CM | POA: Diagnosis not present

## 2022-09-26 DIAGNOSIS — C50412 Malignant neoplasm of upper-outer quadrant of left female breast: Secondary | ICD-10-CM | POA: Diagnosis not present

## 2022-09-26 DIAGNOSIS — Z510 Encounter for antineoplastic radiation therapy: Secondary | ICD-10-CM | POA: Diagnosis not present

## 2022-09-26 LAB — RAD ONC ARIA SESSION SUMMARY
Course Elapsed Days: 17
Plan Fractions Treated to Date: 13
Plan Prescribed Dose Per Fraction: 2.66 Gy
Plan Total Fractions Prescribed: 16
Plan Total Prescribed Dose: 42.56 Gy
Reference Point Dosage Given to Date: 34.58 Gy
Reference Point Session Dosage Given: 2.66 Gy
Session Number: 13

## 2022-09-26 NOTE — Progress Notes (Signed)
Office Visit Note  Patient: Linda Wood             Date of Birth: 05-May-1955           MRN: [id number]             PCP: [name], DO Referring: [name]* Visit Date: 10/08/2022 Occupation: @GUAROCC @  Subjective:  Medication management  History of Present Illness: Linda Wood is a 67 y.o. female with positive rheumatoid arthritis and osteoarthritis.  She returns today after her last visit on June 04, 2022.  She continues to take hydroxychloroquine 200 mg p.o. daily.  She has not had a flare of rheumatoid arthritis although she continues to have some stiffness in her hands.  Knee joint and lower back pain is manageable.  She was diagnosed with breast cancer in February 2024.  She has been under care of Dr. Al Pimple.  She was treated with lumpectomy and radiation therapy.  She also had left and right eye cataract surgery in April 17 and May 1 respectively.    Activities of Daily Living:  Patient reports morning stiffness for 45 minutes.   Patient Denies nocturnal pain.  Difficulty dressing/grooming: Denies Difficulty climbing stairs: Denies Difficulty getting out of chair: Denies Difficulty using hands for taps, buttons, cutlery, and/or writing: Reports  Review of Systems  Constitutional:  Positive for fatigue.  HENT:  Negative for mouth sores and mouth dryness.   Eyes:  Negative for dryness.  Respiratory:  Negative for shortness of breath.   Cardiovascular:  Negative for chest pain and palpitations.  Gastrointestinal:  Negative for blood in stool, constipation and diarrhea.  Endocrine: Negative for increased urination.  Genitourinary:  Negative for involuntary urination.  Musculoskeletal:  Positive for joint pain, joint pain and morning stiffness. Negative for gait problem, joint swelling, myalgias, muscle weakness, muscle tenderness and myalgias.  Skin:  Positive for sensitivity to sunlight. Negative for color change and hair loss.   Allergic/Immunologic: Negative for susceptible to infections.  Neurological:  Negative for dizziness and headaches.  Hematological:  Negative for swollen glands.  Psychiatric/Behavioral:  Negative for depressed mood and sleep disturbance. The patient is nervous/anxious.     PMFS History:  Patient Active Problem List   Diagnosis Date Noted   Genetic testing 07/05/2022   Family history of breast cancer 06/27/2022   Malignant neoplasm of upper-outer quadrant of left breast in female, estrogen receptor positive (HCC) 06/25/2022   Psychophysiological insomnia 11/28/2021   Rheumatoid arthritis (HCC) 10/29/2021   Essential hypertension 10/29/2021   Depression, major, single episode, complete remission (HCC) 10/29/2021   Rib fractures 10/15/2021    Past Medical History:  Diagnosis Date   Anxiety    Breast cancer (HCC)    Depression    Hypertension    Rheumatoid arthritis (HCC)     Family History  Problem Relation Age of Onset   Heart failure Mother    Breast cancer Mother 20   Past Surgical History:  Procedure Laterality Date   BREAST BIOPSY Left 06/20/2022   [place] LT BREAST BX W LOC DEV 1ST LESION IMG BX SPEC US GUIDE 06/20/2022 [facility]   BREAST BIOPSY Left 06/20/2022   MM LT BREAST BX W LOC DEV 1ST LESION IMAGE BX SPEC STEREO GUIDE 06/20/2022 [facility]   BREAST BIOPSY  07/15/2022   MM LT RADIOACTIVE SEED LOC MAMMO GUIDE 07/15/2022 [facility]   BREAST BIOPSY  07/15/2022   MM LT RADIOACTIVE SEED EA ADD LESION LOC MAMMO GUIDE  07/15/2022 [facility]   BREAST LUMPECTOMY WITH RADIOACTIVE SEED AND SENTINEL LYMPH NODE BIOPSY Left 07/16/2022   Procedure: LEFT BREAST LUMPECTOMY WITH RADIOACTIVE SEED X2 AND AXILLARY SENTINEL LYMPH NODE BIOPSY;  Surgeon: Manus Rudd, MD;  Location: [facility];  Service: General;  Laterality: Left;   CATARACT EXTRACTION, BILATERAL     08/14/2022, 08/28/2022   GASTRIC BYPASS     HAND SURGERY Right    LASIK     RE-EXCISION OF  BREAST LUMPECTOMY Left 07/24/2022   Procedure: RE-EXCISION OF LEFT BREAST LUMPECTOMY ANTERIOR AND LATERAL MARGINS;  Surgeon: Manus Rudd, MD;  Location: [facility] OR;  Service: General;  Laterality: Left;  45 MIN ROOM 12   Social History   Social History Narrative   Not on file   Immunization History  Administered Date(s) Administered   Fluad Quad(high Dose 65+) 05/03/2022   Moderna Sars-Covid-2 Vaccination 05/02/2020   PFIZER(Purple Top)SARS-COV-2 Vaccination 07/11/2019, 08/01/2019   PNEUMOCOCCAL CONJUGATE-20 10/04/2020   Zoster Recombinat (Shingrix) 10/06/2017, 12/07/2019, 02/25/2020     Objective: Vital Signs: BP (!) 145/88 (BP Location: Right Arm, Patient Position: Sitting, Cuff Size: Normal)   Pulse 66   Resp 16   Ht 5\' 2"  (1.575 m)   Wt 158 lb 6.4 oz (71.8 kg)   BMI 28.97 kg/m    Physical Exam Vitals and nursing note reviewed.  Constitutional:      Appearance: She is well-developed.  HENT:     Head: Normocephalic and atraumatic.  Eyes:     Conjunctiva/sclera: Conjunctivae normal.  Cardiovascular:     Rate and Rhythm: Normal rate and regular rhythm.     Heart sounds: Normal heart sounds.  Pulmonary:     Effort: Pulmonary effort is normal.     Breath sounds: Normal breath sounds.  Abdominal:     General: Bowel sounds are normal.     Palpations: Abdomen is soft.  Musculoskeletal:     Cervical back: Normal range of motion.  Lymphadenopathy:     Cervical: No cervical adenopathy.  Skin:    General: Skin is warm and dry.     Capillary Refill: Capillary refill takes less than 2 seconds.  Neurological:     Mental Status: She is alert and oriented to person, place, and time.  Psychiatric:        Behavior: Behavior normal.      Musculoskeletal Exam: Cervical spine was in good range of motion.  There was no tenderness over thoracic or lumbar spine.  Mild thoracic kyphosis was noted.  Shoulders, elbows, wrist joints, MCPs PIPs and DIPs been good range of motion with no  synovitis.  Hip joints and knee joints were in good range of motion without any warmth swelling or effusion.  There was no tenderness over ankles or MTPs.  CDAI Exam: CDAI Score: -- Patient Global: 2 mm; Provider Global: 2 mm Swollen: --; Tender: -- Joint Exam 10/08/2022   No joint exam has been documented for this visit   There is currently no information documented on the homunculus. Go to the Rheumatology activity and complete the homunculus joint exam.  Investigation: No additional findings.  Imaging: No results found.  Recent Labs: Lab Results  Component Value Date   WBC 6.6 06/26/2022   HGB 12.8 06/26/2022   PLT 320 06/26/2022   NA 141 06/26/2022   K 4.1 06/26/2022   CL 107 06/26/2022   CO2 29 06/26/2022   GLUCOSE 66 (L) 06/26/2022   BUN 15 06/26/2022   CREATININE 0.59 06/26/2022  BILITOT 0.3 06/26/2022   ALKPHOS 73 06/26/2022   AST 13 (L) 06/26/2022   ALT 10 06/26/2022   PROT 6.1 (L) 06/26/2022   ALBUMIN 3.8 06/26/2022   CALCIUM 9.8 06/26/2022    Speciality Comments: Hydroxychloroquine 200 mg p.o. daily since 2011.  Eye examination December 2023 by Dr. Elmer Picker per patient  Procedures:  No procedures performed Allergies: Dextromethorphan and Promethazine   Assessment / Plan:     Visit Diagnoses: Rheumatoid arthritis involving multiple sites, unspecified whether rheumatoid factor present (HCC) - +RF, +anti-CCP. Dxd in [place] 2000.Ttd with PLQ 200 mg po qd. patient had no synovitis on the examination today.  However she denies any episodes of joint swelling of flares since the last visit.  She has been tolerating hydroxychloroquine without any side effects.  High risk medication use - Hydroxychloroquine 200 mg p.o. daily since 2011.  Eye examination December 2023 by Dr. Elmer Picker per patient.  Repeat eye examination was advised in December 2024.  Her last lab work (CBC and CMP) from June 26, 2022 was reviewed.  She was advised to get labs in July.  Information  regarding rheumatoid arthritis was placed in the AVS.  Primary osteoarthritis of both hands - History of discomfort in bilateral hands.   X-rays were consistent with osteoarthritis.  No synovitis was noted on the examination today.  Primary osteoarthritis of both knees - History of pain in bilateral knee joints.  No synovitis was noted.  X-rays showed moderate chondromalacia patella and moderate osteoarthritis.  Lower extremity muscle strengthening were discussed.  Pain in both feet -no synovitis was noted.  History of pain in bilateral feet.  X-rays showed osteoarthritic changes.  Postural kyphosis of thoracic region  Essential hypertension-blood pressure was elevated at 145/88.  Repeat blood pressure was 155/85.  Patient was advised to monitor blood pressure closely and follow-up with her PCP.  Psychophysiological insomnia  Depression, major, single episode, complete remission (HCC)-she is on Lexapro.  Osteoporosis screening -patient plans to get DEXA scan in July.  Malignant neoplasm of upper-outer quadrant of left breast in female, estrogen receptor positive (HCC) - dxd 02/24 lumpectomy, s/p RTX, no CTX ttd by Dr. Al Pimple.  Orders: No orders of the defined types were placed in this encounter.  Meds ordered this encounter  Medications   hydroxychloroquine (PLAQUENIL) 200 MG tablet    Sig: Take 1 tablet (200 mg total) by mouth in the morning.    Dispense:  90 tablet    Refill:  0     Follow-Up Instructions: Return in about 5 months (around 03/10/2023) for Rheumatoid arthritis.   Pollyann Savoy, MD  Note - This record has been created using Animal nutritionist.  Chart creation errors have been sought, but may not always  have been located. Such creation errors do not reflect on  the standard of medical care.

## 2022-09-27 ENCOUNTER — Ambulatory Visit
Admission: RE | Admit: 2022-09-27 | Discharge: 2022-09-27 | Disposition: A | Discharge status: Home / Self Care | Payer: Medicare HMO | Source: Ambulatory Visit | Attending: Radiation Oncology | Admitting: Radiation Oncology

## 2022-09-27 ENCOUNTER — Other Ambulatory Visit: Payer: Self-pay

## 2022-09-27 ENCOUNTER — Ambulatory Visit: Payer: Medicare HMO

## 2022-09-27 DIAGNOSIS — Z510 Encounter for antineoplastic radiation therapy: Secondary | ICD-10-CM | POA: Diagnosis not present

## 2022-09-27 DIAGNOSIS — C50412 Malignant neoplasm of upper-outer quadrant of left female breast: Secondary | ICD-10-CM | POA: Diagnosis not present

## 2022-09-27 DIAGNOSIS — Z170 Estrogen receptor positive status [ER+]: Secondary | ICD-10-CM | POA: Diagnosis not present

## 2022-09-27 LAB — RAD ONC ARIA SESSION SUMMARY
Course Elapsed Days: 18
Plan Fractions Treated to Date: 14
Plan Prescribed Dose Per Fraction: 2.66 Gy
Plan Total Fractions Prescribed: 16
Plan Total Prescribed Dose: 42.56 Gy
Reference Point Dosage Given to Date: 37.24 Gy
Reference Point Session Dosage Given: 2.66 Gy
Session Number: 14

## 2022-09-30 ENCOUNTER — Other Ambulatory Visit: Payer: Self-pay

## 2022-09-30 ENCOUNTER — Ambulatory Visit
Admission: RE | Admit: 2022-09-30 | Discharge: 2022-09-30 | Disposition: A | Discharge status: Home / Self Care | Payer: Medicare HMO | Source: Ambulatory Visit | Attending: Radiation Oncology | Admitting: Radiation Oncology

## 2022-09-30 DIAGNOSIS — Z170 Estrogen receptor positive status [ER+]: Secondary | ICD-10-CM | POA: Diagnosis not present

## 2022-09-30 DIAGNOSIS — I10 Essential (primary) hypertension: Secondary | ICD-10-CM | POA: Insufficient documentation

## 2022-09-30 DIAGNOSIS — C50412 Malignant neoplasm of upper-outer quadrant of left female breast: Secondary | ICD-10-CM | POA: Diagnosis not present

## 2022-09-30 DIAGNOSIS — Z79899 Other long term (current) drug therapy: Secondary | ICD-10-CM | POA: Diagnosis not present

## 2022-09-30 DIAGNOSIS — F329 Major depressive disorder, single episode, unspecified: Secondary | ICD-10-CM | POA: Insufficient documentation

## 2022-09-30 DIAGNOSIS — Z9884 Bariatric surgery status: Secondary | ICD-10-CM | POA: Diagnosis not present

## 2022-09-30 DIAGNOSIS — Z9221 Personal history of antineoplastic chemotherapy: Secondary | ICD-10-CM | POA: Diagnosis not present

## 2022-09-30 DIAGNOSIS — Z510 Encounter for antineoplastic radiation therapy: Secondary | ICD-10-CM | POA: Diagnosis not present

## 2022-09-30 DIAGNOSIS — M069 Rheumatoid arthritis, unspecified: Secondary | ICD-10-CM | POA: Insufficient documentation

## 2022-09-30 DIAGNOSIS — Z803 Family history of malignant neoplasm of breast: Secondary | ICD-10-CM | POA: Insufficient documentation

## 2022-09-30 LAB — RAD ONC ARIA SESSION SUMMARY
Course Elapsed Days: 21
Plan Fractions Treated to Date: 15
Plan Prescribed Dose Per Fraction: 2.66 Gy
Plan Total Fractions Prescribed: 16
Plan Total Prescribed Dose: 42.56 Gy
Reference Point Dosage Given to Date: 39.9 Gy
Reference Point Session Dosage Given: 2.66 Gy
Session Number: 15

## 2022-10-01 ENCOUNTER — Ambulatory Visit
Admission: RE | Admit: 2022-10-01 | Discharge: 2022-10-01 | Disposition: A | Discharge status: Home / Self Care | Payer: Medicare HMO | Source: Ambulatory Visit | Attending: Radiation Oncology | Admitting: Radiation Oncology

## 2022-10-01 ENCOUNTER — Other Ambulatory Visit: Payer: Self-pay

## 2022-10-01 DIAGNOSIS — Z170 Estrogen receptor positive status [ER+]: Secondary | ICD-10-CM | POA: Diagnosis not present

## 2022-10-01 DIAGNOSIS — C50412 Malignant neoplasm of upper-outer quadrant of left female breast: Secondary | ICD-10-CM | POA: Diagnosis not present

## 2022-10-01 DIAGNOSIS — F329 Major depressive disorder, single episode, unspecified: Secondary | ICD-10-CM | POA: Diagnosis not present

## 2022-10-01 DIAGNOSIS — Z9221 Personal history of antineoplastic chemotherapy: Secondary | ICD-10-CM | POA: Diagnosis not present

## 2022-10-01 DIAGNOSIS — M069 Rheumatoid arthritis, unspecified: Secondary | ICD-10-CM | POA: Diagnosis not present

## 2022-10-01 DIAGNOSIS — Z9884 Bariatric surgery status: Secondary | ICD-10-CM | POA: Diagnosis not present

## 2022-10-01 DIAGNOSIS — Z79899 Other long term (current) drug therapy: Secondary | ICD-10-CM | POA: Diagnosis not present

## 2022-10-01 DIAGNOSIS — I10 Essential (primary) hypertension: Secondary | ICD-10-CM | POA: Diagnosis not present

## 2022-10-01 DIAGNOSIS — Z510 Encounter for antineoplastic radiation therapy: Secondary | ICD-10-CM | POA: Diagnosis not present

## 2022-10-01 LAB — RAD ONC ARIA SESSION SUMMARY
Course Elapsed Days: 22
Plan Fractions Treated to Date: 16
Plan Prescribed Dose Per Fraction: 2.66 Gy
Plan Total Fractions Prescribed: 16
Plan Total Prescribed Dose: 42.56 Gy
Reference Point Dosage Given to Date: 42.56 Gy
Reference Point Session Dosage Given: 2.66 Gy
Session Number: 16

## 2022-10-02 ENCOUNTER — Other Ambulatory Visit: Payer: Self-pay

## 2022-10-02 ENCOUNTER — Inpatient Hospital Stay: Payer: Medicare HMO | Admitting: Hematology and Oncology

## 2022-10-02 ENCOUNTER — Ambulatory Visit
Admission: RE | Admit: 2022-10-02 | Discharge: 2022-10-02 | Disposition: A | Discharge status: Home / Self Care | Payer: Medicare HMO | Source: Ambulatory Visit | Attending: Radiation Oncology | Admitting: Radiation Oncology

## 2022-10-02 VITALS — BP 169/95 | HR 87 | Temp 97.5°F | Resp 18 | Ht 62.0 in | Wt 159.2 lb

## 2022-10-02 DIAGNOSIS — Z170 Estrogen receptor positive status [ER+]: Secondary | ICD-10-CM | POA: Insufficient documentation

## 2022-10-02 DIAGNOSIS — Z79899 Other long term (current) drug therapy: Secondary | ICD-10-CM | POA: Insufficient documentation

## 2022-10-02 DIAGNOSIS — Z9884 Bariatric surgery status: Secondary | ICD-10-CM | POA: Insufficient documentation

## 2022-10-02 DIAGNOSIS — Z9221 Personal history of antineoplastic chemotherapy: Secondary | ICD-10-CM | POA: Insufficient documentation

## 2022-10-02 DIAGNOSIS — M069 Rheumatoid arthritis, unspecified: Secondary | ICD-10-CM | POA: Insufficient documentation

## 2022-10-02 DIAGNOSIS — F329 Major depressive disorder, single episode, unspecified: Secondary | ICD-10-CM | POA: Insufficient documentation

## 2022-10-02 DIAGNOSIS — I10 Essential (primary) hypertension: Secondary | ICD-10-CM | POA: Insufficient documentation

## 2022-10-02 DIAGNOSIS — C50412 Malignant neoplasm of upper-outer quadrant of left female breast: Secondary | ICD-10-CM | POA: Insufficient documentation

## 2022-10-02 DIAGNOSIS — Z803 Family history of malignant neoplasm of breast: Secondary | ICD-10-CM | POA: Insufficient documentation

## 2022-10-02 DIAGNOSIS — Z510 Encounter for antineoplastic radiation therapy: Secondary | ICD-10-CM | POA: Diagnosis not present

## 2022-10-02 LAB — RAD ONC ARIA SESSION SUMMARY
Course Elapsed Days: 23
Plan Fractions Treated to Date: 1
Plan Prescribed Dose Per Fraction: 2 Gy
Plan Total Fractions Prescribed: 4
Plan Total Prescribed Dose: 8 Gy
Reference Point Dosage Given to Date: 2 Gy
Reference Point Session Dosage Given: 2 Gy
Session Number: 17

## 2022-10-02 NOTE — Assessment & Plan Note (Signed)
This is a very pleasant 67 year old postmenopausal female patient with newly diagnosed left breast multifocal IDC, grade 2, the first specimen was ER/PR positive HER2 negative, second specimen was ER positive PR negative and HER2 negative referred to breast MDC for recommendations.  She arrived to the appointment today by herself.  We have reviewed her images and pathology report in the breast MDC.  Although multifocal, the area between the 2 biopsies appears close.  Dr. Corliss Skains is currently planning to lumpectomies.  She is now status post lumpectomy, final pathology showed 8 mm grade 2 IDC, 6 negative sentinel lymph nodes, Oncotype DX of 16, no benefit from adjuvant chemotherapy. She is now undergoing adjuvant radiation, tolerating it well.  She will be completing it following Monday.  We have once again reviewed mechanism of action of aromatase inhibitors, adverse effects including but not limited to hot flashes, vaginal dryness, arthralgias and bone density loss.  She has her bone density scheduled in first week of July.  In the interim I encouraged her to start walking 30 minutes a day 5 days a week and take some vitamin D supplementation as tolerated.  She will start anastrozole about a couple weeks after finishing radiation and will return to clinic in about 3 months to follow-up with survivorship clinic for tox check as well as survivorship visit.

## 2022-10-02 NOTE — Progress Notes (Signed)
[facility] CONSULT NOTE  Patient Care Team: [name], DO as PCP - General (Family Medicine) Donnelly Angelica, RN as Oncology Nurse Navigator Lu Duffel, Margretta Ditty, RN as Oncology Nurse Navigator Manus Rudd, MD as Consulting Physician (General Surgery) Rachel Moulds, MD as Consulting Physician (Hematology and Oncology) Dorothy Puffer, MD as Consulting Physician (Radiation Oncology)  CHIEF COMPLAINTS/PURPOSE OF CONSULTATION:  Newly diagnosed breast cancer  HISTORY OF PRESENTING ILLNESS:  Linda Wood 67 y.o. female is here because of recent diagnosis of left breast cancer. Breast cancer  I reviewed her records extensively and collaborated the history with the patient.  SUMMARY OF ONCOLOGIC HISTORY: Oncology History  Malignant neoplasm of upper-outer quadrant of left breast in female, estrogen receptor positive (HCC)  06/11/2022 Mammogram   Bilateral diagnostic mammogram showed suspicious left breast mass at 1 o'clock position.  Ultrasound-guided biopsy was recommended.  There were also indeterminate left breast calcs recommend stereotactic biopsy.  No suspicious left axillary adenopathy.  Right breast normal   06/20/2022 Pathology Results   Pathology results showed grade 2 invasive ductal carcinoma prognostics from the first specimen at 1:00 10 cm from the nipple showed ER 100% positive strong staining PR 20% positive moderate to strong staining and Ki-67 of 10%, tumor cells negative for HER2 1+.  Second biopsy which was from upper outer quadrant coil shaped clip also showed invasive ductal carcinoma grade 2 prognostics for this 1 showed ER 95% positive strong staining PR 0% negative Ki-67 of 15% and HER2 2+ by IHC and FISH negative   06/25/2022 Initial Diagnosis   Malignant neoplasm of upper-outer quadrant of left breast in female, estrogen receptor positive (HCC)   06/26/2022 Cancer Staging   Staging form: Breast, AJCC 8th Edition - Clinical stage from  06/26/2022: Stage IA (cT1b, cN0, cM0, G2, ER+, PR+, HER2-) - Signed by Ronny Bacon, PA-C on 06/26/2022 Stage prefix: Initial diagnosis Method of lymph node assessment: Clinical Histologic grading system: 3 grade system    Genetic Testing   [facility] Custom Panel+RNA was Negative. 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 Report date was 07/04/2022.  The Custom Hereditary Cancers Panel offered by [facility] includes sequencing and/or deletion duplication testing of the following 43 genes: APC, ATM, AXIN2, BAP1, BARD1, BMPR1A, BRCA1, BRCA2, BRIP1, CDH1, CDK4, CDKN2A (p14ARF and p16INK4a only), CHEK2, CTNNA1, EPCAM (Deletion/duplication testing only), FH, GREM1 (promoter region duplication testing only), HOXB13, KIT, MBD4, MEN1, MLH1, MSH2, MSH3, MSH6, MUTYH, NF1, NHTL1, PALB2, PDGFRA, PMS2, POLD1, POLE, PTEN, RAD51C, RAD51D, SMAD4, SMARCA4. STK11, TP53, TSC1, TSC2, and VHL.    This is a very pleasant 67 year old postmenopausal female patient with newly diagnosed left breast multifocal IDC referred to breast MDC for additional recommendations.   She had baseline has rheumatoid arthritis and takes hydroxychloroquine.  She also had gastric bypass back in 2005 and lost about 100 pounds from this.  Her mother had breast cancer at the age of 83, currently living at 22.  Her father is also living at the age of 66.  No other family history of breast cancer or any other cancers.  She has 2 living children, 1 deceased.  She is here after lumpectomy and reexcision, procedure went well.  She is healing well.  No major issues reported today.  Oncotype DX of 16, no benefit from addition of adjuvant chemotherapy.  She is now almost done with adjuvant radiation, has 3 more days left.  She is under a lot of stress  today since her daughter's dog ran away and her daughter is going through a lot.  She otherwise has tolerated radiation relatively well, has some skin changes  from it.  She denies any major pain issues or symptoms or signs of infection.  Rest of the pertinent 10 point ROS reviewed and negative  MEDICAL HISTORY:  Past Medical History:  Diagnosis Date   Anxiety    Breast cancer (HCC)    Depression    Hypertension    Rheumatoid arthritis (HCC)     SURGICAL HISTORY: Past Surgical History:  Procedure Laterality Date   BREAST BIOPSY Left 06/20/2022   [place] LT BREAST BX W LOC DEV 1ST LESION IMG BX SPEC US GUIDE 06/20/2022 [facility]   BREAST BIOPSY Left 06/20/2022   MM LT BREAST BX W LOC DEV 1ST LESION IMAGE BX SPEC STEREO GUIDE 06/20/2022 [facility]   BREAST BIOPSY  07/15/2022   MM LT RADIOACTIVE SEED LOC MAMMO GUIDE 07/15/2022 [facility]   BREAST BIOPSY  07/15/2022   MM LT RADIOACTIVE SEED EA ADD LESION LOC MAMMO GUIDE 07/15/2022 [facility]   BREAST LUMPECTOMY WITH RADIOACTIVE SEED AND SENTINEL LYMPH NODE BIOPSY Left 07/16/2022   Procedure: LEFT BREAST LUMPECTOMY WITH RADIOACTIVE SEED X2 AND AXILLARY SENTINEL LYMPH NODE BIOPSY;  Surgeon: Manus Rudd, MD;  Location: [facility];  Service: General;  Laterality: Left;   GASTRIC BYPASS     HAND SURGERY Right    LASIK     RE-EXCISION OF BREAST LUMPECTOMY Left 07/24/2022   Procedure: RE-EXCISION OF LEFT BREAST LUMPECTOMY ANTERIOR AND LATERAL MARGINS;  Surgeon: Manus Rudd, MD;  Location: [facility];  Service: General;  Laterality: Left;  45 MIN ROOM 12    SOCIAL HISTORY: Social History   Socioeconomic History   Marital status: Married    Spouse name: [name]   Number of children: Not on file   Years of education: Not on file   Highest education level: Not on file  Occupational History   Not on file  Tobacco Use   Smoking status: Never   Smokeless tobacco: Never  Vaping Use   Vaping Use: Never used  Substance and Sexual Activity   Alcohol use: Yes    Alcohol/week: 3.0 standard drinks of alcohol    Types: 3 Glasses of wine per week    Comment: wine 3 times per week    Drug use: Never   Sexual activity: Yes    Partners: Male  Other Topics Concern   Not on file  Social History Narrative   Not on file   Social Determinants of Health   Financial Resource Strain: Low Risk  (06/26/2022)   Overall Financial Resource Strain (CARDIA)    Difficulty of Paying Living Expenses: Not very hard  Food Insecurity: No Food Insecurity (06/26/2022)   Hunger Vital Sign    Worried About Running Out of Food in the Last Year: Never true    Ran Out of Food in the Last Year: Never true  Transportation Needs: No Transportation Needs (06/26/2022)   PRAPARE - Administrator, Civil Service (Medical): No    Lack of Transportation (Non-Medical): No  Physical Activity: Not on file  Stress: Not on file  Social Connections: Not on file  Intimate Partner Violence: Not on file    FAMILY HISTORY: Family History  Problem Relation Age of Onset   Heart failure Mother    Breast cancer Mother 41    ALLERGIES:  is allergic to dextromethorphan and promethazine.  MEDICATIONS:  Current Outpatient Medications  Medication Sig Dispense Refill   diphenhydrAMINE (BENADRYL) 25 MG tablet Take 25-50 mg by mouth at bedtime as needed for allergies.     ergocalciferol (VITAMIN D2) 1.25 MG (50000 UT) capsule Take 50,000 Units by mouth every Saturday.     escitalopram (LEXAPRO) 20 MG tablet Take 1 tablet (20 mg total) by mouth daily. 90 tablet 2   fluticasone (FLONASE) 50 MCG/ACT nasal spray Place 1 spray into both nostrils daily as needed for allergies or rhinitis.     hydroxychloroquine (PLAQUENIL) 200 MG tablet Take 1 tablet (200 mg total) by mouth in the morning. 90 tablet 0   loperamide (IMODIUM) 2 MG capsule Take 4 mg by mouth as needed for diarrhea or loose stools.     losartan (COZAAR) 50 MG tablet TAKE 1 TABLET (50 MG TOTAL) BY MOUTH IN THE MORNING 30 tablet 1   Multiple Vitamin (MULTIVITAMIN WITH MINERALS) TABS tablet Take 2 tablets by mouth daily.     oxyCODONE (OXY  IR/ROXICODONE) 5 MG immediate release tablet Take 1 tablet (5 mg total) by mouth every 6 (six) hours as needed for severe pain. 15 tablet 0   traZODone (DESYREL) 50 MG tablet Take 0.5-1 tablets (25-50 mg total) by mouth at bedtime as needed for sleep. (Patient taking differently: Take 50 mg by mouth at bedtime.) 90 tablet 2   No current facility-administered medications for this visit.    REVIEW OF SYSTEMS:   Constitutional: Denies fevers, chills or abnormal night sweats Eyes: Denies blurriness of vision, double vision or watery eyes Ears, nose, mouth, throat, and face: Denies mucositis or sore throat Respiratory: Denies cough, dyspnea or wheezes Cardiovascular: Denies palpitation, chest discomfort or lower extremity swelling Gastrointestinal:  Denies nausea, heartburn or change in bowel habits Skin: Denies abnormal skin rashes Lymphatics: Denies new lymphadenopathy or easy bruising Neurological:Denies numbness, tingling or new weaknesses Behavioral/Psych: Mood is stable, no new changes  Breast: Denies any palpable lumps or discharge All other systems were reviewed with the patient and are negative.  PHYSICAL EXAMINATION: ECOG PERFORMANCE STATUS: 0 - Asymptomatic  Vitals:   10/02/22 1310  BP: (!) 169/95  Pulse: 87  Resp: 18  Temp: (!) 97.5 F (36.4 C)  SpO2: 100%   Filed Weights   10/02/22 1310  Weight: 159 lb 3.2 oz (72.2 kg)    GENERAL:alert, no distress and comfortable. Rest of the physical exam deferred in lieu of counseling  LABORATORY DATA:  I have reviewed the data as listed Lab Results  Component Value Date   WBC 6.6 06/26/2022   HGB 12.8 06/26/2022   HCT 39.6 06/26/2022   MCV 86.7 06/26/2022   PLT 320 06/26/2022   Lab Results  Component Value Date   NA 141 06/26/2022   K 4.1 06/26/2022   CL 107 06/26/2022   CO2 29 06/26/2022    RADIOGRAPHIC STUDIES: I have personally reviewed the radiological reports and agreed with the findings in the  report.  ASSESSMENT AND PLAN:   Malignant neoplasm of upper-outer quadrant of left breast in female, estrogen receptor positive (HCC) This is a very pleasant 67 year old postmenopausal female patient with newly diagnosed left breast multifocal IDC, grade 2, the first specimen was ER/PR positive HER2 negative, second specimen was ER positive PR negative and HER2 negative referred to breast MDC for recommendations.  She arrived to the appointment today by herself.  We have reviewed her images and pathology report in the breast MDC.  Although multifocal, the area  between the 2 biopsies appears close.  Dr. Corliss Skains is currently planning to lumpectomies.  She is now status post lumpectomy, final pathology showed 8 mm grade 2 IDC, 6 negative sentinel lymph nodes, Oncotype DX of 16, no benefit from adjuvant chemotherapy. She is now undergoing adjuvant radiation, tolerating it well.  She will be completing it following Monday.  We have once again reviewed mechanism of action of aromatase inhibitors, adverse effects including but not limited to hot flashes, vaginal dryness, arthralgias and bone density loss.  She has her bone density scheduled in first week of July.  In the interim I encouraged her to start walking 30 minutes a day 5 days a week and take some vitamin D supplementation as tolerated.  She will start anastrozole about a couple weeks after finishing radiation and will return to clinic in about 3 months to follow-up with survivorship clinic for tox check as well as survivorship visit.   Total time spent: 20 min including History, physical exam, review of records, counseling and coordination of care. All questions were answered. The patient knows to call the clinic with any problems, questions or concerns.    Rachel Moulds, MD 10/02/22

## 2022-10-03 ENCOUNTER — Telehealth: Payer: Self-pay | Admitting: Hematology and Oncology

## 2022-10-03 ENCOUNTER — Other Ambulatory Visit: Payer: Self-pay

## 2022-10-03 ENCOUNTER — Ambulatory Visit
Admission: RE | Admit: 2022-10-03 | Discharge: 2022-10-03 | Disposition: A | Discharge status: Home / Self Care | Payer: Medicare HMO | Source: Ambulatory Visit | Attending: Radiation Oncology | Admitting: Radiation Oncology

## 2022-10-03 DIAGNOSIS — Z9221 Personal history of antineoplastic chemotherapy: Secondary | ICD-10-CM | POA: Diagnosis not present

## 2022-10-03 DIAGNOSIS — F329 Major depressive disorder, single episode, unspecified: Secondary | ICD-10-CM | POA: Diagnosis not present

## 2022-10-03 DIAGNOSIS — Z510 Encounter for antineoplastic radiation therapy: Secondary | ICD-10-CM | POA: Diagnosis not present

## 2022-10-03 DIAGNOSIS — Z170 Estrogen receptor positive status [ER+]: Secondary | ICD-10-CM | POA: Diagnosis not present

## 2022-10-03 DIAGNOSIS — M069 Rheumatoid arthritis, unspecified: Secondary | ICD-10-CM | POA: Diagnosis not present

## 2022-10-03 DIAGNOSIS — Z9884 Bariatric surgery status: Secondary | ICD-10-CM | POA: Diagnosis not present

## 2022-10-03 DIAGNOSIS — C50412 Malignant neoplasm of upper-outer quadrant of left female breast: Secondary | ICD-10-CM | POA: Diagnosis not present

## 2022-10-03 DIAGNOSIS — Z79899 Other long term (current) drug therapy: Secondary | ICD-10-CM | POA: Diagnosis not present

## 2022-10-03 DIAGNOSIS — I10 Essential (primary) hypertension: Secondary | ICD-10-CM | POA: Diagnosis not present

## 2022-10-03 LAB — RAD ONC ARIA SESSION SUMMARY
Course Elapsed Days: 24
Plan Fractions Treated to Date: 2
Plan Prescribed Dose Per Fraction: 2 Gy
Plan Total Fractions Prescribed: 4
Plan Total Prescribed Dose: 8 Gy
Reference Point Dosage Given to Date: 4 Gy
Reference Point Session Dosage Given: 2 Gy
Session Number: 18

## 2022-10-03 NOTE — Telephone Encounter (Signed)
Spoke with patient confirming upcoming appointment  

## 2022-10-04 ENCOUNTER — Encounter: Payer: Self-pay | Admitting: Genetic Counselor

## 2022-10-04 ENCOUNTER — Ambulatory Visit
Admission: RE | Admit: 2022-10-04 | Discharge: 2022-10-04 | Disposition: A | Discharge status: Home / Self Care | Payer: Medicare HMO | Source: Ambulatory Visit | Attending: Radiation Oncology | Admitting: Radiation Oncology

## 2022-10-04 ENCOUNTER — Other Ambulatory Visit: Payer: Self-pay

## 2022-10-04 DIAGNOSIS — Z79899 Other long term (current) drug therapy: Secondary | ICD-10-CM | POA: Diagnosis not present

## 2022-10-04 DIAGNOSIS — F329 Major depressive disorder, single episode, unspecified: Secondary | ICD-10-CM | POA: Diagnosis not present

## 2022-10-04 DIAGNOSIS — I10 Essential (primary) hypertension: Secondary | ICD-10-CM | POA: Diagnosis not present

## 2022-10-04 DIAGNOSIS — C50412 Malignant neoplasm of upper-outer quadrant of left female breast: Secondary | ICD-10-CM | POA: Diagnosis not present

## 2022-10-04 DIAGNOSIS — Z9884 Bariatric surgery status: Secondary | ICD-10-CM | POA: Diagnosis not present

## 2022-10-04 DIAGNOSIS — Z9221 Personal history of antineoplastic chemotherapy: Secondary | ICD-10-CM | POA: Diagnosis not present

## 2022-10-04 DIAGNOSIS — Z510 Encounter for antineoplastic radiation therapy: Secondary | ICD-10-CM | POA: Diagnosis not present

## 2022-10-04 DIAGNOSIS — Z170 Estrogen receptor positive status [ER+]: Secondary | ICD-10-CM | POA: Diagnosis not present

## 2022-10-04 DIAGNOSIS — M069 Rheumatoid arthritis, unspecified: Secondary | ICD-10-CM | POA: Diagnosis not present

## 2022-10-04 LAB — RAD ONC ARIA SESSION SUMMARY
Course Elapsed Days: 25
Plan Fractions Treated to Date: 3
Plan Prescribed Dose Per Fraction: 2 Gy
Plan Total Fractions Prescribed: 4
Plan Total Prescribed Dose: 8 Gy
Reference Point Dosage Given to Date: 6 Gy
Reference Point Session Dosage Given: 2 Gy
Session Number: 19

## 2022-10-04 NOTE — Progress Notes (Signed)
UPDATE: NF1 c.5668A>G ([id number]) VUS has been amended to Likely Benign. Amended report date is 08/19/2022.

## 2022-10-07 ENCOUNTER — Other Ambulatory Visit: Payer: Self-pay

## 2022-10-07 ENCOUNTER — Ambulatory Visit
Admission: RE | Admit: 2022-10-07 | Discharge: 2022-10-07 | Disposition: A | Discharge status: Home / Self Care | Payer: Medicare HMO | Source: Ambulatory Visit | Attending: Radiation Oncology | Admitting: Radiation Oncology

## 2022-10-07 DIAGNOSIS — I10 Essential (primary) hypertension: Secondary | ICD-10-CM | POA: Diagnosis not present

## 2022-10-07 DIAGNOSIS — Z9221 Personal history of antineoplastic chemotherapy: Secondary | ICD-10-CM | POA: Diagnosis not present

## 2022-10-07 DIAGNOSIS — Z170 Estrogen receptor positive status [ER+]: Secondary | ICD-10-CM | POA: Diagnosis not present

## 2022-10-07 DIAGNOSIS — F329 Major depressive disorder, single episode, unspecified: Secondary | ICD-10-CM | POA: Diagnosis not present

## 2022-10-07 DIAGNOSIS — Z9884 Bariatric surgery status: Secondary | ICD-10-CM | POA: Diagnosis not present

## 2022-10-07 DIAGNOSIS — M069 Rheumatoid arthritis, unspecified: Secondary | ICD-10-CM | POA: Diagnosis not present

## 2022-10-07 DIAGNOSIS — C50412 Malignant neoplasm of upper-outer quadrant of left female breast: Secondary | ICD-10-CM | POA: Diagnosis not present

## 2022-10-07 DIAGNOSIS — Z79899 Other long term (current) drug therapy: Secondary | ICD-10-CM | POA: Diagnosis not present

## 2022-10-07 DIAGNOSIS — Z510 Encounter for antineoplastic radiation therapy: Secondary | ICD-10-CM | POA: Diagnosis not present

## 2022-10-07 LAB — RAD ONC ARIA SESSION SUMMARY
Course Elapsed Days: 28
Plan Fractions Treated to Date: 4
Plan Prescribed Dose Per Fraction: 2 Gy
Plan Total Fractions Prescribed: 4
Plan Total Prescribed Dose: 8 Gy
Reference Point Dosage Given to Date: 8 Gy
Reference Point Session Dosage Given: 2 Gy
Session Number: 20

## 2022-10-08 ENCOUNTER — Ambulatory Visit: Payer: Medicare HMO | Attending: Rheumatology | Admitting: Rheumatology

## 2022-10-08 ENCOUNTER — Encounter: Payer: Self-pay | Admitting: Rheumatology

## 2022-10-08 ENCOUNTER — Other Ambulatory Visit (HOSPITAL_BASED_OUTPATIENT_CLINIC_OR_DEPARTMENT_OTHER): Payer: Self-pay

## 2022-10-08 VITALS — BP 155/85 | HR 67 | Resp 16 | Ht 62.0 in | Wt 158.4 lb

## 2022-10-08 DIAGNOSIS — Z170 Estrogen receptor positive status [ER+]: Secondary | ICD-10-CM

## 2022-10-08 DIAGNOSIS — I10 Essential (primary) hypertension: Secondary | ICD-10-CM

## 2022-10-08 DIAGNOSIS — M19041 Primary osteoarthritis, right hand: Secondary | ICD-10-CM

## 2022-10-08 DIAGNOSIS — M069 Rheumatoid arthritis, unspecified: Secondary | ICD-10-CM | POA: Diagnosis not present

## 2022-10-08 DIAGNOSIS — Z79899 Other long term (current) drug therapy: Secondary | ICD-10-CM | POA: Diagnosis not present

## 2022-10-08 DIAGNOSIS — M79671 Pain in right foot: Secondary | ICD-10-CM

## 2022-10-08 DIAGNOSIS — F5104 Psychophysiologic insomnia: Secondary | ICD-10-CM | POA: Diagnosis not present

## 2022-10-08 DIAGNOSIS — Z13820 Encounter for screening for osteoporosis: Secondary | ICD-10-CM

## 2022-10-08 DIAGNOSIS — C50412 Malignant neoplasm of upper-outer quadrant of left female breast: Secondary | ICD-10-CM

## 2022-10-08 DIAGNOSIS — F325 Major depressive disorder, single episode, in full remission: Secondary | ICD-10-CM | POA: Diagnosis not present

## 2022-10-08 DIAGNOSIS — M170 Bilateral primary osteoarthritis of knee: Secondary | ICD-10-CM

## 2022-10-08 DIAGNOSIS — M79672 Pain in left foot: Secondary | ICD-10-CM

## 2022-10-08 DIAGNOSIS — M4004 Postural kyphosis, thoracic region: Secondary | ICD-10-CM | POA: Diagnosis not present

## 2022-10-08 DIAGNOSIS — M19042 Primary osteoarthritis, left hand: Secondary | ICD-10-CM

## 2022-10-08 MED ORDER — HYDROXYCHLOROQUINE SULFATE 200 MG PO TABS
200.0000 mg | ORAL_TABLET | Freq: Every morning | ORAL | 0 refills | Status: DC
  Filled 2022-10-08: qty 90, 90d supply, fill #0

## 2022-10-08 NOTE — Patient Instructions (Signed)
Standing Labs We placed an order today for your standing lab work.   Please have your standing labs (CBC with differential and CMP with GFR) drawn in July  Please have your labs drawn 2 weeks prior to your appointment so that the provider can discuss your lab results at your appointment, if possible.  Please note that you may see your imaging and lab results in MyChart before we have reviewed them. We will contact you once all results are reviewed. Please allow our office up to 72 hours to thoroughly review all of the results before contacting the office for clarification of your results.  WALK-IN LAB HOURS  Monday through Thursday from 8:00 am -12:30 pm and 1:00 pm-5:00 pm and Friday from 8:00 am-12:00 pm.  Patients with office visits requiring labs will be seen before walk-in labs.  You may encounter longer than normal wait times. Please allow additional time. Wait times may be shorter on  Monday and Thursday afternoons.  We do not book appointments for walk-in labs. We appreciate your patience and understanding with our staff.   Labs are drawn by [facility]. Please bring your co-pay at the time of your lab draw.  You may receive a bill from [facility] for your lab work.  Please note if you are on Hydroxychloroquine and and an order has been placed for a Hydroxychloroquine level,  you will need to have it drawn 4 hours or more after your last dose.  If you wish to have your labs drawn at another location, please call the office 24 hours in advance so we can fax the orders.  The office is located at [address], [place], [place] [address]   If you have any questions regarding directions or hours of operation,  please call [phone].   As a reminder, please drink plenty of water prior to coming for your lab work. Thanks!   Vaccines You are taking a medication(s) that can suppress your immune system.  The following immunizations are recommended: Flu annually Covid-19  Td/Tdap  (tetanus, diphtheria, pertussis) every 10 years Pneumonia (Prevnar 15 then Pneumovax 23 at least 1 year apart.  Alternatively, can take Prevnar 20 without needing additional dose) Shingrix: 2 doses from 4 weeks to 6 months apart  Please check with your PCP to make sure you are up to date.

## 2022-10-10 NOTE — Radiation Completion Notes (Addendum)
  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   Intent: Curative     ==========DELIVERED PLANS==========  First Treatment Date: 2022-09-09 - Last Treatment Date: 2022-10-07   Plan Name: Breast_L_BH Site: Breast, Left Technique: 3D Mode: Photon Dose Per Fraction: 2.66 Gy Prescribed Dose (Delivered / Prescribed): 42.56 Gy / 42.56 Gy Prescribed Fxs (Delivered / Prescribed): 16 / 16   Plan Name: Brst_L_Bst_BH Site: Breast, Left Technique: 3D Mode: Photon Dose Per Fraction: 2 Gy Prescribed Dose (Delivered / Prescribed): 8 Gy / 8 Gy Prescribed Fxs (Delivered / Prescribed): 4 / 4     ==========ON TREATMENT VISIT DATES========== 2022-09-13, 2022-09-20, 2022-09-27, 2022-10-04    See weekly On Treatment Notes in Epic for details. The patient tolerated radiation. She developed fatigue and anticipated skin changes in the treatment field.   The patient will receive a call in about one month from the radiation oncology department. She will continue follow up with Dr. Al Pimple as well.      Osker Mason, PAC

## 2022-10-16 ENCOUNTER — Telehealth: Payer: Self-pay | Admitting: *Deleted

## 2022-10-16 NOTE — Telephone Encounter (Signed)
Pt left VM later in day stating she is having "blisters on blisters now on my breast since completing the radiation therapy"  Per return call - pt states she used the cream provided by radiation "but it had alcohol in it and it made it hurt even worse- I do not want to use it! "   [place] states blisters are weeping- but she does not have any odor (such as an infection) from the weeping.  She is asking if she can now use ice.  This RN informed she can use cold compresses using a cotton barrier between it and her skin- Ice should be avoided at this time.  Pt stated she has the softer movable ice packs she can use. This RN also discussed use of ibuprofen for discomfort.  Informed pt this note will be forwarded to radiation provider and nurse for further follow up and recommendations which will be followed up tomorrow due to lateness in the day.  Pt stated understanding and appreciation of return call.

## 2022-10-17 ENCOUNTER — Telehealth: Payer: Self-pay | Admitting: *Deleted

## 2022-10-17 NOTE — Telephone Encounter (Signed)
I called the patient to follow up on a mychart message we received.  She reports she continues to have blisters in the area of her breast radiation that are weeping.  I asked if she could upload a photo to her mychart for provider review and she refused stating she talked with another nurse yesterday who recommended ice packs and she feels much better after using them last night.  Directions for ice pack use with recent radiated skin were discussed.  She was encouraged to give our office a call should she have any other concerns.  She verbalized understanding and was appreciative of the call.  Lind Covert RN, BSN

## 2022-10-29 ENCOUNTER — Telehealth: Payer: Self-pay | Admitting: *Deleted

## 2022-10-29 ENCOUNTER — Other Ambulatory Visit: Payer: Self-pay | Admitting: Family Medicine

## 2022-10-29 ENCOUNTER — Other Ambulatory Visit: Payer: Self-pay | Admitting: *Deleted

## 2022-10-29 ENCOUNTER — Other Ambulatory Visit (HOSPITAL_BASED_OUTPATIENT_CLINIC_OR_DEPARTMENT_OTHER): Payer: Self-pay

## 2022-10-29 DIAGNOSIS — I10 Essential (primary) hypertension: Secondary | ICD-10-CM

## 2022-10-29 MED ORDER — LOSARTAN POTASSIUM 50 MG PO TABS
50.0000 mg | ORAL_TABLET | Freq: Every morning | ORAL | 1 refills | Status: DC
  Filled 2022-10-29: qty 30, 30d supply, fill #0
  Filled 2022-12-16: qty 30, 30d supply, fill #1

## 2022-10-29 MED ORDER — ANASTROZOLE 1 MG PO TABS
1.0000 mg | ORAL_TABLET | Freq: Every day | ORAL | 3 refills | Status: DC
  Filled 2022-10-29: qty 30, 30d supply, fill #0
  Filled 2022-12-16: qty 30, 30d supply, fill #1
  Filled 2023-01-24: qty 30, 30d supply, fill #2
  Filled 2023-03-20: qty 30, 30d supply, fill #3

## 2022-10-29 MED ORDER — ESCITALOPRAM OXALATE 20 MG PO TABS
20.0000 mg | ORAL_TABLET | Freq: Every day | ORAL | 0 refills | Status: DC
  Filled 2022-10-29: qty 30, 30d supply, fill #0

## 2022-10-29 NOTE — Telephone Encounter (Signed)
Pt left a VM stating she feels well healed and ready to start the medication.  Verified with MD and prescription for anastrozole sent to requested pharmacy.  This RN returned call to pt and obtained VM- message left per above.

## 2022-11-03 ENCOUNTER — Other Ambulatory Visit: Payer: Self-pay | Admitting: Family Medicine

## 2022-11-04 ENCOUNTER — Ambulatory Visit: Payer: Medicare HMO | Attending: Surgery

## 2022-11-04 ENCOUNTER — Encounter: Payer: Self-pay | Admitting: Family Medicine

## 2022-11-04 ENCOUNTER — Other Ambulatory Visit (HOSPITAL_BASED_OUTPATIENT_CLINIC_OR_DEPARTMENT_OTHER): Payer: Self-pay

## 2022-11-04 ENCOUNTER — Ambulatory Visit (INDEPENDENT_AMBULATORY_CARE_PROVIDER_SITE_OTHER): Payer: Medicare HMO | Admitting: Family Medicine

## 2022-11-04 VITALS — BP 121/81 | HR 82 | Temp 98.0°F | Ht 62.0 in | Wt 158.4 lb

## 2022-11-04 VITALS — Wt 158.4 lb

## 2022-11-04 DIAGNOSIS — Z483 Aftercare following surgery for neoplasm: Secondary | ICD-10-CM | POA: Insufficient documentation

## 2022-11-04 DIAGNOSIS — I10 Essential (primary) hypertension: Secondary | ICD-10-CM

## 2022-11-04 DIAGNOSIS — Z1159 Encounter for screening for other viral diseases: Secondary | ICD-10-CM

## 2022-11-04 DIAGNOSIS — Z0000 Encounter for general adult medical examination without abnormal findings: Secondary | ICD-10-CM

## 2022-11-04 MED ORDER — FLUTICASONE PROPIONATE 50 MCG/ACT NA SUSP
1.0000 | Freq: Every day | NASAL | 5 refills | Status: DC | PRN
  Filled 2022-11-04: qty 16, 30d supply, fill #0

## 2022-11-04 NOTE — Progress Notes (Addendum)
Chief Complaint  Patient presents with   Annual Exam     Well Woman Linda Wood is here for a complete physical.   Her last physical was >1 year ago.  Current diet: in general, a "healthy" diet. Current exercise: none. Weight is stable and she denies daytime fatigue. Seatbelt? Yes Advanced directive? No  Health Maintenance Colonoscopy- Yes Shingrix- Yes DEXA- Yes Mammogram- Yes Tetanus- Yes Pneumonia- Yes Hep C screen- No  Past Medical History:  Diagnosis Date   Anxiety    Breast cancer (HCC)    Depression    Hypertension    Rheumatoid arthritis (HCC)      Past Surgical History:  Procedure Laterality Date   BREAST BIOPSY Left 06/20/2022   [place] LT BREAST BX W LOC DEV 1ST LESION IMG BX SPEC US GUIDE 06/20/2022 [facility]   BREAST BIOPSY Left 06/20/2022   MM LT BREAST BX W LOC DEV 1ST LESION IMAGE BX SPEC STEREO GUIDE 06/20/2022 [facility]   BREAST BIOPSY  07/15/2022   MM LT RADIOACTIVE SEED LOC MAMMO GUIDE 07/15/2022 [facility]   BREAST BIOPSY  07/15/2022   MM LT RADIOACTIVE SEED EA ADD LESION LOC MAMMO GUIDE 07/15/2022 [facility]   BREAST LUMPECTOMY WITH RADIOACTIVE SEED AND SENTINEL LYMPH NODE BIOPSY Left 07/16/2022   Procedure: LEFT BREAST LUMPECTOMY WITH RADIOACTIVE SEED X2 AND AXILLARY SENTINEL LYMPH NODE BIOPSY;  Surgeon: Manus Rudd, MD;  Location: [facility] OR;  Service: General;  Laterality: Left;   CATARACT EXTRACTION, BILATERAL     08/14/2022, 08/28/2022   GASTRIC BYPASS     HAND SURGERY Right    LASIK     RE-EXCISION OF BREAST LUMPECTOMY Left 07/24/2022   Procedure: RE-EXCISION OF LEFT BREAST LUMPECTOMY ANTERIOR AND LATERAL MARGINS;  Surgeon: Manus Rudd, MD;  Location: [facility] OR;  Service: General;  Laterality: Left;  45 MIN ROOM 12    Medications  Current Outpatient Medications on File Prior to Visit  Medication Sig Dispense Refill   diphenhydrAMINE (BENADRYL) 25 MG tablet Take 25-50 mg by mouth at bedtime as needed for  allergies.     ergocalciferol (VITAMIN D2) 1.25 MG (50000 UT) capsule Take 50,000 Units by mouth every Saturday.     escitalopram (LEXAPRO) 20 MG tablet Take 1 tablet (20 mg total) by mouth daily. 30 tablet 0   fluticasone (FLONASE) 50 MCG/ACT nasal spray Place 1 spray into both nostrils daily as needed for allergies or rhinitis.     hydroxychloroquine (PLAQUENIL) 200 MG tablet TAKE 1 TABLET (200 MG) BY MOUTH IN THE MORNING 90 tablet 2   loperamide (IMODIUM) 2 MG capsule Take 4 mg by mouth as needed for diarrhea or loose stools.     losartan (COZAAR) 50 MG tablet Take 1 tablet (50 mg total) by mouth in the morning. 30 tablet 1   Multiple Vitamin (MULTIVITAMIN WITH MINERALS) TABS tablet Take 2 tablets by mouth daily.     traZODone (DESYREL) 50 MG tablet Take 0.5-1 tablets (25-50 mg total) by mouth at bedtime as needed for sleep. (Patient taking differently: Take 50 mg by mouth at bedtime.) 90 tablet 2   anastrozole (ARIMIDEX) 1 MG tablet Take 1 tablet (1 mg total) by mouth daily. (Patient not taking: Reported on 11/04/2022) 30 tablet 3    Allergies Allergies  Allergen Reactions   Dextromethorphan Anaphylaxis   Promethazine     Dizziness     Review of Systems: Constitutional:  no fevers Eye:  no recent significant change in vision Ears: +fullness Nose/Mouth/Throat:  no complaints of nasal congestion, no sore throat Cardiovascular: no chest pain Respiratory:  No shortness of breath Gastrointestinal:  No change in bowel habits GU:  Female: negative for dysuria Integumentary:  no abnormal skin lesions reported Neurologic:  no headaches Endocrine:  denies unexplained weight changes  Exam BP 121/81 (BP Location: Left Arm, Patient Position: Sitting, Cuff Size: Normal)   Pulse 82   Temp 98 F (36.7 C) (Oral)   Ht 5\' 2"  (1.575 m)   Wt 158 lb 6 oz (71.8 kg)   SpO2 98%   BMI 28.97 kg/m  General:  well developed, well nourished, in no apparent distress Skin:  no significant moles, warts,  or growths Head:  no masses, lesions, or tenderness Eyes:  pupils equal and round, sclera anicteric without injection Ears:  canal on R without lesions, TM shiny without retraction, no obvious effusion, no erythema; 100% obstructed with cerumen on the left Nose:  nares patent, mucosa normal, and no drainage Throat/Pharynx:  lips and gingiva without lesion; tongue and uvula midline; non-inflamed pharynx; no exudates or postnasal drainage Neck: neck supple without adenopathy, thyromegaly, or masses Lungs:  clear to auscultation, breath sounds equal bilaterally, no respiratory distress Cardio:  regular rate and rhythm, no bruits or LE edema Abdomen:  abdomen soft, nontender; bowel sounds normal; no masses or organomegaly Genital: Deferred Neuro:  gait normal; deep tendon reflexes normal and symmetric Psych: well oriented with normal range of affect and appropriate judgment/insight  Procedure note: Cerumen removal irrigation Verbal consent obtained. [name], CMA performed procedure. A mixture of warm water and Dulcolax was used to irrigate ear. Some cerumen removed. I tried to remove w alligator forceps- some wax was dislodged and brought some skin with it causing minimal bleeding.  Unfortunately she could not tolerate any more and we stopped.  There were no other immediate complications noted.  Assessment and Plan  Well adult exam  Essential hypertension - Plan: Comprehensive metabolic panel, Lipid panel, CBC  Encounter for hepatitis C screening test for low risk patient - Plan: Hepatitis C antibody   Well 67 y.o. female. Counseled on diet and exercise. Advanced directive form provided today.  Ear wax removal today-not very successful. Home care instructions verbalized and written down.  Will get her back on Flonase. Could consider ENT referral if not improving.  Stretches/exercises for shoulder pain which is improving. Suspect prox bicep tendon.  Other orders as above. Will be  coming back for labs.  Follow up in 6 mo for med ck. The patient voiced understanding and agreement to the plan.  Jilda Roche Kidder, DO 11/04/22 2:41 PM

## 2022-11-04 NOTE — Therapy (Signed)
OUTPATIENT PHYSICAL THERAPY SOZO SCREENING NOTE   Patient Name: Linda Wood MRN: [id number] DOB:02/08/1956, 67 y.o., female Today's Date: 11/04/2022  PCP: [name], DO REFERRING PROVIDER: Manus Rudd, MD   PT End of Session - 11/04/22 1558     Visit Number 2   # unchanged due to screen only   PT Start Time 1556    PT Stop Time 1559    PT Time Calculation (min) 3 min    Activity Tolerance Patient tolerated treatment well    Behavior During Therapy WFL for tasks assessed/performed             Past Medical History:  Diagnosis Date   Anxiety    Breast cancer (HCC)    Depression    Hypertension    Rheumatoid arthritis (HCC)    Past Surgical History:  Procedure Laterality Date   BREAST BIOPSY Left 06/20/2022   [place] LT BREAST BX W LOC DEV 1ST LESION IMG BX SPEC US GUIDE 06/20/2022 [facility]   BREAST BIOPSY Left 06/20/2022   MM LT BREAST BX W LOC DEV 1ST LESION IMAGE BX SPEC STEREO GUIDE 06/20/2022 [facility]   BREAST BIOPSY  07/15/2022   MM LT RADIOACTIVE SEED LOC MAMMO GUIDE 07/15/2022 [facility]   BREAST BIOPSY  07/15/2022   MM LT RADIOACTIVE SEED EA ADD LESION LOC MAMMO GUIDE 07/15/2022 [facility]   BREAST LUMPECTOMY WITH RADIOACTIVE SEED AND SENTINEL LYMPH NODE BIOPSY Left 07/16/2022   Procedure: LEFT BREAST LUMPECTOMY WITH RADIOACTIVE SEED X2 AND AXILLARY SENTINEL LYMPH NODE BIOPSY;  Surgeon: Manus Rudd, MD;  Location: [facility];  Service: General;  Laterality: Left;   CATARACT EXTRACTION, BILATERAL     08/14/2022, 08/28/2022   GASTRIC BYPASS     HAND SURGERY Right    LASIK     RE-EXCISION OF BREAST LUMPECTOMY Left 07/24/2022   Procedure: RE-EXCISION OF LEFT BREAST LUMPECTOMY ANTERIOR AND LATERAL MARGINS;  Surgeon: Manus Rudd, MD;  Location: [facility];  Service: General;  Laterality: Left;  45 MIN ROOM 12   Patient Active Problem List   Diagnosis Date Noted   Genetic testing 07/05/2022   Family history of breast cancer  06/27/2022   Malignant neoplasm of upper-outer quadrant of left breast in female, estrogen receptor positive (HCC) 06/25/2022   Psychophysiological insomnia 11/28/2021   Rheumatoid arthritis (HCC) 10/29/2021   Essential hypertension 10/29/2021   Depression, major, single episode, complete remission (HCC) 10/29/2021   Rib fractures 10/15/2021    REFERRING DIAG: left breast cancer at risk for lymphedema  THERAPY DIAG:  Aftercare following surgery for neoplasm  PERTINENT HISTORY: Patient was diagnosed on 06/20/2022 with left grade 2 invasive ductal carcinoma breast cancer. It measures 7 mm and is located in the upper outer quadrant. It is ER/PR positive and HER2 negative with a Ki67 of 10% . She had a double  left lumpectomy with SLNB on 07/16/2022, followed by a re-excision on 07/24/2022 to get clear margins   PRECAUTIONS: left UE Lymphedema risk, None  SUBJECTIVE: Pt returns for her first 3 month L-Dex screen.   PAIN:  Are you having pain? No  SOZO SCREENING: Patient was assessed today using the SOZO machine to determine the lymphedema index score. This was compared to her baseline score. It was determined that she is within the recommended range when compared to her baseline and no further action is needed at this time. She will continue SOZO screenings. These are done every 3 months for 2 years post operatively followed  by every 6 months for 2 years, and then annually.   L-DEX FLOWSHEETS - 11/04/22 1600       L-DEX LYMPHEDEMA SCREENING   Measurement Type Unilateral    L-DEX MEASUREMENT EXTREMITY Upper Extremity    POSITION  Standing    DOMINANT SIDE Right    At Risk Side Left    BASELINE SCORE (UNILATERAL) 8.2    L-DEX SCORE (UNILATERAL) 6.4    VALUE CHANGE (UNILAT) -1.8               Hermenia Bers, PTA 11/04/2022, 4:00 PM

## 2022-11-04 NOTE — Patient Instructions (Addendum)
Give [place] 2-3 business days to get the results of your labs back.   Keep the diet clean and stay active.  Please get me a copy of your advanced directive form at your convenience.   OK to use Debrox (peroxide) in the ear to loosen up wax. Also recommend using a bulb syringe (for removing boogers from baby's noses) to flush through warm water and vinegar (3-4:1 ratio). An alternative, though more expensive, is an elephant ear washer wax removal kit. Do not use Q-tips as this can impact wax further.  Let us know if you need anything.  Biceps Tendon Disruption (Proximal) Rehab Do exercises exactly as told by your health care provider and adjust them as directed. It is normal to feel mild stretching, pulling, tightness, or discomfort as you do these exercises, but you should stop right away if you feel sudden pain or your pain gets worse.  Stretching and range of motion exercises These exercises warm up your muscles and joints and improve the movement and flexibility of your arm and shoulder. These exercises also help to relieve pain and stiffness. Exercise A: Shoulder flexion, standing   Stand facing a wall. Put your left / right hand on the wall. Slide your left / right hand up the wall. Stop when you feel a stretch in your shoulder, or when you reach the angle recommended by your health care provider. Use your other hand to help raise your arm, if needed. As your hand gets higher, you may need to step closer to the wall. Avoid shrugging your shoulder while you raise your arm. To do this, keep your shoulder blade tucked down toward your spine. Hold for 30 seconds. Slowly return to the starting position. Use your other arm to help, if needed. Repeat 2 times. Complete this exercise 3 times per week. Exercise B: Pendulum   Stand near a wall or a surface that you can hold onto for balance. Bend at the waist and let your left / right arm hang straight down. Use your other arm to support you. Relax  your arm and shoulder muscles, and move your hips and your trunk so your left / right arm swings freely. Your arm should swing because of the motion of your body, not because you are using your arm or shoulder muscles. Keep moving so your arm swings in the following directions, as told by your health care provider: Side to side. Forward and backward. In clockwise and counterclockwise circles. Slowly return to the starting position. Repeat 2 times. Complete this exercise 3 times per week.  Strengthening exercises These exercises build strength and endurance in your arm and shoulder. Endurance is the ability to use your muscles for a long time, even after your muscles get tired. Exercise C: Elbow flexion, neutral  Sit on a stable chair without armrests, or stand. Hold a 3-5 lb weight in your left / right hand, or hold an exercise band with both hands. Your palms should face each other at the starting position. Bend your left / right elbow and move your hand up toward your shoulder. Lead with your thumb, and keep your palm facing the same direction. Keep your other arm straight down, in the starting position. Slowly return to the starting position. Repeat 2-3 times. Complete this exercise 3 times per week. Exercise D: Forearm supination   Sit with your left / right forearm on a table. Your elbow should be below shoulder height. Rest your hand over the edge of the  table so your palm faces down. If directed, hold a hammer with your left / right hand. Without moving your elbow, slowly rotate your hand so your palm faces up toward the ceiling. If you are holding a hammer, begin by holding the hammer near the head. When this exercise gets easier for you, hold the hammer farther down the handle. Hold for 3 seconds. Slowly return to the starting position. Repeat 2 times. Complete this exercise 3 times per week. Exercise E: Scapular retraction   Sit in a stable chair without armrests, or  stand. Secure an exercise band to a stable object in front of you so the band is at shoulder height. Hold one end of the exercise band in each hand. Squeeze your shoulder blades together and move your elbows slightly behind you. Do not shrug your shoulders. Hold for 3 seconds. Slowly return to the starting position. Repeat 2 times. Complete this exercise 3 times per week. Exercise F: Scapular protraction, supine   Lie on your back on a firm surface. Hold a 3-5 lb weight in your left / right hand. Raise your left / right arm straight into the air so your hand is directly above your shoulder joint. Push the weight into the air so your shoulder lifts off of the surface that you are lying on. Do not move your head, neck, or back. Hold for 3 seconds. Slowly return to the starting position. Let your muscles relax completely before you repeat this exercise. Repeat 2 times. Complete this exercise 3 times per week. This information is not intended to replace advice given to you by your health care provider. Make sure you discuss any questions you have with your health care provider. Document Released: 04/15/2005 Document Revised: 12/21/2015 Document Reviewed: 03/24/2015 Elsevier Interactive Patient Education  2017 [employer].

## 2022-11-05 ENCOUNTER — Ambulatory Visit
Admission: RE | Admit: 2022-11-05 | Discharge: 2022-11-05 | Disposition: A | Discharge status: Home / Self Care | Payer: Medicare HMO | Source: Ambulatory Visit | Attending: Family Medicine | Admitting: Family Medicine

## 2022-11-05 DIAGNOSIS — E349 Endocrine disorder, unspecified: Secondary | ICD-10-CM | POA: Diagnosis not present

## 2022-11-05 DIAGNOSIS — N958 Other specified menopausal and perimenopausal disorders: Secondary | ICD-10-CM | POA: Diagnosis not present

## 2022-11-05 DIAGNOSIS — M069 Rheumatoid arthritis, unspecified: Secondary | ICD-10-CM | POA: Diagnosis not present

## 2022-11-05 DIAGNOSIS — Z853 Personal history of malignant neoplasm of breast: Secondary | ICD-10-CM | POA: Diagnosis not present

## 2022-11-05 DIAGNOSIS — E2839 Other primary ovarian failure: Secondary | ICD-10-CM

## 2022-11-11 ENCOUNTER — Other Ambulatory Visit: Payer: Self-pay | Admitting: Family Medicine

## 2022-11-11 ENCOUNTER — Telehealth: Payer: Self-pay | Admitting: Family Medicine

## 2022-11-11 DIAGNOSIS — H6120 Impacted cerumen, unspecified ear: Secondary | ICD-10-CM

## 2022-11-11 NOTE — Telephone Encounter (Signed)
It's probably the wax. Please refer to ENT to help with this as we were not as successful.

## 2022-11-11 NOTE — Telephone Encounter (Signed)
Referral placed Patient

## 2022-11-11 NOTE — Telephone Encounter (Signed)
Called the patient informed referral done. 

## 2022-11-11 NOTE — Telephone Encounter (Signed)
Patient called and states came last Friday and had wax removed from ear. Patient says still can't hear and is concerned. Appt was offered she states she would rather someone call her. Please call

## 2022-11-12 DIAGNOSIS — Z79899 Other long term (current) drug therapy: Secondary | ICD-10-CM | POA: Diagnosis not present

## 2022-11-20 ENCOUNTER — Encounter: Payer: Self-pay | Admitting: Genetic Counselor

## 2022-12-04 DIAGNOSIS — H6122 Impacted cerumen, left ear: Secondary | ICD-10-CM | POA: Diagnosis not present

## 2022-12-09 ENCOUNTER — Ambulatory Visit
Admission: RE | Admit: 2022-12-09 | Discharge: 2022-12-09 | Disposition: A | Discharge status: Home / Self Care | Payer: Medicare HMO | Source: Ambulatory Visit | Attending: Radiation Oncology | Admitting: Radiation Oncology

## 2022-12-09 NOTE — Progress Notes (Addendum)
  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 (as documented in provider EOT note)  The patient was not available for call today. Voicemail left.  The patient was encouraged to avoid sun exposure in the area of prior treatment for up to one year following radiation with either sunscreen or by the style of clothing worn in the sun.  The patient has scheduled follow up with her medical oncologist Dr. Al Pimple for ongoing surveillance, and was encouraged to call if she develops concerns or questions regarding radiation.    Ruel Favors, LPN

## 2022-12-16 ENCOUNTER — Other Ambulatory Visit (HOSPITAL_BASED_OUTPATIENT_CLINIC_OR_DEPARTMENT_OTHER): Payer: Self-pay

## 2022-12-16 ENCOUNTER — Other Ambulatory Visit: Payer: Self-pay | Admitting: Family Medicine

## 2022-12-16 MED ORDER — ESCITALOPRAM OXALATE 20 MG PO TABS
20.0000 mg | ORAL_TABLET | Freq: Every day | ORAL | 0 refills | Status: DC
  Filled 2022-12-16: qty 30, 30d supply, fill #0

## 2022-12-19 ENCOUNTER — Telehealth: Payer: Self-pay | Admitting: Adult Health

## 2022-12-19 NOTE — Telephone Encounter (Signed)
Left patient a message in regards to rescheduled appointment times/dates due to provider being out of office

## 2023-01-03 ENCOUNTER — Inpatient Hospital Stay: Payer: Medicare HMO | Attending: Radiation Oncology | Admitting: Adult Health

## 2023-01-09 ENCOUNTER — Inpatient Hospital Stay: Payer: Medicare HMO | Attending: Radiation Oncology | Admitting: Adult Health

## 2023-01-09 ENCOUNTER — Encounter: Payer: Self-pay | Admitting: *Deleted

## 2023-01-09 ENCOUNTER — Encounter: Payer: Self-pay | Admitting: Adult Health

## 2023-01-09 VITALS — BP 166/78 | HR 83 | Temp 97.0°F | Resp 18 | Wt 157.4 lb

## 2023-01-09 DIAGNOSIS — Z170 Estrogen receptor positive status [ER+]: Secondary | ICD-10-CM | POA: Insufficient documentation

## 2023-01-09 DIAGNOSIS — Z79811 Long term (current) use of aromatase inhibitors: Secondary | ICD-10-CM | POA: Diagnosis not present

## 2023-01-09 DIAGNOSIS — C50412 Malignant neoplasm of upper-outer quadrant of left female breast: Secondary | ICD-10-CM | POA: Insufficient documentation

## 2023-01-09 DIAGNOSIS — M8588 Other specified disorders of bone density and structure, other site: Secondary | ICD-10-CM | POA: Insufficient documentation

## 2023-01-09 NOTE — Patient Instructions (Signed)
Bone Health Bones protect organs, store calcium, anchor muscles, and support the whole body. Keeping your bones strong is important, especially as you get older. You can take actions to help keep your bones strong and healthy. Why is keeping my bones healthy important?  Keeping your bones healthy is important because your body constantly replaces bone cells. Cells get old, and new cells take their place. As we age, we lose bone cells because the body may not be able to make enough new cells to replace the old cells. The amount of bone cells and bone tissue you have is referred to as bone mass. The higher your bone mass, the stronger your bones. The aging process leads to an overall loss of bone mass in the body, which can increase the likelihood of: Broken bones. A condition in which the bones become weak and brittle (osteoporosis). A large decline in bone mass occurs in older adults. In women, it occurs about the time of menopause. What actions can I take to keep my bones healthy? Good health habits are important for maintaining healthy bones. This includes eating nutritious foods and exercising regularly. To have healthy bones, you need to get enough of the right minerals and vitamins. Most nutrition experts recommend getting these nutrients from the foods that you eat. In some cases, taking supplements may also be recommended. Doing certain types of exercise is also important for bone health. What are the nutritional recommendations for healthy bones?  Eating a well-balanced diet with plenty of calcium and vitamin D will help to protect your bones. Nutritional recommendations vary from person to person. Ask your health care provider what is healthy for you. Here are some general guidelines. Get enough calcium Calcium is the most important (essential) mineral for bone health. Most people can get enough calcium from their diet, but supplements may be recommended for people who are at risk for  osteoporosis. Good sources of calcium include: Dairy products, such as low-fat or nonfat milk, cheese, and yogurt. Dark green leafy vegetables, such as bok choy and broccoli. Foods that have calcium added to them (are fortified). Foods that may be fortified with calcium include orange juice, cereal, bread, soy beverages, and tofu products. Nuts, such as almonds. Follow these recommended amounts for daily calcium intake: Infants, 0-6 months: 200 mg. Infants, 6-12 months: 260 mg. Children, age 1-3: 700 mg. Children, age 4-8: 1,000 mg. Children, age 9-13: 1,300 mg. Teens, age 14-18: 1,300 mg. Adults, age 19-50: 1,000 mg. Adults, age 51-70: Men: 1,000 mg. Women: 1,200 mg. Adults, age 71 or older: 1,200 mg. Pregnant and breastfeeding females: Teens: 1,300 mg. Adults: 1,000 mg. Get enough vitamin D Vitamin D is the most essential vitamin for bone health. It helps the body absorb calcium. Sunlight stimulates the skin to make vitamin D, so be sure to get enough sunlight. If you live in a cold climate or you do not get outside often, your health care provider may recommend that you take vitamin D supplements. Good sources of vitamin D in your diet include: Egg yolks. Saltwater fish. Milk and cereal fortified with vitamin D. Follow these recommended amounts for daily vitamin D intake: Infants, 0-12 months: 400 international units (IU). Children and teens, age 1-18: 600 international units. Adults, age 59 or younger: 600 international units. Adults, age 60 or older: 600-1,000 international units. Get other important nutrients Other nutrients that are important for bone health include: Phosphorus. This mineral is found in meat, poultry, dairy foods, nuts, and legumes. The   recommended daily intake for adult men and adult women is 700 mg. Magnesium. This mineral is found in seeds, nuts, dark green vegetables, and legumes. The recommended daily intake for adult men is 400-420 mg. For adult women,  it is 310-320 mg. Vitamin K. This vitamin is found in green leafy vegetables. The recommended daily intake is 120 mcg for adult men and 90 mcg for adult women. What type of physical activity is best for building and maintaining healthy bones? Weight-bearing and strength-building activities are important for building and maintaining healthy bones. Weight-bearing activities cause muscles and bones to work against gravity. Strength-building activities increase the strength of the muscles that support bones. Weight-bearing and muscle-building activities include: Walking and hiking. Jogging and running. Dancing. Gym exercises. Lifting weights. Tennis and racquetball. Climbing stairs. Aerobics. Adults should get at least 30 minutes of moderate physical activity on most days. Children should get at least 60 minutes of moderate physical activity on most days. Ask your health care provider what type of exercise is best for you. How can I find out if my bone mass is low? Bone mass can be measured with an X-ray test called a bone mineral density (BMD) test. This test is recommended for all women who are age 65 or older. It may also be recommended for: Men who are age 70 or older. People who are at risk for osteoporosis because of: Having a long-term disease that weakens bones, such as kidney disease or rheumatoid arthritis. Having menopause earlier than normal. Taking medicine that weakens bones, such as steroids, thyroid hormones, or hormone treatment for breast cancer or prostate cancer. Smoking. Drinking three or more alcoholic drinks a day. Being underweight. Sedentary lifestyle. If you find that you have a low bone mass, you may be able to prevent osteoporosis or further bone loss by changing your diet and lifestyle. Where can I find more information? Bone Health & Osteoporosis Foundation: www.nof.org/patients National Institutes of Health: www.bones.nih.gov International Osteoporosis  Foundation: www.iofbonehealth.org Summary The aging process leads to an overall loss of bone mass in the body, which can increase the likelihood of broken bones and osteoporosis. Eating a well-balanced diet with plenty of calcium and vitamin D will help to protect your bones. Weight-bearing and strength-building activities are also important for building and maintaining strong bones. Bone mass can be measured with an X-ray test called a bone mineral density (BMD) test. This information is not intended to replace advice given to you by your health care provider. Make sure you discuss any questions you have with your health care provider. Document Revised: 09/27/2020 Document Reviewed: 09/27/2020 Elsevier Patient Education  2024 Elsevier Inc.  

## 2023-01-13 ENCOUNTER — Telehealth: Payer: Self-pay | Admitting: Hematology and Oncology

## 2023-01-13 NOTE — Telephone Encounter (Signed)
Left patient message regarding scheduled appointment times/dates; left callback number if needed for rescheduled

## 2023-01-13 NOTE — Progress Notes (Signed)
SURVIVORSHIP VISIT:  BRIEF ONCOLOGIC HISTORY:  Oncology History  Malignant neoplasm of upper-outer quadrant of left breast in female, estrogen receptor positive (HCC)  06/11/2022 Mammogram   Bilateral diagnostic mammogram showed suspicious left breast mass at 1 o'clock position.  Ultrasound-guided biopsy was recommended.  There were also indeterminate left breast calcs recommend stereotactic biopsy.  No suspicious left axillary adenopathy.  Right breast normal   06/20/2022 Pathology Results   Pathology results showed grade 2 invasive ductal carcinoma prognostics from the first specimen at 1:00 10 cm from the nipple showed ER 100% positive strong staining PR 20% positive moderate to strong staining and Ki-67 of 10%, tumor cells negative for HER2 1+.  Second biopsy which was from upper outer quadrant coil shaped clip also showed invasive ductal carcinoma grade 2 prognostics for this 1 showed ER 95% positive strong staining PR 0% negative Ki-67 of 15% and HER2 2+ by IHC and FISH negative   06/25/2022 Initial Diagnosis   Malignant neoplasm of upper-outer quadrant of left breast in female, estrogen receptor positive (HCC)   06/26/2022 Cancer Staging   Staging form: Breast, AJCC 8th Edition - Clinical stage from 06/26/2022: Stage IA (cT1b, cN0, cM0, G2, ER+, PR+, HER2-) - Signed by Ronny Bacon, PA-C on 06/26/2022 Stage prefix: Initial diagnosis Method of lymph node assessment: Clinical Histologic grading system: 3 grade system    Genetic Testing   [facility] Custom Panel+RNA was Negative. 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 Report date was 07/04/2022.  The Custom Hereditary Cancers Panel offered by [facility] includes sequencing and/or deletion duplication testing of the following 43 genes: APC, ATM, AXIN2, BAP1, BARD1, BMPR1A, BRCA1, BRCA2, BRIP1, CDH1, CDK4, CDKN2A (p14ARF and p16INK4a only), CHEK2, CTNNA1, EPCAM (Deletion/duplication testing only),  FH, GREM1 (promoter region duplication testing only), HOXB13, KIT, MBD4, MEN1, MLH1, MSH2, MSH3, MSH6, MUTYH, NF1, NHTL1, PALB2, PDGFRA, PMS2, POLD1, POLE, PTEN, RAD51C, RAD51D, SMAD4, SMARCA4. STK11, TP53, TSC1, TSC2, and VHL.  UPDATE: NF1 c.5668A>G (p.[id number]) VUS has been amended to Likely Benign. Amended report date is 08/19/2022.   09/09/2022 - 10/07/2022 Radiation Therapy   Plan Name: Breast_L_BH Site: Breast, Left Technique: 3D Mode: Photon Dose Per Fraction: 2.66 Gy Prescribed Dose (Delivered / Prescribed): 42.56 Gy / 42.56 Gy Prescribed Fxs (Delivered / Prescribed): 16 / 16   Plan Name: Brst_L_Bst_BH Site: Breast, Left Technique: 3D Mode: Photon Dose Per Fraction: 2 Gy Prescribed Dose (Delivered / Prescribed): 8 Gy / 8 Gy Prescribed Fxs (Delivered / Prescribed): 4 / 4   10/2022 -  Anti-estrogen oral therapy   Anastrozole     INTERVAL HISTORY:  Linda Wood to review her survivorship care plan detailing her treatment course for breast cancer, as well as monitoring long-term side effects of that treatment, education regarding health maintenance, screening, and overall wellness and health promotion.     Overall, Linda Wood reports feeling quite well.  She is taking anastrozole daily.  She notes some calf cramping and is drinking a tumbler of water a day.  She is experiencing night sweats as well.  REVIEW OF SYSTEMS:  Review of Systems  Constitutional:  Negative for appetite change, chills, fatigue, fever and unexpected weight change.  HENT:   Negative for hearing loss, lump/mass and trouble swallowing.   Eyes:  Negative for eye problems and icterus.  Respiratory:  Negative for chest tightness, cough and shortness of breath.   Cardiovascular:  Negative for chest pain, leg swelling and palpitations.  Gastrointestinal:  Negative  for abdominal distention, abdominal pain, constipation, diarrhea, nausea and vomiting.  Endocrine: Negative for hot flashes.  Genitourinary:   Negative for difficulty urinating.   Musculoskeletal:  Negative for arthralgias.  Skin:  Negative for itching and rash.  Neurological:  Negative for dizziness, extremity weakness, headaches and numbness.  Hematological:  Negative for adenopathy. Does not bruise/bleed easily.  Psychiatric/Behavioral:  Negative for depression. The patient is not nervous/anxious.    Breast: Denies any new nodularity, masses, tenderness, nipple changes, or nipple discharge.       PAST MEDICAL/SURGICAL HISTORY:  Past Medical History:  Diagnosis Date   Anxiety    Breast cancer (HCC)    Depression    Hypertension    Rheumatoid arthritis (HCC)    Past Surgical History:  Procedure Laterality Date   BREAST BIOPSY Left 06/20/2022   [place] LT BREAST BX W LOC DEV 1ST LESION IMG BX SPEC US GUIDE 06/20/2022 [facility]   BREAST BIOPSY Left 06/20/2022   MM LT BREAST BX W LOC DEV 1ST LESION IMAGE BX SPEC STEREO GUIDE 06/20/2022 [facility]   BREAST BIOPSY  07/15/2022   MM LT RADIOACTIVE SEED LOC MAMMO GUIDE 07/15/2022 [facility]   BREAST BIOPSY  07/15/2022   MM LT RADIOACTIVE SEED EA ADD LESION LOC MAMMO GUIDE 07/15/2022 [facility]   BREAST LUMPECTOMY WITH RADIOACTIVE SEED AND SENTINEL LYMPH NODE BIOPSY Left 07/16/2022   Procedure: LEFT BREAST LUMPECTOMY WITH RADIOACTIVE SEED X2 AND AXILLARY SENTINEL LYMPH NODE BIOPSY;  Surgeon: Manus Rudd, MD;  Location: [facility] OR;  Service: General;  Laterality: Left;   CATARACT EXTRACTION, BILATERAL     08/14/2022, 08/28/2022   GASTRIC BYPASS     HAND SURGERY Right    LASIK     RE-EXCISION OF BREAST LUMPECTOMY Left 07/24/2022   Procedure: RE-EXCISION OF LEFT BREAST LUMPECTOMY ANTERIOR AND LATERAL MARGINS;  Surgeon: Manus Rudd, MD;  Location: [facility] OR;  Service: General;  Laterality: Left;  45 MIN ROOM 12     ALLERGIES:  Allergies  Allergen Reactions   Dextromethorphan Anaphylaxis   Dextromethorphan-Guaifenesin Other (See Comments)   Promethazine      Dizziness      CURRENT MEDICATIONS:  Outpatient Encounter Medications as of 01/09/2023  Medication Sig   anastrozole (ARIMIDEX) 1 MG tablet Take 1 tablet (1 mg total) by mouth daily.   diphenhydrAMINE (BENADRYL) 25 MG tablet Take 25-50 mg by mouth at bedtime as needed for allergies.   ergocalciferol (VITAMIN D2) 1.25 MG (50000 UT) capsule Take 50,000 Units by mouth every Saturday.   escitalopram (LEXAPRO) 20 MG tablet Take 1 tablet (20 mg total) by mouth daily.   fluticasone (FLONASE) 50 MCG/ACT nasal spray Place 1 spray into both nostrils daily as needed for allergies or rhinitis.   hydroxychloroquine (PLAQUENIL) 200 MG tablet TAKE 1 TABLET (200 MG) BY MOUTH IN THE MORNING   loperamide (IMODIUM) 2 MG capsule Take 4 mg by mouth as needed for diarrhea or loose stools.   losartan (COZAAR) 50 MG tablet Take 1 tablet (50 mg total) by mouth in the morning.   Multiple Vitamin (MULTIVITAMIN WITH MINERALS) TABS tablet Take 2 tablets by mouth daily.   traZODone (DESYREL) 50 MG tablet Take 0.5-1 tablets (25-50 mg total) by mouth at bedtime as needed for sleep. (Patient taking differently: Take 50 mg by mouth at bedtime.)   No facility-administered encounter medications on file as of 01/09/2023.     ONCOLOGIC FAMILY HISTORY:  Family History  Problem Relation Age of Onset  Heart failure Mother    Breast cancer Mother 53     SOCIAL HISTORY:  Social History   Socioeconomic History   Marital status: Married    Spouse name: [name]   Number of children: Not on file   Years of education: Not on file   Highest education level: Not on file  Occupational History   Not on file  Tobacco Use   Smoking status: Never    Passive exposure: Never   Smokeless tobacco: Never  Vaping Use   Vaping status: Never Used  Substance and Sexual Activity   Alcohol use: Yes    Alcohol/week: 3.0 standard drinks of alcohol    Types: 3 Glasses of wine per week    Comment: wine 3 times per week   Drug use:  Never   Sexual activity: Yes    Partners: Male  Other Topics Concern   Not on file  Social History Narrative   Not on file   Social Determinants of Health   Financial Resource Strain: Low Risk  (06/26/2022)   Overall Financial Resource Strain (CARDIA)    Difficulty of Paying Living Expenses: Not very hard  Food Insecurity: No Food Insecurity (06/26/2022)   Hunger Vital Sign    Worried About Running Out of Food in the Last Year: Never true    Ran Out of Food in the Last Year: Never true  Transportation Needs: No Transportation Needs (06/26/2022)   PRAPARE - Administrator, Civil Service (Medical): No    Lack of Transportation (Non-Medical): No  Physical Activity: Not on file  Stress: Not on file  Social Connections: Not on file  Intimate Partner Violence: Not on file     OBSERVATIONS/OBJECTIVE:  BP (!) 166/78 (BP Location: Left Arm, Patient Position: Sitting)   Pulse 83   Temp (!) 97 F (36.1 C) (Temporal)   Resp 18   Wt 157 lb 6 oz (71.4 kg)   SpO2 98%   BMI 28.78 kg/m  GENERAL: Patient is a well appearing female in no acute distress HEENT:  Sclerae anicteric.  Oropharynx clear and moist. No ulcerations or evidence of oropharyngeal candidiasis. Neck is supple.  NODES:  No cervical, supraclavicular, or axillary lymphadenopathy palpated.  BREAST EXAM: Left breast status postlumpectomy and radiation no sign of local recurrence right breast is benign. LUNGS:  Clear to auscultation bilaterally.  No wheezes or rhonchi. HEART:  Regular rate and rhythm. No murmur appreciated. ABDOMEN:  Soft, nontender.  Positive, normoactive bowel sounds. No organomegaly palpated. MSK:  No focal spinal tenderness to palpation. Full range of motion bilaterally in the upper extremities. EXTREMITIES:  No peripheral edema.   SKIN:  Clear with no obvious rashes or skin changes. No nail dyscrasia. NEURO:  Nonfocal. Well oriented.  Appropriate affect.   LABORATORY DATA:  None for this  visit.  DIAGNOSTIC IMAGING:  None for this visit.   ASSESSMENT AND PLAN:  Ms.. Wood is a pleasant 67 y.o. female with Stage IA left breast invasive ductal carcinoma, ER+/PR+/HER2-, diagnosed in 05/2022, treated with lumpectomy, adjuvant radiation therapy, and anti-estrogen therapy with Anastrozole beginning in 10/2022.  She presents to the Survivorship [facility] for our initial meeting and routine follow-up post-completion of treatment for breast cancer.    1. Stage IA left breast cancer:  Linda Wood is continuing to recover from definitive treatment for breast cancer. She will follow-up with her medical oncologist, Dr.  Al Pimple in 6 months with history and physical exam per surveillance protocol.  She will continue her anti-estrogen therapy with Anastrozole. Thus far, she is tolerating the Anastrozole well, with minimal side effects. Her mammogram is due 05/2023; orders placed today.   Today, a comprehensive survivorship care plan and treatment summary was reviewed with the patient today detailing her breast cancer diagnosis, treatment course, potential late/long-term effects of treatment, appropriate follow-up care with recommendations for the future, and patient education resources.  A copy of this summary, along with a letter will be sent to the patient's primary care provider via mail/fax/In Basket message after today's visit.    2. Bone health:  Given Linda Wood's age/history of breast cancer and her current treatment regimen including anti-estrogen therapy with Anastrozole, she is at risk for bone demineralization.  Her most recent bone density testing occurred in July 2024 and demonstrated osteopenia with a T-score -2.2 in the spine.  I gave her a handout on bone health as some of the supplements for bone health can also help with cramping in her legs.  She was given education on specific activities to promote bone health.  3. Cancer screening:  Due to Linda Wood's history and her age, she should  receive screening for skin cancers, colon cancer, and gynecologic cancers.  The information and recommendations are listed on the patient's comprehensive care plan/treatment summary and were reviewed in detail with the patient.    4. Health maintenance and wellness promotion: Linda Wood was encouraged to consume 5-7 servings of fruits and vegetables per day. We reviewed the "Nutrition Rainbow" handout.  She was also encouraged to engage in moderate to vigorous exercise for 30 minutes per day most days of the week.  She was instructed to limit her alcohol consumption and continue to abstain from tobacco use.     5. Support services/counseling: It is not uncommon for this period of the patient's cancer care trajectory to be one of many emotions and stressors.   She was given information regarding our available services and encouraged to contact me with any questions or for help enrolling in any of our support group/programs.    Follow up instructions:    -Return to cancer center in 6 months for follow-up with Dr. Al Pimple -Mammogram due in 05/2023 -She is welcome to return back to the Survivorship Clinic at any time; no additional follow-up needed at this time.  -Consider referral back to survivorship as a long-term survivor for continued surveillance  The patient was provided an opportunity to ask questions and all were answered. The patient agreed with the plan and demonstrated an understanding of the instructions.   Total encounter time:30 minutes*in face-to-face visit time, chart review, lab review, care coordination, order entry, and documentation of the encounter time.    Lillard Anes, NP 01/13/23 4:39 PM Medical Oncology and Hematology [facility] [address] [place], [place] [address] Tel. [phone]    Fax. [phone]  *Total Encounter Time as defined by the Centers for Medicare and Medicaid Services includes, in addition to the face-to-face time of a patient  visit (documented in the note above) non-face-to-face time: obtaining and reviewing outside history, ordering and reviewing medications, tests or procedures, care coordination (communications with other health care professionals or caregivers) and documentation in the medical record.

## 2023-02-10 ENCOUNTER — Ambulatory Visit: Payer: Medicare HMO | Attending: Surgery

## 2023-02-10 VITALS — Wt 157.2 lb

## 2023-02-10 DIAGNOSIS — Z483 Aftercare following surgery for neoplasm: Secondary | ICD-10-CM | POA: Insufficient documentation

## 2023-02-10 NOTE — Therapy (Signed)
OUTPATIENT PHYSICAL THERAPY SOZO SCREENING NOTE   Patient Name: Linda Wood MRN: [id number] DOB:1955/10/09, 67 y.o., female Today's Date: 02/10/2023  PCP: [name], DO REFERRING PROVIDER: Manus Rudd, MD   PT End of Session - 02/10/23 1553     Visit Number 2   # unchanged due to screen only   PT Start Time 1551    PT Stop Time 1555    PT Time Calculation (min) 4 min    Activity Tolerance Patient tolerated treatment well    Behavior During Therapy WFL for tasks assessed/performed             Past Medical History:  Diagnosis Date   Anxiety    Breast cancer (HCC)    Depression    Hypertension    Rheumatoid arthritis (HCC)    Past Surgical History:  Procedure Laterality Date   BREAST BIOPSY Left 06/20/2022   [place] LT BREAST BX W LOC DEV 1ST LESION IMG BX SPEC US GUIDE 06/20/2022 [facility]   BREAST BIOPSY Left 06/20/2022   MM LT BREAST BX W LOC DEV 1ST LESION IMAGE BX SPEC STEREO GUIDE 06/20/2022 [facility]   BREAST BIOPSY  07/15/2022   MM LT RADIOACTIVE SEED LOC MAMMO GUIDE 07/15/2022 [facility]   BREAST BIOPSY  07/15/2022   MM LT RADIOACTIVE SEED EA ADD LESION LOC MAMMO GUIDE 07/15/2022 [facility]   BREAST LUMPECTOMY WITH RADIOACTIVE SEED AND SENTINEL LYMPH NODE BIOPSY Left 07/16/2022   Procedure: LEFT BREAST LUMPECTOMY WITH RADIOACTIVE SEED X2 AND AXILLARY SENTINEL LYMPH NODE BIOPSY;  Surgeon: Manus Rudd, MD;  Location: [facility];  Service: General;  Laterality: Left;   CATARACT EXTRACTION, BILATERAL     08/14/2022, 08/28/2022   GASTRIC BYPASS     HAND SURGERY Right    LASIK     RE-EXCISION OF BREAST LUMPECTOMY Left 07/24/2022   Procedure: RE-EXCISION OF LEFT BREAST LUMPECTOMY ANTERIOR AND LATERAL MARGINS;  Surgeon: Manus Rudd, MD;  Location: [facility];  Service: General;  Laterality: Left;  45 MIN ROOM 12   Patient Active Problem List   Diagnosis Date Noted   Genetic testing 07/05/2022   Family history of breast  cancer 06/27/2022   Malignant neoplasm of upper-outer quadrant of left breast in female, estrogen receptor positive (HCC) 06/25/2022   Psychophysiological insomnia 11/28/2021   Rheumatoid arthritis (HCC) 10/29/2021   Essential hypertension 10/29/2021   Depression, major, single episode, complete remission (HCC) 10/29/2021   Rib fractures 10/15/2021    REFERRING DIAG: left breast cancer at risk for lymphedema  THERAPY DIAG:  Aftercare following surgery for neoplasm  PERTINENT HISTORY: Patient was diagnosed on 06/20/2022 with left grade 2 invasive ductal carcinoma breast cancer. It measures 7 mm and is located in the upper outer quadrant. It is ER/PR positive and HER2 negative with a Ki67 of 10% . She had a double  left lumpectomy with SLNB on 07/16/2022, followed by a re-excision on 07/24/2022 to get clear margins   PRECAUTIONS: left UE Lymphedema risk, None  SUBJECTIVE: Pt returns for her 3 month L-Dex screen.   PAIN:  Are you having pain? No  SOZO SCREENING: Patient was assessed today using the SOZO machine to determine the lymphedema index score. This was compared to her baseline score. It was determined that she is within the recommended range when compared to her baseline and no further action is needed at this time. She will continue SOZO screenings. These are done every 3 months for 2 years post operatively followed by  every 6 months for 2 years, and then annually.   L-DEX FLOWSHEETS - 02/10/23 1500       L-DEX LYMPHEDEMA SCREENING   Measurement Type Unilateral    L-DEX MEASUREMENT EXTREMITY Upper Extremity    DOMINANT SIDE Right    At Risk Side Left    BASELINE SCORE (UNILATERAL) 8.2    L-DEX SCORE (UNILATERAL) 7.2    VALUE CHANGE (UNILAT) -1               Hermenia Bers, PTA 02/10/2023, 3:54 PM

## 2023-03-13 NOTE — Progress Notes (Signed)
Office Visit Note  Patient: Linda Wood             Date of Birth: Feb 23, 1956           MRN: [id number]             PCP: [name], DO Referring: [name]* Visit Date: 03/20/2023 Occupation: @GUAROCC @  Subjective:  Medication management  History of Present Illness: Linda Wood is a 67 y.o. female with seropositive rheumatoid arthritis and osteoarthritis.  She returns today after her last visit in June 2024.  She states that she has been taking hydroxychloroquine 200 mg p.o. daily without any interruption.  She has not had a flare of rheumatoid arthritis.  She has some stiffness in her knee joints after prolonged sitting.  She experienced some discomfort in her hands with the weather change which is better now.  She has intermittent discomfort in her lower back.  Patient states she has an appointment with the ophthalmologist in January.    Activities of Daily Living:  Patient reports morning stiffness for 10  minutes.   Patient Denies nocturnal pain.  Difficulty dressing/grooming: Denies Difficulty climbing stairs: Denies Difficulty getting out of chair: Denies Difficulty using hands for taps, buttons, cutlery, and/or writing: Denies  Review of Systems  Constitutional:  Negative for fatigue.  HENT:  Negative for mouth dryness.   Eyes:  Negative for dryness.  Respiratory:  Negative for shortness of breath.   Cardiovascular:  Negative for chest pain and palpitations.  Gastrointestinal:  Positive for diarrhea. Negative for blood in stool and constipation.  Endocrine: Negative for increased urination.  Genitourinary:  Negative for decreased urine output.  Musculoskeletal:  Positive for joint pain, joint pain and morning stiffness. Negative for joint swelling, myalgias and myalgias.  Skin:  Negative for color change, rash and sensitivity to sunlight.  Allergic/Immunologic: Negative for susceptible to infections.  Neurological:  Negative for  headaches.  Psychiatric/Behavioral:  Positive for depressed mood. Negative for sleep disturbance. The patient is nervous/anxious.     PMFS History:  Patient Active Problem List   Diagnosis Date Noted   Genetic testing 07/05/2022   Family history of breast cancer 06/27/2022   Malignant neoplasm of upper-outer quadrant of left breast in female, estrogen receptor positive (HCC) 06/25/2022   Psychophysiological insomnia 11/28/2021   Rheumatoid arthritis (HCC) 10/29/2021   Essential hypertension 10/29/2021   Depression, major, single episode, complete remission (HCC) 10/29/2021   Rib fractures 10/15/2021    Past Medical History:  Diagnosis Date   Anxiety    Breast cancer (HCC)    Depression    Hypertension    Rheumatoid arthritis (HCC)     Family History  Problem Relation Age of Onset   Heart failure Mother    Breast cancer Mother 4   Past Surgical History:  Procedure Laterality Date   BREAST BIOPSY Left 06/20/2022   [place] LT BREAST BX W LOC DEV 1ST LESION IMG BX SPEC US GUIDE 06/20/2022 [facility]   BREAST BIOPSY Left 06/20/2022   MM LT BREAST BX W LOC DEV 1ST LESION IMAGE BX SPEC STEREO GUIDE 06/20/2022 [facility]   BREAST BIOPSY  07/15/2022   MM LT RADIOACTIVE SEED LOC MAMMO GUIDE 07/15/2022 [facility]   BREAST BIOPSY  07/15/2022   MM LT RADIOACTIVE SEED EA ADD LESION LOC MAMMO GUIDE 07/15/2022 [facility]   BREAST LUMPECTOMY WITH RADIOACTIVE SEED AND SENTINEL LYMPH NODE BIOPSY Left 07/16/2022   Procedure: LEFT BREAST LUMPECTOMY WITH RADIOACTIVE SEED  X2 AND AXILLARY SENTINEL LYMPH NODE BIOPSY;  Surgeon: Manus Rudd, MD;  Location: [facility];  Service: General;  Laterality: Left;   CATARACT EXTRACTION, BILATERAL     08/14/2022, 08/28/2022   GASTRIC BYPASS     HAND SURGERY Right    LASIK     RE-EXCISION OF BREAST LUMPECTOMY Left 07/24/2022   Procedure: RE-EXCISION OF LEFT BREAST LUMPECTOMY ANTERIOR AND LATERAL MARGINS;  Surgeon: Manus Rudd, MD;   Location: [facility];  Service: General;  Laterality: Left;  45 MIN ROOM 12   Social History   Social History Narrative   Not on file   Immunization History  Administered Date(s) Administered   Fluad Quad(high Dose 65+) 05/02/2020, 05/03/2022   Influenza, Quadrivalent, Recombinant, Inj, Pf 05/21/2017   Influenza,inj,Quad PF,6+ Mos 03/17/2015   Moderna Sars-Covid-2 Vaccination 05/02/2020   PFIZER(Purple Top)SARS-COV-2 Vaccination 07/11/2019, 08/01/2019   PNEUMOCOCCAL CONJUGATE-20 10/04/2020   Tdap 11/03/2021   Zoster Recombinant(Shingrix) 10/06/2017, 12/07/2019, 02/25/2020     Objective: Vital Signs: BP (!) 156/83 (BP Location: Right Arm, Patient Position: Sitting, Cuff Size: Normal)   Pulse 92   Resp 13   Ht 5' 0.5" (1.537 m)   Wt 159 lb 12.8 oz (72.5 kg)   BMI 30.70 kg/m    Physical Exam Vitals and nursing note reviewed.  Constitutional:      Appearance: She is well-developed.  HENT:     Head: Normocephalic and atraumatic.  Eyes:     Conjunctiva/sclera: Conjunctivae normal.  Cardiovascular:     Rate and Rhythm: Normal rate and regular rhythm.     Heart sounds: Normal heart sounds.  Pulmonary:     Effort: Pulmonary effort is normal.     Breath sounds: Normal breath sounds.  Abdominal:     General: Bowel sounds are normal.     Palpations: Abdomen is soft.  Musculoskeletal:     Cervical back: Normal range of motion.  Lymphadenopathy:     Cervical: No cervical adenopathy.  Skin:    General: Skin is warm and dry.     Capillary Refill: Capillary refill takes less than 2 seconds.  Neurological:     Mental Status: She is alert and oriented to person, place, and time.  Psychiatric:        Behavior: Behavior normal.      Musculoskeletal Exam: She had good range of motion of the cervical spine.  Thoracic kyphosis was noted.  There was no tenderness over thoracic or lumbar spine.  Shoulders, elbows, wrist joints, MCPs PIPs and DIPs with good range of motion with no  synovitis.  Hip joints and knee joints with good range of motion without any warmth swelling or effusion.  There was no tenderness over ankles or MTPs.  CDAI Exam: CDAI Score: -- Patient Global: 2 / 100; Provider Global: 2 / 100 Swollen: --; Tender: -- Joint Exam 03/20/2023   No joint exam has been documented for this visit   There is currently no information documented on the homunculus. Go to the Rheumatology activity and complete the homunculus joint exam.  Investigation: No additional findings.  Imaging: No results found.  Recent Labs: Lab Results  Component Value Date   WBC 6.6 06/26/2022   HGB 12.8 06/26/2022   PLT 320 06/26/2022   NA 141 06/26/2022   K 4.1 06/26/2022   CL 107 06/26/2022   CO2 29 06/26/2022   GLUCOSE 66 (L) 06/26/2022   BUN 15 06/26/2022   CREATININE 0.59 06/26/2022   BILITOT 0.3 06/26/2022  ALKPHOS 73 06/26/2022   AST 13 (L) 06/26/2022   ALT 10 06/26/2022   PROT 6.1 (L) 06/26/2022   ALBUMIN 3.8 06/26/2022   CALCIUM 9.8 06/26/2022    Speciality Comments: Hydroxychloroquine 200 mg p.o. daily since 2011.  Eye examination December 2023 by Dr. Elmer Picker per patient  Procedures:  No procedures performed Allergies: Dextromethorphan, Dextromethorphan-guaifenesin, and Promethazine   Assessment / Plan:     Visit Diagnoses: Rheumatoid arthritis involving multiple sites, unspecified whether rheumatoid factor present (HCC) - +RF, +anti-CCP. Dxd in [place] 2000.Ttd with PLQ 200 mg po qd. patient continues to take Plaquenil 200 mg a day.  She has not had a flare of rheumatoid arthritis.  She states she had some stiffness in her hands with the weather change which is improved.  She has stiffness in her knees after prolonged sitting.  She denies any history of joint inflammation.  High risk medication use - Hydroxychloroquine 200 mg p.o. daily since 2011.  Eye examination December 2023 by Dr. Elmer Picker per patient.  Patient has an appointment coming up in January.   She has not had labs since June 26, 2022.  Getting labs every 6 months was emphasized.  Patient refused to have labs in the office today.  She stated that she will like to get labs with Dr. Carmelia Roller.  Information on immunization was placed in the AVS.  Primary osteoarthritis of both hands -no synovitis noted on the examination today.  History of discomfort in bilateral hands.   X-rays obtained in the past were consistent with osteoarthritis.  Primary osteoarthritis of both knees -she has stiffness in her knee joints in the morning and also after prolonged sitting.  X-rays obtained in the past showed moderate chondromalacia patella and moderate osteoarthritis.  Pain in both feet - History of pain in bilateral feet.  No swelling was noted.  X-rays obtained in the past showed osteoarthritic changes.  Postural kyphosis of thoracic region  Essential hypertension-blood pressure was elevated at 156/83.  She was advised to monitor blood pressure closely and follow-up with her PCP.  Psychophysiological insomnia  Depression, major, single episode, complete remission (HCC)  Osteopenia of multiple sites - November 05, 2022 T score -2.2.  Patient is not taking any treatment.  Calcium rich diet with vitamin D was advised.  Malignant neoplasm of upper-outer quadrant of left breast in female, estrogen receptor positive (HCC) - dxd 02/24 lumpectomy, s/p RTX, no CTX ttd by Dr. Al Pimple.  She is currently on Arimidex.  Orders: No orders of the defined types were placed in this encounter.  No orders of the defined types were placed in this encounter.    Follow-Up Instructions: Return in about 6 months (around 09/17/2023) for Rheumatoid arthritis, Osteoarthritis.   Pollyann Savoy, MD  Note - This record has been created using Animal nutritionist.  Chart creation errors have been sought, but may not always  have been located. Such creation errors do not reflect on  the standard of medical care.

## 2023-03-20 ENCOUNTER — Other Ambulatory Visit: Payer: Self-pay | Admitting: Family Medicine

## 2023-03-20 ENCOUNTER — Ambulatory Visit: Payer: Medicare HMO | Attending: Rheumatology | Admitting: Rheumatology

## 2023-03-20 ENCOUNTER — Encounter: Payer: Self-pay | Admitting: Rheumatology

## 2023-03-20 VITALS — BP 156/83 | HR 92 | Resp 13 | Ht 60.5 in | Wt 159.8 lb

## 2023-03-20 DIAGNOSIS — C50412 Malignant neoplasm of upper-outer quadrant of left female breast: Secondary | ICD-10-CM

## 2023-03-20 DIAGNOSIS — M069 Rheumatoid arthritis, unspecified: Secondary | ICD-10-CM

## 2023-03-20 DIAGNOSIS — M170 Bilateral primary osteoarthritis of knee: Secondary | ICD-10-CM | POA: Diagnosis not present

## 2023-03-20 DIAGNOSIS — Z79899 Other long term (current) drug therapy: Secondary | ICD-10-CM

## 2023-03-20 DIAGNOSIS — M4004 Postural kyphosis, thoracic region: Secondary | ICD-10-CM | POA: Diagnosis not present

## 2023-03-20 DIAGNOSIS — M19041 Primary osteoarthritis, right hand: Secondary | ICD-10-CM | POA: Diagnosis not present

## 2023-03-20 DIAGNOSIS — F325 Major depressive disorder, single episode, in full remission: Secondary | ICD-10-CM

## 2023-03-20 DIAGNOSIS — M8589 Other specified disorders of bone density and structure, multiple sites: Secondary | ICD-10-CM

## 2023-03-20 DIAGNOSIS — I10 Essential (primary) hypertension: Secondary | ICD-10-CM | POA: Diagnosis not present

## 2023-03-20 DIAGNOSIS — Z170 Estrogen receptor positive status [ER+]: Secondary | ICD-10-CM

## 2023-03-20 DIAGNOSIS — Z13820 Encounter for screening for osteoporosis: Secondary | ICD-10-CM

## 2023-03-20 DIAGNOSIS — M79671 Pain in right foot: Secondary | ICD-10-CM | POA: Diagnosis not present

## 2023-03-20 DIAGNOSIS — F5104 Psychophysiologic insomnia: Secondary | ICD-10-CM

## 2023-03-20 DIAGNOSIS — M79672 Pain in left foot: Secondary | ICD-10-CM

## 2023-03-20 DIAGNOSIS — M19042 Primary osteoarthritis, left hand: Secondary | ICD-10-CM

## 2023-03-20 NOTE — Patient Instructions (Signed)
Standing Labs We placed an order today for your standing lab work.   Please have your standing labs drawn in November and every 6 months  Please have your labs drawn 2 weeks prior to your appointment so that the provider can discuss your lab results at your appointment, if possible.  Please note that you may see your imaging and lab results in MyChart before we have reviewed them. We will contact you once all results are reviewed. Please allow our office up to 72 hours to thoroughly review all of the results before contacting the office for clarification of your results.  WALK-IN LAB HOURS  Monday through Thursday from 8:00 am -12:30 pm and 1:00 pm-5:00 pm and Friday from 8:00 am-12:00 pm.  Patients with office visits requiring labs will be seen before walk-in labs.  You may encounter longer than normal wait times. Please allow additional time. Wait times may be shorter on  Monday and Thursday afternoons.  We do not book appointments for walk-in labs. We appreciate your patience and understanding with our staff.   Labs are drawn by [facility]. Please bring your co-pay at the time of your lab draw.  You may receive a bill from [facility] for your lab work.  Please note if you are on Hydroxychloroquine and and an order has been placed for a Hydroxychloroquine level,  you will need to have it drawn 4 hours or more after your last dose.  If you wish to have your labs drawn at another location, please call the office 24 hours in advance so we can fax the orders.  The office is located at [address], [place], [place] [address]   If you have any questions regarding directions or hours of operation,  please call [phone].   As a reminder, please drink plenty of water prior to coming for your lab work. Thanks!   Vaccines You are taking a medication(s) that can suppress your immune system.  The following immunizations are recommended: Flu annually Covid-19  Td/Tdap (tetanus,  diphtheria, pertussis) every 10 years Pneumonia (Prevnar 15 then Pneumovax 23 at least 1 year apart.  Alternatively, can take Prevnar 20 without needing additional dose) Shingrix: 2 doses from 4 weeks to 6 months apart  Please check with your PCP to make sure you are up to date.

## 2023-03-21 ENCOUNTER — Other Ambulatory Visit: Payer: Self-pay | Admitting: Family Medicine

## 2023-03-21 ENCOUNTER — Other Ambulatory Visit (HOSPITAL_BASED_OUTPATIENT_CLINIC_OR_DEPARTMENT_OTHER): Payer: Self-pay

## 2023-03-21 ENCOUNTER — Other Ambulatory Visit: Payer: Self-pay

## 2023-03-21 DIAGNOSIS — F5104 Psychophysiologic insomnia: Secondary | ICD-10-CM

## 2023-03-21 MED ORDER — ESCITALOPRAM OXALATE 20 MG PO TABS
20.0000 mg | ORAL_TABLET | Freq: Every day | ORAL | 0 refills | Status: DC
  Filled 2023-03-21: qty 30, 30d supply, fill #0

## 2023-03-21 MED ORDER — TRAZODONE HCL 50 MG PO TABS
25.0000 mg | ORAL_TABLET | Freq: Every evening | ORAL | 0 refills | Status: DC | PRN
Start: 2023-03-21 — End: 2023-06-19
  Filled 2023-03-21: qty 90, 90d supply, fill #0

## 2023-03-21 MED ORDER — LOSARTAN POTASSIUM 50 MG PO TABS
50.0000 mg | ORAL_TABLET | Freq: Every morning | ORAL | 1 refills | Status: DC
Start: 2023-03-21 — End: 2023-06-19
  Filled 2023-03-21: qty 30, 30d supply, fill #0
  Filled 2023-05-04: qty 30, 30d supply, fill #1

## 2023-05-01 DIAGNOSIS — C50919 Malignant neoplasm of unspecified site of unspecified female breast: Secondary | ICD-10-CM | POA: Diagnosis not present

## 2023-05-01 DIAGNOSIS — H179 Unspecified corneal scar and opacity: Secondary | ICD-10-CM | POA: Diagnosis not present

## 2023-05-01 DIAGNOSIS — Z79899 Other long term (current) drug therapy: Secondary | ICD-10-CM | POA: Diagnosis not present

## 2023-05-04 ENCOUNTER — Other Ambulatory Visit: Payer: Self-pay | Admitting: Hematology and Oncology

## 2023-05-05 ENCOUNTER — Other Ambulatory Visit (HOSPITAL_BASED_OUTPATIENT_CLINIC_OR_DEPARTMENT_OTHER): Payer: Self-pay

## 2023-05-05 MED ORDER — ANASTROZOLE 1 MG PO TABS
1.0000 mg | ORAL_TABLET | Freq: Every day | ORAL | 3 refills | Status: DC
  Filled 2023-05-05: qty 30, 30d supply, fill #0
  Filled 2023-06-19: qty 30, 30d supply, fill #1

## 2023-05-12 ENCOUNTER — Ambulatory Visit: Payer: Medicare HMO

## 2023-05-15 ENCOUNTER — Encounter: Payer: Self-pay | Admitting: Family Medicine

## 2023-05-19 ENCOUNTER — Telehealth: Payer: Self-pay | Admitting: *Deleted

## 2023-05-19 NOTE — Telephone Encounter (Signed)
This RN spoke with pt per her call stating she is having continued swelling in her left breast which started approximately 6 weeks ago.  She states swelling "comes and goes but now staying"  She denies any arm swelling of affected breast.  Per discussion- appt made for evaluation for further recommendation.

## 2023-05-20 ENCOUNTER — Encounter: Payer: Self-pay | Admitting: Adult Health

## 2023-05-20 ENCOUNTER — Inpatient Hospital Stay: Payer: Medicare HMO | Attending: Adult Health | Admitting: Adult Health

## 2023-05-20 ENCOUNTER — Other Ambulatory Visit (HOSPITAL_BASED_OUTPATIENT_CLINIC_OR_DEPARTMENT_OTHER): Payer: Self-pay

## 2023-05-20 VITALS — BP 159/78 | HR 101 | Temp 97.2°F | Resp 18 | Ht 60.5 in | Wt 156.1 lb

## 2023-05-20 DIAGNOSIS — N61 Mastitis without abscess: Secondary | ICD-10-CM

## 2023-05-20 DIAGNOSIS — N644 Mastodynia: Secondary | ICD-10-CM | POA: Insufficient documentation

## 2023-05-20 DIAGNOSIS — Z803 Family history of malignant neoplasm of breast: Secondary | ICD-10-CM | POA: Diagnosis not present

## 2023-05-20 DIAGNOSIS — C50412 Malignant neoplasm of upper-outer quadrant of left female breast: Secondary | ICD-10-CM | POA: Diagnosis not present

## 2023-05-20 DIAGNOSIS — Z170 Estrogen receptor positive status [ER+]: Secondary | ICD-10-CM | POA: Diagnosis not present

## 2023-05-20 DIAGNOSIS — Z923 Personal history of irradiation: Secondary | ICD-10-CM | POA: Diagnosis not present

## 2023-05-20 DIAGNOSIS — Z79811 Long term (current) use of aromatase inhibitors: Secondary | ICD-10-CM | POA: Insufficient documentation

## 2023-05-20 DIAGNOSIS — Z1721 Progesterone receptor positive status: Secondary | ICD-10-CM | POA: Diagnosis not present

## 2023-05-20 DIAGNOSIS — Z1732 Human epidermal growth factor receptor 2 negative status: Secondary | ICD-10-CM | POA: Insufficient documentation

## 2023-05-20 MED ORDER — AMOXICILLIN-POT CLAVULANATE 875-125 MG PO TABS
1.0000 | ORAL_TABLET | Freq: Two times a day (BID) | ORAL | 0 refills | Status: DC
  Filled 2023-05-20: qty 20, 10d supply, fill #0

## 2023-05-20 NOTE — Patient Instructions (Signed)
Cellulitis, Adult  Cellulitis is a skin infection. The infected area is usually warm, red, swollen, and tender. It most commonly occurs on the lower body, such as the legs, feet, and toes, but this condition can occur on any part of the body. The infection can travel to the muscles, blood, and underlying tissue and become life-threatening without treatment. It is important to get medical treatment right away for this condition. What are the causes? Cellulitis is caused by bacteria. The bacteria enter through a break in the skin, such as a cut, burn, insect or animal bite, open sore, or crack. What increases the risk? This condition is more likely to occur in people who: Have a weak body's defense system (immune system). Are older than 70 years old. Have diabetes. Have a type of long-term (chronic) liver disease (cirrhosis) or kidney disease. Are obese. Have a skin condition such as: An itchy rash, such as eczema or psoriasis. A fungal rash on the feet or in skinfolds. Blistering rashes, such as shingles or chickenpox. Slow movement of blood in the veins (venous stasis). Fluid buildup below the skin (edema). Have open wounds on the skin, such as cuts, puncture wounds, burns, bites, scrapes, tattoos, piercings, or wounds from surgery. Have had radiation therapy. Use IV drugs. What are the signs or symptoms? Symptoms of this condition include: Skin that looks red, purple, or slightly darker than your usual skin color. Streaks or spots on the skin. Swollen area of the skin. Tenderness or pain when an area of the skin is touched. Warm skin. Fever or chills. Blisters. Tiredness (fatigue). How is this diagnosed? This condition is diagnosed based on a medical history and physical exam. You may also have tests, including: Blood tests. Imaging tests. Tests on a sample of fluid taken from the wound (wound culture). How is this treated? Treatment for this condition may include: Medicines.  These may include antibiotics or medicines to treat allergies (antihistamines). Rest. Applying cold or warm wet cloths (compresses) to the skin. If the condition is severe, you may need to stay in the hospital and get antibiotics through an IV. The infection usually starts to get better within 1-2 days of treatment. Follow these instructions at home: Medicines Take over-the-counter and prescription medicines only as told by your health care provider. If you were prescribed antibiotics, take them as told by your provider. Do not stop using the antibiotic even if you start to feel better. General instructions Drink enough fluid to keep your pee (urine) pale yellow. Do not touch or rub the infected area. Raise (elevate) the infected area above the level of your heart while you are sitting or lying down. Return to your normal activities as told by your provider. Ask your provider what activities are safe for you. Apply warm or cold compresses to the affected area as told by your provider. Keep all follow-up visits. Your provider will need to make sure that a more serious infection is not developing. Contact a health care provider if: You have a fever. Your symptoms do not improve within 1-2 days of starting treatment or you develop new symptoms. Your bone or joint underneath the infected area becomes painful after the skin has healed. Your infection returns in the same area or another area. Signs of this may include: You notice a swollen bump in the infected area. Your red area gets larger, turns dark in color, or becomes more painful. Drainage increases. Pus or a bad smell develops in your infected area. You  have more pain. You feel ill and have muscle aches and weakness. You develop vomiting or diarrhea that will not go away. Get help right away if: You notice red streaks coming from the infected area. You notice the skin turns purple or black and falls off. This symptom may be an  emergency. Get help right away. Call 911. Do not wait to see if the symptom will go away. Do not drive yourself to the hospital. This information is not intended to replace advice given to you by your health care provider. Make sure you discuss any questions you have with your health care provider. Document Revised: 12/11/2021 Document Reviewed: 12/11/2021 Elsevier Patient Education  2024 Elsevier Inc.Amoxicillin; Clavulanic Acid Tablets What is this medication? AMOXICILLIN; CLAVULANIC ACID (a mox i SIL in; KLAV yoo lan ic AS id) treats infections caused by bacteria. It belongs to a group of medications called penicillin antibiotics. It will not treat colds, the flu, or infections caused by viruses. This medicine may be used for other purposes; ask your health care provider or pharmacist if you have questions. COMMON BRAND NAME(S): Augmentin What should I tell my care team before I take this medication? They need to know if you have any of these conditions: Kidney disease Liver disease Mononucleosis Stomach or intestine problems such as colitis An unusual or allergic reaction to amoxicillin, clavulanic acid, other medications, foods, dyes, or preservatives Pregnant or trying to get pregnant Breastfeeding How should I use this medication? Take this medication by mouth. Take it as directed on the prescription label at the same time every day. Take it with food at the start of a meal or snack. Take all of this medication unless your care team tells you to stop it early. Keep taking it even if you think you are better. Talk to your care team about the use of this medication in children. While it may be prescribed for selected conditions, precautions do apply. Overdosage: If you think you have taken too much of this medicine contact a poison control center or emergency room at once. NOTE: This medicine is only for you. Do not share this medicine with others. What if I miss a dose? If you miss a  dose, take it as soon as you can. If it is almost time for your next dose, take only that dose. Do not take double or extra doses. What may interact with this medication? Allopurinol Anticoagulants Estrogen or progestin hormones Methotrexate Probenecid This list may not describe all possible interactions. Give your health care provider a list of all the medicines, herbs, non-prescription drugs, or dietary supplements you use. Also tell them if you smoke, drink alcohol, or use illegal drugs. Some items may interact with your medicine. What should I watch for while using this medication? Tell your care team if your symptoms do not start to get better or if they get worse. This medication may cause serious skin reactions. They can happen weeks to months after starting the medication. Contact your care team right away if you notice fevers or flu-like symptoms with a rash. The rash may be red or purple and then turn into blisters or peeling of the skin. You may also notice a red rash with swelling of the face, lips, or lymph nodes in your neck or under your arms. Do not treat diarrhea with over the counter products. Contact your care team if you have diarrhea that lasts more than 2 days or if it is severe and watery. If  you have diabetes, you may get a false-positive result for sugar in your urine. Check with your care team. Estrogen and progestin hormones may not work as well while you are taking this medication. A barrier contraceptive, such as a condom or diaphragm, is recommended if you are using these hormones for contraception. Talk to your care team about effective forms of contraception. What side effects may I notice from receiving this medication? Side effects that you should report to your care team as soon as possible: Allergic reactions--skin rash, itching, hives, swelling of the face, lips, tongue, or throat Liver injury--right upper belly pain, loss of appetite, nausea, light-colored  stool, dark yellow or brown urine, yellowing skin or eyes, unusual weakness or fatigue Redness, blistering, peeling, or loosening of the skin, including inside the mouth Severe diarrhea, fever Severe or prolonged vomiting or diarrhea Unusual vaginal discharge, itching, or odor Side effects that usually do not require medical attention (report these to your care team if they continue or are bothersome): Diarrhea Nausea Vomiting This list may not describe all possible side effects. Call your doctor for medical advice about side effects. You may report side effects to FDA at 1-800-FDA-1088. Where should I keep my medication? Keep out of the reach of children and pets. Store at room temperature between 20 and 25 degrees C (68 and 77 degrees F). Throw away any unused medication after the expiration date. NOTE: This sheet is a summary. It may not cover all possible information. If you have questions about this medicine, talk to your doctor, pharmacist, or health care provider.  2024 Elsevier/Gold Standard (2023-03-28 00:00:00)

## 2023-05-20 NOTE — Progress Notes (Signed)
[name] Cancer Follow up:    [name], DO [address] [place] [address]   DIAGNOSIS:  Cancer Staging  Malignant neoplasm of upper-outer quadrant of left breast in female, estrogen receptor positive (HCC) Staging form: Breast, AJCC 8th Edition - Clinical stage from 06/26/2022: Stage IA (cT1b, cN0, cM0, G2, ER+, PR+, HER2-) - Signed by Ronny Bacon, PA-C on 06/26/2022 Stage prefix: Initial diagnosis Method of lymph node assessment: Clinical Histologic grading system: 3 grade system   SUMMARY OF ONCOLOGIC HISTORY: Oncology History  Malignant neoplasm of upper-outer quadrant of left breast in female, estrogen receptor positive (HCC)  06/11/2022 Mammogram   Bilateral diagnostic mammogram showed suspicious left breast mass at 1 o'clock position.  Ultrasound-guided biopsy was recommended.  There were also indeterminate left breast calcs recommend stereotactic biopsy.  No suspicious left axillary adenopathy.  Right breast normal   06/20/2022 Pathology Results   Pathology results showed grade 2 invasive ductal carcinoma prognostics from the first specimen at 1:00 10 cm from the nipple showed ER 100% positive strong staining PR 20% positive moderate to strong staining and Ki-67 of 10%, tumor cells negative for HER2 1+.  Second biopsy which was from upper outer quadrant coil shaped clip also showed invasive ductal carcinoma grade 2 prognostics for this 1 showed ER 95% positive strong staining PR 0% negative Ki-67 of 15% and HER2 2+ by IHC and FISH negative   06/26/2022 Cancer Staging   Staging form: Breast, AJCC 8th Edition - Clinical stage from 06/26/2022: Stage IA (cT1b, cN0, cM0, G2, ER+, PR+, HER2-) - Signed by Ronny Bacon, PA-C on 06/26/2022 Stage prefix: Initial diagnosis Method of lymph node assessment: Clinical Histologic grading system: 3 grade system    Genetic Testing   [facility] Custom Panel+RNA was Negative. Of  note, a variant of uncertain significance was detected in the NF1 gene (c.5668A>G). Report date was 07/04/2022.  The Custom Hereditary Cancers Panel offered by [facility] includes sequencing and/or deletion duplication testing of the following 43 genes: APC, ATM, AXIN2, BAP1, BARD1, BMPR1A, BRCA1, BRCA2, BRIP1, CDH1, CDK4, CDKN2A (p14ARF and p16INK4a only), CHEK2, CTNNA1, EPCAM (Deletion/duplication testing only), FH, GREM1 (promoter region duplication testing only), HOXB13, KIT, MBD4, MEN1, MLH1, MSH2, MSH3, MSH6, MUTYH, NF1, NHTL1, PALB2, PDGFRA, PMS2, POLD1, POLE, PTEN, RAD51C, RAD51D, SMAD4, SMARCA4. STK11, TP53, TSC1, TSC2, and VHL.  UPDATE: NF1 c.5668A>G (p.[id number]) VUS has been amended to Likely Benign. Amended report date is 08/19/2022.   09/09/2022 - 10/07/2022 Radiation Therapy   Plan Name: Breast_L_BH Site: Breast, Left Technique: 3D Mode: Photon Dose Per Fraction: 2.66 Gy Prescribed Dose (Delivered / Prescribed): 42.56 Gy / 42.56 Gy Prescribed Fxs (Delivered / Prescribed): 16 / 16   Plan Name: Brst_L_Bst_BH Site: Breast, Left Technique: 3D Mode: Photon Dose Per Fraction: 2 Gy Prescribed Dose (Delivered / Prescribed): 8 Gy / 8 Gy Prescribed Fxs (Delivered / Prescribed): 4 / 4   10/2022 -  Anti-estrogen oral therapy   Anastrozole     CURRENT THERAPY: Anastrozole  INTERVAL HISTORY:   Discussed the use of AI scribe software for clinical note transcription with the patient, who gave verbal consent to proceed.  Linda Wood 68 y.o. female with a history of left breast cancer, presents with left breast pain and swelling. The discomfort is located underneath the breast and has been progressively worsening, although it has recently subsided slightly. The patient is particularly concerned due to the history of cancer in the same breast. She has  not noticed any changes in her medication regimen. The patient also mentions a scheduled mammogram and ultrasound, which she has yet to  confirm due to scheduling conflicts.   Patient Active Problem List   Diagnosis Date Noted   Genetic testing 07/05/2022   Family history of breast cancer 06/27/2022   Malignant neoplasm of upper-outer quadrant of left breast in female, estrogen receptor positive (HCC) 06/25/2022   Psychophysiological insomnia 11/28/2021   Rheumatoid arthritis (HCC) 10/29/2021   Essential hypertension 10/29/2021   Depression, major, single episode, complete remission (HCC) 10/29/2021   Rib fractures 10/15/2021    is allergic to dextromethorphan, dextromethorphan-guaifenesin, and promethazine.  MEDICAL HISTORY: Past Medical History:  Diagnosis Date   Anxiety    Breast cancer (HCC)    Depression    Hypertension    Rheumatoid arthritis (HCC)     SURGICAL HISTORY: Past Surgical History:  Procedure Laterality Date   BREAST BIOPSY Left 06/20/2022   [place] LT BREAST BX W LOC DEV 1ST LESION IMG BX SPEC US GUIDE 06/20/2022 [facility]   BREAST BIOPSY Left 06/20/2022   MM LT BREAST BX W LOC DEV 1ST LESION IMAGE BX SPEC STEREO GUIDE 06/20/2022 [facility]   BREAST BIOPSY  07/15/2022   MM LT RADIOACTIVE SEED LOC MAMMO GUIDE 07/15/2022 [facility]   BREAST BIOPSY  07/15/2022   MM LT RADIOACTIVE SEED EA ADD LESION LOC MAMMO GUIDE 07/15/2022 [facility]   BREAST LUMPECTOMY WITH RADIOACTIVE SEED AND SENTINEL LYMPH NODE BIOPSY Left 07/16/2022   Procedure: LEFT BREAST LUMPECTOMY WITH RADIOACTIVE SEED X2 AND AXILLARY SENTINEL LYMPH NODE BIOPSY;  Surgeon: Manus Rudd, MD;  Location: [facility] OR;  Service: General;  Laterality: Left;   CATARACT EXTRACTION, BILATERAL     08/14/2022, 08/28/2022   GASTRIC BYPASS     HAND SURGERY Right    LASIK     RE-EXCISION OF BREAST LUMPECTOMY Left 07/24/2022   Procedure: RE-EXCISION OF LEFT BREAST LUMPECTOMY ANTERIOR AND LATERAL MARGINS;  Surgeon: Manus Rudd, MD;  Location: [facility] OR;  Service: General;  Laterality: Left;  45 MIN ROOM 12    SOCIAL  HISTORY: Social History   Socioeconomic History   Marital status: Married    Spouse name: Linda Wood   Number of children: Not on file   Years of education: Not on file   Highest education level: Not on file  Occupational History   Not on file  Tobacco Use   Smoking status: Never    Passive exposure: Never   Smokeless tobacco: Never  Vaping Use   Vaping status: Never Used  Substance and Sexual Activity   Alcohol use: Yes    Alcohol/week: 3.0 standard drinks of alcohol    Types: 3 Glasses of wine per week    Comment: wine 3 times per week   Drug use: Never   Sexual activity: Yes    Partners: Male  Other Topics Concern   Not on file  Social History Narrative   Not on file   Social Drivers of Health   Financial Resource Strain: Low Risk  (06/26/2022)   Overall Financial Resource Strain (CARDIA)    Difficulty of Paying Living Expenses: Not very hard  Food Insecurity: No Food Insecurity (06/26/2022)   Hunger Vital Sign    Worried About Running Out of Food in the Last Year: Never true    Ran Out of Food in the Last Year: Never true  Transportation Needs: No Transportation Needs (06/26/2022)   PRAPARE - Transportation    Lack  of Transportation (Medical): No    Lack of Transportation (Non-Medical): No  Physical Activity: Not on file  Stress: Not on file  Social Connections: Not on file  Intimate Partner Violence: Not on file    FAMILY HISTORY: Family History  Problem Relation Age of Onset   Heart failure Mother    Breast cancer Mother 9    Review of Systems  Constitutional:  Negative for appetite change, chills, fatigue, fever and unexpected weight change.  HENT:   Negative for hearing loss, lump/mass and trouble swallowing.   Eyes:  Negative for eye problems and icterus.  Respiratory:  Negative for chest tightness, cough and shortness of breath.   Cardiovascular:  Negative for chest pain, leg swelling and palpitations.  Gastrointestinal:  Negative for abdominal  distention, abdominal pain, constipation, diarrhea, nausea and vomiting.  Endocrine: Negative for hot flashes.  Genitourinary:  Negative for difficulty urinating.   Musculoskeletal:  Negative for arthralgias.  Skin:  Negative for itching and rash.  Neurological:  Negative for dizziness, extremity weakness, headaches and numbness.  Hematological:  Negative for adenopathy. Does not bruise/bleed easily.  Psychiatric/Behavioral:  Negative for depression. The patient is not nervous/anxious.       PHYSICAL EXAMINATION    Vitals:   05/20/23 1032  BP: (!) 159/78  Pulse: (!) 101  Resp: 18  Temp: (!) 97.2 F (36.2 C)  SpO2: 98%    Physical Exam Constitutional:      General: She is not in acute distress.    Appearance: Normal appearance. She is not toxic-appearing.  HENT:     Head: Normocephalic and atraumatic.     Mouth/Throat:     Mouth: Mucous membranes are moist.     Pharynx: Oropharynx is clear. No oropharyngeal exudate or posterior oropharyngeal erythema.  Eyes:     General: No scleral icterus. Cardiovascular:     Rate and Rhythm: Normal rate and regular rhythm.     Pulses: Normal pulses.     Heart sounds: Normal heart sounds.  Pulmonary:     Effort: Pulmonary effort is normal.     Breath sounds: Normal breath sounds.  Chest:     Comments: Right breast is benign left breast is status postlumpectomy and radiation, the breast is swollen, slightly tender, warm, and erythematous throughout. Abdominal:     General: Abdomen is flat. Bowel sounds are normal. There is no distension.     Palpations: Abdomen is soft.     Tenderness: There is no abdominal tenderness.  Musculoskeletal:        General: No swelling.     Cervical back: Neck supple.  Lymphadenopathy:     Cervical: No cervical adenopathy.  Skin:    General: Skin is warm and dry.     Findings: No rash.  Neurological:     General: No focal deficit present.     Mental Status: She is alert.  Psychiatric:         Mood and Affect: Mood normal.        Behavior: Behavior normal.     LABORATORY DATA:  CBC    Component Value Date/Time   WBC 6.6 06/26/2022 0802   WBC 8.1 05/10/2022 1101   RBC 4.57 06/26/2022 0802   HGB 12.8 06/26/2022 0802   HCT 39.6 06/26/2022 0802   PLT 320 06/26/2022 0802   MCV 86.7 06/26/2022 0802   MCH 28.0 06/26/2022 0802   MCHC 32.3 06/26/2022 0802   RDW 15.4 06/26/2022 0802   LYMPHSABS 2.0 06/26/2022  0802   MONOABS 0.7 06/26/2022 0802   EOSABS 0.4 06/26/2022 0802   BASOSABS 0.1 06/26/2022 0802    CMP     Component Value Date/Time   NA 141 06/26/2022 0802   K 4.1 06/26/2022 0802   CL 107 06/26/2022 0802   CO2 29 06/26/2022 0802   GLUCOSE 66 (L) 06/26/2022 0802   BUN 15 06/26/2022 0802   CREATININE 0.59 06/26/2022 0802   CREATININE 0.65 05/10/2022 1101   CALCIUM 9.8 06/26/2022 0802   PROT 6.1 (L) 06/26/2022 0802   ALBUMIN 3.8 06/26/2022 0802   AST 13 (L) 06/26/2022 0802   ALT 10 06/26/2022 0802   ALKPHOS 73 06/26/2022 0802   BILITOT 0.3 06/26/2022 0802   GFRNONAA >60 06/26/2022 0802         ASSESSMENT and THERAPY PLAN:   Malignant neoplasm of upper-outer quadrant of left breast in female, estrogen receptor positive (HCC) Linda Wood is a 68 year old woman with history of stage Ia ER/PR positive breast cancer diagnosed in February 2024.  She is status postlumpectomy followed by adjuvant radiation therapy and began on anastrozole in July 2024.  Breast Pain and Redness Pain and redness in the left breast, the same breast where cancer was previously treated. Possible breast lymphedema or cellulitis. No indication of cancer recurrence. -Start antibiotics with Augmentin twice a day for 10 days. -Schedule mammogram and ultrasound for further evaluation. -Contact the office if symptoms worsen or new symptoms develop.  General Health Maintenance / Followup Plans -Scheduled follow-up appointment with Dr. Guillermina City in March. -Results from the mammogram and  ultrasound will be reviewed and any necessary follow-up will be communicated.   All questions were answered. The patient knows to call the clinic with any problems, questions or concerns. We can certainly see the patient much sooner if necessary.  Total encounter time:20 minutes*in face-to-face visit time, chart review, lab review, care coordination, order entry, and documentation of the encounter time.    Lillard Anes, NP 05/20/23 11:22 AM Medical Oncology and Hematology [facility] [address] [place], [place] [address] Tel. [phone]    Fax. [phone]  *Total Encounter Time as defined by the Centers for Medicare and Medicaid Services includes, in addition to the face-to-face time of a patient visit (documented in the note above) non-face-to-face time: obtaining and reviewing outside history, ordering and reviewing medications, tests or procedures, care coordination (communications with other health care professionals or caregivers) and documentation in the medical record.

## 2023-05-20 NOTE — Assessment & Plan Note (Addendum)
Linda Wood is a 68 year old woman with history of stage Ia ER/PR positive breast cancer diagnosed in February 2024.  She is status postlumpectomy followed by adjuvant radiation therapy and began on anastrozole in July 2024.  Breast Pain and Redness Pain and redness in the left breast, the same breast where cancer was previously treated. Possible breast lymphedema or cellulitis. No indication of cancer recurrence. -Start antibiotics with Augmentin twice a day for 10 days. -Schedule mammogram and ultrasound for further evaluation. -Contact the office if symptoms worsen or new symptoms develop.  General Health Maintenance / Followup Plans -Scheduled follow-up appointment with Dr. Guillermina City in March. -Results from the mammogram and ultrasound will be reviewed and any necessary follow-up will be communicated.

## 2023-05-21 ENCOUNTER — Ambulatory Visit
Admission: RE | Admit: 2023-05-21 | Discharge: 2023-05-21 | Disposition: A | Discharge status: Home / Self Care | Payer: Medicare HMO | Source: Ambulatory Visit | Attending: Adult Health | Admitting: Adult Health

## 2023-05-21 ENCOUNTER — Ambulatory Visit: Admission: RE | Admit: 2023-05-21 | Payer: Medicare HMO | Source: Ambulatory Visit

## 2023-05-21 DIAGNOSIS — N61 Mastitis without abscess: Secondary | ICD-10-CM

## 2023-05-21 DIAGNOSIS — N6459 Other signs and symptoms in breast: Secondary | ICD-10-CM | POA: Diagnosis not present

## 2023-05-21 DIAGNOSIS — Z170 Estrogen receptor positive status [ER+]: Secondary | ICD-10-CM

## 2023-05-26 ENCOUNTER — Other Ambulatory Visit: Payer: Self-pay

## 2023-05-26 ENCOUNTER — Ambulatory Visit: Payer: Medicare HMO | Attending: Surgery

## 2023-05-26 VITALS — Wt 157.1 lb

## 2023-05-26 DIAGNOSIS — Z483 Aftercare following surgery for neoplasm: Secondary | ICD-10-CM | POA: Insufficient documentation

## 2023-05-26 DIAGNOSIS — Z170 Estrogen receptor positive status [ER+]: Secondary | ICD-10-CM

## 2023-05-26 NOTE — Therapy (Signed)
OUTPATIENT PHYSICAL THERAPY SOZO SCREENING NOTE   Patient Name: Linda Wood MRN: [id number] DOB:06/16/1955, 68 y.o., female Today's Date: 05/26/2023  PCP: [name], DO REFERRING PROVIDER: Manus Rudd, MD   PT End of Session - 05/26/23 1000     Visit Number 2   # unchanged due to screen only   PT Start Time 0958    PT Stop Time 1005    PT Time Calculation (min) 7 min    Activity Tolerance Patient tolerated treatment well    Behavior During Therapy [facility] for tasks assessed/performed             Past Medical History:  Diagnosis Date   Anxiety    Breast cancer (HCC)    Depression    Hypertension    Rheumatoid arthritis (HCC)    Past Surgical History:  Procedure Laterality Date   BREAST BIOPSY Left 06/20/2022   [place] LT BREAST BX W LOC DEV 1ST LESION IMG BX SPEC US GUIDE 06/20/2022 [facility]   BREAST BIOPSY Left 06/20/2022   MM LT BREAST BX W LOC DEV 1ST LESION IMAGE BX SPEC STEREO GUIDE 06/20/2022 [facility]   BREAST BIOPSY  07/15/2022   MM LT RADIOACTIVE SEED LOC MAMMO GUIDE 07/15/2022 [facility]   BREAST BIOPSY  07/15/2022   MM LT RADIOACTIVE SEED EA ADD LESION LOC MAMMO GUIDE 07/15/2022 [facility]   BREAST LUMPECTOMY WITH RADIOACTIVE SEED AND SENTINEL LYMPH NODE BIOPSY Left 07/16/2022   Procedure: LEFT BREAST LUMPECTOMY WITH RADIOACTIVE SEED X2 AND AXILLARY SENTINEL LYMPH NODE BIOPSY;  Surgeon: Manus Rudd, MD;  Location: [facility] OR;  Service: General;  Laterality: Left;   CATARACT EXTRACTION, BILATERAL     08/14/2022, 08/28/2022   GASTRIC BYPASS     HAND SURGERY Right    LASIK     RE-EXCISION OF BREAST LUMPECTOMY Left 07/24/2022   Procedure: RE-EXCISION OF LEFT BREAST LUMPECTOMY ANTERIOR AND LATERAL MARGINS;  Surgeon: Manus Rudd, MD;  Location: [facility] OR;  Service: General;  Laterality: Left;  45 MIN ROOM 12   Patient Active Problem List   Diagnosis Date Noted   Genetic testing 07/05/2022   Family history of breast  cancer 06/27/2022   Malignant neoplasm of upper-outer quadrant of left breast in female, estrogen receptor positive (HCC) 06/25/2022   Psychophysiological insomnia 11/28/2021   Rheumatoid arthritis (HCC) 10/29/2021   Essential hypertension 10/29/2021   Depression, major, single episode, complete remission (HCC) 10/29/2021   Rib fractures 10/15/2021    REFERRING DIAG: left breast cancer at risk for lymphedema  THERAPY DIAG:  Aftercare following surgery for neoplasm  PERTINENT HISTORY: Patient was diagnosed on 06/20/2022 with left grade 2 invasive ductal carcinoma breast cancer. It measures 7 mm and is located in the upper outer quadrant. It is ER/PR positive and HER2 negative with a Ki67 of 10% . She had a double  left lumpectomy with SLNB on 07/16/2022, followed by a re-excision on 07/24/2022 to get clear margins   PRECAUTIONS: left UE Lymphedema risk, None  SUBJECTIVE: Pt returns for her 3 month L-Dex screen. "I started having breat swelling last month. The doctor put me on an antibiotic last month but my breast is still really uncomfortable."  PAIN:  Are you having pain? No  SOZO SCREENING: Patient was assessed today using the SOZO machine to determine the lymphedema index score. This was compared to her baseline score. It was determined that she is within the recommended range when compared to her baseline and no further action is  needed at this time. She will continue SOZO screenings. These are done every 3 months for 2 years post operatively followed by every 6 months for 2 years, and then annually.  Patient reported a change in status to PTA which initiated the PTA consulting with a PT. PT determined it would be appropriate to initiate therapy at this time. PT requested a referral from patient's provider.    L-DEX FLOWSHEETS - 05/26/23 1000       L-DEX LYMPHEDEMA SCREENING   Measurement Type Unilateral    L-DEX MEASUREMENT EXTREMITY Upper Extremity    POSITION  Standing     DOMINANT SIDE Right    At Risk Side Left    BASELINE SCORE (UNILATERAL) 8.2    L-DEX SCORE (UNILATERAL) 6.1    VALUE CHANGE (UNILAT) -2.1               Hermenia Bers, PTA 05/26/2023, 5:25 PM

## 2023-05-27 NOTE — Therapy (Incomplete)
OUTPATIENT PHYSICAL THERAPY  UPPER EXTREMITY ONCOLOGY EVALUATION  Patient Name: Linda Wood MRN: [id number] DOB:[birth year], 68 y.o., female Today's Date: 05/28/2023  END OF SESSION:  PT End of Session - 05/28/23 1205     Visit Number 1    Number of Visits 12    Date for PT Re-Evaluation 07/09/23    Authorization Type [employer]    PT Start Time 1205    PT Stop Time 1255    PT Time Calculation (min) 50 min    Activity Tolerance Patient tolerated treatment well    Behavior During Therapy WFL for tasks assessed/performed             Past Medical History:  Diagnosis Date   Anxiety    Breast cancer (HCC)    Depression    Hypertension    Rheumatoid arthritis (HCC)    Past Surgical History:  Procedure Laterality Date   BREAST BIOPSY Left 06/20/2022   [place] LT BREAST BX W LOC DEV 1ST LESION IMG BX SPEC US GUIDE 06/20/2022 [facility]   BREAST BIOPSY Left 06/20/2022   MM LT BREAST BX W LOC DEV 1ST LESION IMAGE BX SPEC STEREO GUIDE 06/20/2022 [facility]   BREAST BIOPSY  07/15/2022   MM LT RADIOACTIVE SEED LOC MAMMO GUIDE 07/15/2022 [facility]   BREAST BIOPSY  07/15/2022   MM LT RADIOACTIVE SEED EA ADD LESION LOC MAMMO GUIDE 07/15/2022 [facility]   BREAST LUMPECTOMY WITH RADIOACTIVE SEED AND SENTINEL LYMPH NODE BIOPSY Left 07/16/2022   Procedure: LEFT BREAST LUMPECTOMY WITH RADIOACTIVE SEED X2 AND AXILLARY SENTINEL LYMPH NODE BIOPSY;  Surgeon: Manus Rudd, MD;  Location: [facility];  Service: General;  Laterality: Left;   CATARACT EXTRACTION, BILATERAL     08/14/2022, 08/28/2022   GASTRIC BYPASS     HAND SURGERY Right    LASIK     RE-EXCISION OF BREAST LUMPECTOMY Left 07/24/2022   Procedure: RE-EXCISION OF LEFT BREAST LUMPECTOMY ANTERIOR AND LATERAL MARGINS;  Surgeon: Manus Rudd, MD;  Location: [facility];  Service: General;  Laterality: Left;  45 MIN ROOM 12   Patient Active Problem List   Diagnosis Date Noted   Genetic testing 07/05/2022   Family  history of breast cancer 06/27/2022   Malignant neoplasm of upper-outer quadrant of left breast in female, estrogen receptor positive (HCC) 06/25/2022   Psychophysiological insomnia 11/28/2021   Rheumatoid arthritis (HCC) 10/29/2021   Essential hypertension 10/29/2021   Depression, major, single episode, complete remission (HCC) 10/29/2021   Rib fractures 10/15/2021    PCP:   REFERRING PROVIDER: Lillard Anes, NP  REFERRING DIAG:L  Breast lymphedema s/p surgery and radiation  THERAPY DIAG:  Aftercare following surgery for neoplasm  Malignant neoplasm of upper-outer quadrant of left breast in female, estrogen receptor positive (HCC)  Abnormal posture  Lymphedema of breast  ONSET DATE: December 2024  Rationale for Evaluation and Treatment: Rehabilitation  SUBJECTIVE:  SUBJECTIVE STATEMENT:  Pt with concerns of left breast pain and swelling which started about a month ago. When she was seen on 05/20/2023 by Lillard Anes the breast was tender, warm and erythematous. She was given Augmentin for 10 days and was to schedule mammogram and [place] for further evaluation. She has  2 more doses of Augmentin left.  Very little change in Redness with Augmentin and no change in pain. Swelling has reduced a little. It bothers her to sleep on her left side, and to reach high  PERTINENT HISTORY:   Patient was diagnosed on 06/20/2022 with left grade 2 invasive ductal carcinoma breast cancer. It measures 7 mm and is located in the upper outer quadrant. It is ER/PR positive and HER2 negative with a Ki67 of 10% . She had a double  left lumpectomy with SLNB on 07/16/2022, followed by a re-excision on 07/24/2022 to get clear margins. She had radiation from 09/09/2022-10/07/2022 and is currently taking Anastrozole.   PAIN:  Are  you having pain? Yes NPRS scale: 0/10 best to 5/10 Pain location: left breast Pain orientation: Left  PAIN TYPE: aching Pain description: intermittent  Aggravating factors: sleeping on left, reaching high with left UE Relieving factors: rest,  PRECAUTIONS: Left UE lymphedema risk, RA  RED FLAGS: None   WEIGHT BEARING RESTRICTIONS: No  FALLS:  Has patient fallen in last 6 months? No  LIVING ENVIRONMENT: Lives with: lives with their spouse Lives in: House/apartment   OCCUPATION: does not work  LEISURE: needlepoint, Archivist  HAND DOMINANCE: right   PRIOR LEVEL OF FUNCTION: Independent  PATIENT GOALS: make swelling better, pain better   OBJECTIVE: Note: Objective measures were completed at Evaluation unless otherwise noted.  COGNITION: Overall cognitive status: Within functional limits for tasks assessed   PALPATION: Fibrosis noted inferior medial breast, no significant tenderness  OBSERVATIONS / OTHER ASSESSMENTS: pt is wearing a compression bra [facility]) with impressions from bra medial and lateral breast, redness noted with picture in media and present for 4 weeks. Unchanged from antibiotics.peau'd'orange inferior medial left breast with fibrosis, enlarged pores medially greater than laterally. Generalized breast swelling. Minimal warmth  SENSATION: Light touch: Deficits    POSTURE: forward head, rounded shoulders  UPPER EXTREMITY AROM/PROM:  A/PROM RIGHT   eval   Shoulder extension   Shoulder flexion 137  Shoulder abduction 130  Shoulder internal rotation   Shoulder external rotation     (Blank rows = not tested)  A/PROM LEFT   eval  Shoulder extension   Shoulder flexion 138, + in breast  Shoulder abduction 103  Shoulder internal rotation   Shoulder external rotation     (Blank rows = not tested)  CERVICAL AROM: All within functional limits:     UPPER EXTREMITY STRENGTH:   LYMPHEDEMA ASSESSMENTS:   SURGERY TYPE/DATE: double left  lumpectomy with SLNB on 07/16/2022, followed by a re-excision on 07/24/2022 to get clear margins   NUMBER OF LYMPH NODES REMOVED: 0/2  CHEMOTHERAPY: NO  RADIATION:YES  HORMONE TREATMENT: YES, Asastrozole  INFECTIONS: has taken Augmentin and has 1 and a half days left; no real change in redness. Redness present x 4 weeks per pt.   LYMPHEDEMA ASSESSMENTS:   LANDMARK RIGHT  eval  At axilla    15 cm proximal to olecranon process   10 cm proximal to olecranon process   Olecranon process   15 cm proximal to ulnar styloid process   10 cm proximal to ulnar styloid process   Just proximal to ulnar styloid  process   Across hand at thumb web space   At base of 2nd digit   (Blank rows = not tested)  LANDMARK LEFT  eval  At axilla    15 cm proximal to olecranon process   10 cm proximal to olecranon process   Olecranon process   15 cm proximal to ulnar styloid process   10 cm proximal to ulnar styloid process   Just proximal to ulnar styloid process   Across hand at thumb web space   At base of 2nd digit   (Blank rows = not tested)   FUNCTIONAL TESTS:    GAIT: WNL   L-DEX LYMPHEDEMA SCREENING: The patient was assessed using the L-Dex machine today to produce a lymphedema index baseline score. The patient will be reassessed on a regular basis (typically every 3 months) to obtain new L-Dex scores. If the score is > 6.5 points away from his/her baseline score indicating onset of subclinical lymphedema, it will be recommended to wear a compression garment for 4 weeks, 12 hours per day and then be reassessed. If the score continues to be > 6.5 points from baseline at reassessment, we will initiate lymphedema treatment. Assessing in this manner has a 95% rate of preventing clinically significant lymphedema. SCREEN 05/26/2023 -2.1    BREAST COMPLAINTS QUESTIONNAIRE Pain:5 Heaviness:9 Swollen feeling:9 Tense Skin:9 Redness:7 Bra Print:7 Size of Pores:9 Hard feeling:8  Total:   63   /80 A Score over 9 indicates lymphedema issues in the breast                                                                                                                         TREATMENT DATE:  05/28/2023 Showed pt diagram of lymphatics and explained gentleness of touch, importance of stretch,LN activation etc In supine: Short neck, 5 diaphragmatic breaths, R axillary nodes and establishment of interaxillary pathway, L inguinal nodes and establishment of axilloinguinal pathway, then L breast moving fluid towards pathways spending extra time in any areas of fibrosis then retracing all steps while explaining to pt. Photo taken and placed in Media and Charlie Pitter messaged to look at photo. Feel redness more than likely from the swelling since it has been about the same for 4 weeks per pt. And with minimal warmth     PATIENT EDUCATION:  Education details: POC, treatment interventions, education in MLD Person educated: Patient Education method: Programmer, multimedia, demonstration Education comprehension: verbalized understanding and needs further education  HOME EXERCISE PROGRAM:   ASSESSMENT:  CLINICAL IMPRESSION: Patient is a 68 y.o. female who was seen today for physical therapy evaluation and treatment  s/p left lumpectomy with SLNB on 07/16/2022, and left re-excision to get clear margins on 07/24/2022. Marland KitchenShe had radiation from 09/09/2022-10/07/2022. She developed swelling and pain in the left breast  in December and was placed on augmentin on 05/20/2023 for 10 days. Redness has been present for 4 weeks and has changed very little, so it is likely from the swelling. She  has been wearing her compression bra and a script was given to her to go to [employer] [place] to purchase another bra. We initiated MLD today to the left breast with verbal instruction to pt. Pt will benefit from skilled PT to address deficits and return to prior level of function.   OBJECTIVE IMPAIRMENTS: decreased knowledge of  condition, decreased ROM, increased edema, impaired UE functional use, postural dysfunction, and pain.   ACTIVITY LIMITATIONS: sleeping, bed mobility, and reach over head  PARTICIPATION LIMITATIONS:  able to participate in most but with breast swelling/discomfort  PERSONAL FACTORS: 1-2 comorbidities: Left breast cancer s/p radiation  are also affecting patient's functional outcome.   REHAB POTENTIAL: Good  CLINICAL DECISION MAKING: Stable/uncomplicated  EVALUATION COMPLEXITY: Low  GOALS: Goals reviewed with patient? Yes  SHORT TERM GOALS=LONG TERM GOALS: Target date: 07/09/2023  Pt will be independent with use of compression bra to decrease left breast swelling Baseline: Goal status: INITIAL  2.  Pt will be independent in left breast MLD to decrease swelling Baseline:  Goal status: INITIAL  3.  Pt will report breast pain atleast 50% better Baseline:  Goal status: INITIAL  4.  Pt will have breast complaints questionnaire improved to no greater than 20 to demonstrate improved swelling Baseline:  Goal status: INITIAL  5.  Pt will be able to reach with the left UE without left breast pain Baseline:  Goal status: INITIAL  6.  Pt will be able to sleep on the left side for short periods of time Baseline:  Goal status: INITIAL   PLAN:  PT FREQUENCY: 2x/week  PT DURATION: 6 weeks  PLANNED INTERVENTIONS: 97164- PT Re-evaluation, 97110-Therapeutic exercises, 97530- Therapeutic activity, 97112- Neuromuscular re-education, 97535- Self Care, 96045- Manual therapy, and Patient/Family education  PLAN FOR NEXT SESSION: check breast redness, did she get another compression bra, continue MLD and education of patient, check left shoulder ROM (be aware of RA)  Waynette Buttery, PT 05/28/2023, 1:14 PM

## 2023-05-28 ENCOUNTER — Ambulatory Visit: Payer: Medicare HMO | Attending: Adult Health

## 2023-05-28 ENCOUNTER — Other Ambulatory Visit: Payer: Self-pay

## 2023-05-28 DIAGNOSIS — Z483 Aftercare following surgery for neoplasm: Secondary | ICD-10-CM | POA: Diagnosis not present

## 2023-05-28 DIAGNOSIS — R293 Abnormal posture: Secondary | ICD-10-CM | POA: Insufficient documentation

## 2023-05-28 DIAGNOSIS — C50412 Malignant neoplasm of upper-outer quadrant of left female breast: Secondary | ICD-10-CM | POA: Diagnosis not present

## 2023-05-28 DIAGNOSIS — I890 Lymphedema, not elsewhere classified: Secondary | ICD-10-CM | POA: Diagnosis not present

## 2023-05-28 DIAGNOSIS — Z170 Estrogen receptor positive status [ER+]: Secondary | ICD-10-CM | POA: Insufficient documentation

## 2023-05-28 NOTE — Addendum Note (Signed)
Addended by: Waynette Buttery on: 05/28/2023 05:23 PM   Modules accepted: Orders

## 2023-06-03 ENCOUNTER — Encounter: Payer: Self-pay | Admitting: Rehabilitation

## 2023-06-03 ENCOUNTER — Ambulatory Visit: Payer: Medicare HMO | Attending: Adult Health | Admitting: Rehabilitation

## 2023-06-03 DIAGNOSIS — I890 Lymphedema, not elsewhere classified: Secondary | ICD-10-CM | POA: Diagnosis not present

## 2023-06-03 DIAGNOSIS — R293 Abnormal posture: Secondary | ICD-10-CM | POA: Insufficient documentation

## 2023-06-03 DIAGNOSIS — Z483 Aftercare following surgery for neoplasm: Secondary | ICD-10-CM | POA: Insufficient documentation

## 2023-06-03 DIAGNOSIS — Z170 Estrogen receptor positive status [ER+]: Secondary | ICD-10-CM | POA: Insufficient documentation

## 2023-06-03 DIAGNOSIS — C50412 Malignant neoplasm of upper-outer quadrant of left female breast: Secondary | ICD-10-CM | POA: Diagnosis not present

## 2023-06-03 NOTE — Patient Instructions (Signed)
 Manual Lymph Drainage for Left Breast.   Do daily.  Do slowly. Use flat hands with just enough pressure to stretch the skin. Do not slide over the skin, stretch the skin with the hand. (Stretch  Relax  Move) Lie down or sit comfortably (in a recliner, for example) to do this.   Do circles at each collar bone near the neck 5-7 times (to "wake up" lots of lymph nodes in this area).  Take slow deep breaths, allowing your belly to balloon out as you breathe in, 5x (to "wake up" abdominal lymph nodes).  Do circles in both armpits--stretch skin in small circles to stimulate intact lymph nodes there, 5-7x.  Do Circles in the left groin area, at panty line--stretch skin to stimulate lymph nodes 5-7x.  Redirect fluid from left chest toward right armpit (stretch skin starting at left chest in 3-4 spots working toward right armpit) 3-4x across the chest.  Redirect fluid from left armpit toward left groin (cup your hand around the curve of your left side and do 3-4 "pumps" from armpit to groin) 3-4x down your side.  Draw an imaginary diagonal line from upper outer breast through the nipple area toward lower inner breast.  Direct fluid above this line upward and inward toward the pathway across your chest (established in #5).  Then direct the fluid below this line down and out toward the pathway down your side going towards the left groin (established in #6). Do this for a few minutes or until you feel any improvements.   Then repeat your pathways 2-3x  (#5 and #6)  End with repeating #3 and #4 above  (circles in both armpits and the left groin)

## 2023-06-03 NOTE — Therapy (Signed)
 OUTPATIENT PHYSICAL THERAPY  UPPER EXTREMITY ONCOLOGY  Patient Name: Linda Wood MRN: [id number] DOB:[birth year], 68 y.o., female Today's Date: 06/03/2023  END OF SESSION:  PT End of Session - 06/03/23 1449     Visit Number 2    Number of Visits 12    Date for PT Re-Evaluation 07/09/23    PT Start Time 1402    PT Stop Time 1450    PT Time Calculation (min) 48 min    Activity Tolerance Patient tolerated treatment well    Behavior During Therapy WFL for tasks assessed/performed              Past Medical History:  Diagnosis Date   Anxiety    Breast cancer (HCC)    Depression    Hypertension    Rheumatoid arthritis (HCC)    Past Surgical History:  Procedure Laterality Date   BREAST BIOPSY Left 06/20/2022   US  LT BREAST BX W LOC DEV 1ST LESION IMG BX SPEC US  GUIDE 06/20/2022 [facility]   BREAST BIOPSY Left 06/20/2022   MM LT BREAST BX W LOC DEV 1ST LESION IMAGE BX SPEC STEREO GUIDE 06/20/2022 [facility]   BREAST BIOPSY  07/15/2022   MM LT RADIOACTIVE SEED LOC MAMMO GUIDE 07/15/2022 [facility]   BREAST BIOPSY  07/15/2022   MM LT RADIOACTIVE SEED EA ADD LESION LOC MAMMO GUIDE 07/15/2022 [facility]   BREAST LUMPECTOMY WITH RADIOACTIVE SEED AND SENTINEL LYMPH NODE BIOPSY Left 07/16/2022   Procedure: LEFT BREAST LUMPECTOMY WITH RADIOACTIVE SEED X2 AND AXILLARY SENTINEL LYMPH NODE BIOPSY;  Surgeon: Belinda Cough, MD;  Location: [facility];  Service: General;  Laterality: Left;   CATARACT EXTRACTION, BILATERAL     08/14/2022, 08/28/2022   GASTRIC BYPASS     HAND SURGERY Right    LASIK     RE-EXCISION OF BREAST LUMPECTOMY Left 07/24/2022   Procedure: RE-EXCISION OF LEFT BREAST LUMPECTOMY ANTERIOR AND LATERAL MARGINS;  Surgeon: Belinda Cough, MD;  Location: [facility];  Service: General;  Laterality: Left;  45 MIN ROOM 12   Patient Active Problem List   Diagnosis Date Noted   Genetic testing 07/05/2022   Family history of breast cancer 06/27/2022    Malignant neoplasm of upper-outer quadrant of left breast in female, estrogen receptor positive (HCC) 06/25/2022   Psychophysiological insomnia 11/28/2021   Rheumatoid arthritis (HCC) 10/29/2021   Essential hypertension 10/29/2021   Depression, major, single episode, complete remission (HCC) 10/29/2021   Rib fractures 10/15/2021    PCP:   REFERRING PROVIDER: Morna Kendall, NP  REFERRING DIAG:L  Breast lymphedema s/p surgery and radiation  THERAPY DIAG:  Aftercare following surgery for neoplasm  Malignant neoplasm of upper-outer quadrant of left breast in female, estrogen receptor positive (HCC)  Abnormal posture  Lymphedema of breast  ONSET DATE: December 2024  Rationale for Evaluation and Treatment: Rehabilitation  SUBJECTIVE:  SUBJECTIVE STATEMENT:  I got a prosthetic to make myself more even and I think that helped.  Done with antibiotics now.    EVAL: Pt with concerns of left breast pain and swelling which started about a month ago. When she was seen on 05/20/2023 by Morna Kendall the breast was tender, warm and erythematous. She was given Augmentin  for 10 days and was to schedule mammogram and US  for further evaluation. She has  2 more doses of Augmentin  left.  Very little change in Redness with Augmentin  and no change in pain. Swelling has reduced a little. It bothers her to sleep on her left side, and to reach high  PERTINENT HISTORY:   Patient was diagnosed on 06/20/2022 with left grade 2 invasive ductal carcinoma breast cancer. It measures 7 mm and is located in the upper outer quadrant. It is ER/PR positive and HER2 negative with a Ki67 of 10% . She had a double  left lumpectomy with SLNB on 07/16/2022, followed by a re-excision on 07/24/2022 to get clear margins. She had radiation from  09/09/2022-10/07/2022 and is currently taking Anastrozole .   PAIN:  Are you having pain? No  PRECAUTIONS: Left UE lymphedema risk, RA  RED FLAGS: None   WEIGHT BEARING RESTRICTIONS: No  FALLS:  Has patient fallen in last 6 months? No  LIVING ENVIRONMENT: Lives with: lives with their spouse Lives in: House/apartment   OCCUPATION: does not work  LEISURE: needlepoint, archivist  HAND DOMINANCE: right   PRIOR LEVEL OF FUNCTION: Independent  PATIENT GOALS: make swelling better, pain better   OBJECTIVE: Note: Objective measures were completed at Evaluation unless otherwise noted.  COGNITION: Overall cognitive status: Within functional limits for tasks assessed   PALPATION: Fibrosis noted inferior medial breast, no significant tenderness  OBSERVATIONS / OTHER ASSESSMENTS: pt is wearing a compression bra [employer]) with impressions from bra medial and lateral breast, redness noted with picture in media and present for 4 weeks. Unchanged from antibiotics.peau'd'orange inferior medial left breast with fibrosis, enlarged pores medially greater than laterally. Generalized breast swelling. Minimal warmth  SENSATION: Light touch: Deficits    POSTURE: forward head, rounded shoulders  UPPER EXTREMITY AROM/PROM:  A/PROM RIGHT   eval   Shoulder extension   Shoulder flexion 137  Shoulder abduction 130  Shoulder internal rotation   Shoulder external rotation     (Blank rows = not tested)  A/PROM LEFT   eval  Shoulder extension   Shoulder flexion 138, + in breast  Shoulder abduction 103  Shoulder internal rotation   Shoulder external rotation     (Blank rows = not tested)  CERVICAL AROM: All within functional limits:     UPPER EXTREMITY STRENGTH:   LYMPHEDEMA ASSESSMENTS:  SURGERY TYPE/DATE: double left lumpectomy with SLNB on 07/16/2022, followed by a re-excision on 07/24/2022 to get clear margins  NUMBER OF LYMPH NODES REMOVED: 0/2 CHEMOTHERAPY:  NO RADIATION:YES HORMONE TREATMENT: YES, Asastrozole INFECTIONS: has taken Augmentin  and has 1 and a half days left; no real change in redness. Redness present x 4 weeks per pt.  LYMPHEDEMA ASSESSMENTS:   LANDMARK RIGHT  eval  At axilla    15 cm proximal to olecranon process   10 cm proximal to olecranon process   Olecranon process   15 cm proximal to ulnar styloid process   10 cm proximal to ulnar styloid process   Just proximal to ulnar styloid process   Across hand at thumb web space   At base of 2nd digit   (  Blank rows = not tested)  LANDMARK LEFT  eval  At axilla    15 cm proximal to olecranon process   10 cm proximal to olecranon process   Olecranon process   15 cm proximal to ulnar styloid process   10 cm proximal to ulnar styloid process   Just proximal to ulnar styloid process   Across hand at thumb web space   At base of 2nd digit   (Blank rows = not tested)   FUNCTIONAL TESTS:    GAIT: WNL  L-DEX LYMPHEDEMA SCREENING: The patient was assessed using the L-Dex machine today to produce a lymphedema index baseline score. The patient will be reassessed on a regular basis (typically every 3 months) to obtain new L-Dex scores. If the score is > 6.5 points away from his/her baseline score indicating onset of subclinical lymphedema, it will be recommended to wear a compression garment for 4 weeks, 12 hours per day and then be reassessed. If the score continues to be > 6.5 points from baseline at reassessment, we will initiate lymphedema treatment. Assessing in this manner has a 95% rate of preventing clinically significant lymphedema. SCREEN 05/26/2023 -2.1    BREAST COMPLAINTS QUESTIONNAIRE Pain:5 Heaviness:9 Swollen feeling:9 Tense Skin:9 Redness:7 Bra Print:7 Size of Pores:9 Hard feeling:8  Total:   63  /80 A Score over 9 indicates lymphedema issues in the breast                                                                                                                          TREATMENT DATE:  Pt permission and consent throughout each step of examination and treatment with modification and draping if requested when working on sensitive areas  06/03/23 In supine: Short neck, 5 diaphragmatic breaths, bil axillary nodes and establishment of interaxillary pathway, L inguinal nodes and establishment of axilloinguinal pathway, then L breast moving fluid towards pathways spending extra time in any areas of fibrosis then retracing all steps while explaining to pt with vcs, tcs, and education on lymphatic physiology throughout.   05/28/2023 Showed pt diagram of lymphatics and explained gentleness of touch, importance of stretch,LN activation etc In supine: Short neck, 5 diaphragmatic breaths, R axillary nodes and establishment of interaxillary pathway, L inguinal nodes and establishment of axilloinguinal pathway, then L breast moving fluid towards pathways spending extra time in any areas of fibrosis then retracing all steps while explaining to pt. Photo taken and placed in Media and Morna Crawford PIETY messaged to look at photo. Feel redness more than likely from the swelling since it has been about the same for 4 weeks per pt. And with minimal warmth   PATIENT EDUCATION:  Education details: POC, treatment interventions, education in MLD Person educated: Patient Education method: Explanation, demonstration Education comprehension: verbalized understanding and needs further education  HOME EXERCISE PROGRAM: Breast MLD  ASSESSMENT:  CLINICAL IMPRESSION: No redness noted today. Breast is feeling better to pt already with new bra insert and increased wrinkles noted.  Started MLD education with handout.  OBJECTIVE IMPAIRMENTS: decreased knowledge of condition, decreased ROM, increased edema, impaired UE functional use, postural dysfunction, and pain.   ACTIVITY LIMITATIONS: sleeping, bed mobility, and reach over head  PARTICIPATION LIMITATIONS:  able to  participate in most but with breast swelling/discomfort  PERSONAL FACTORS: 1-2 comorbidities: Left breast cancer s/p radiation  are also affecting patient's functional outcome.   REHAB POTENTIAL: Good  CLINICAL DECISION MAKING: Stable/uncomplicated  EVALUATION COMPLEXITY: Low  GOALS: Goals reviewed with patient? Yes  SHORT TERM GOALS=LONG TERM GOALS: Target date: 07/09/2023  Pt will be independent with use of compression bra to decrease left breast swelling Baseline: Goal status: INITIAL  2.  Pt will be independent in left breast MLD to decrease swelling Baseline:  Goal status: INITIAL  3.  Pt will report breast pain atleast 50% better Baseline:  Goal status: INITIAL  4.  Pt will have breast complaints questionnaire improved to no greater than 20 to demonstrate improved swelling Baseline:  Goal status: INITIAL  5.  Pt will be able to reach with the left UE without left breast pain Baseline:  Goal status: INITIAL  6.  Pt will be able to sleep on the left side for short periods of time Baseline:  Goal status: INITIAL   PLAN:  PT FREQUENCY: 2x/week  PT DURATION: 6 weeks  PLANNED INTERVENTIONS: 97164- PT Re-evaluation, 97110-Therapeutic exercises, 97530- Therapeutic activity, 97112- Neuromuscular re-education, 97535- Self Care, [id number]- Manual therapy, and Patient/Family education  PLAN FOR NEXT SESSION: check breast redness, did she get another compression bra, continue MLD and education of patient, check left shoulder ROM (be aware of RA)  Houston Zapien R, PT 06/03/2023, 2:50 PM

## 2023-06-05 ENCOUNTER — Encounter: Payer: Self-pay | Admitting: Physical Therapy

## 2023-06-05 ENCOUNTER — Ambulatory Visit: Payer: Medicare HMO | Admitting: Physical Therapy

## 2023-06-05 DIAGNOSIS — Z170 Estrogen receptor positive status [ER+]: Secondary | ICD-10-CM | POA: Diagnosis not present

## 2023-06-05 DIAGNOSIS — R293 Abnormal posture: Secondary | ICD-10-CM | POA: Diagnosis not present

## 2023-06-05 DIAGNOSIS — Z483 Aftercare following surgery for neoplasm: Secondary | ICD-10-CM

## 2023-06-05 DIAGNOSIS — C50412 Malignant neoplasm of upper-outer quadrant of left female breast: Secondary | ICD-10-CM | POA: Diagnosis not present

## 2023-06-05 DIAGNOSIS — I890 Lymphedema, not elsewhere classified: Secondary | ICD-10-CM

## 2023-06-05 NOTE — Therapy (Signed)
 OUTPATIENT PHYSICAL THERAPY  UPPER EXTREMITY ONCOLOGY  Patient Name: Linda Wood MRN: [id number] DOB:April 06, 1956, 68 y.o., female Today's Date: 06/05/2023  END OF SESSION:  PT End of Session - 06/05/23 1406     Visit Number 3    Number of Visits 12    Date for PT Re-Evaluation 07/09/23    PT Start Time 1400    PT Stop Time 1453    PT Time Calculation (min) 53 min    Activity Tolerance Patient tolerated treatment well    Behavior During Therapy WFL for tasks assessed/performed               Past Medical History:  Diagnosis Date   Anxiety    Breast cancer (HCC)    Depression    Hypertension    Rheumatoid arthritis (HCC)    Past Surgical History:  Procedure Laterality Date   BREAST BIOPSY Left 06/20/2022   US  LT BREAST BX W LOC DEV 1ST LESION IMG BX SPEC US  GUIDE 06/20/2022 [facility]   BREAST BIOPSY Left 06/20/2022   MM LT BREAST BX W LOC DEV 1ST LESION IMAGE BX SPEC STEREO GUIDE 06/20/2022 [facility]   BREAST BIOPSY  07/15/2022   MM LT RADIOACTIVE SEED LOC MAMMO GUIDE 07/15/2022 [facility]   BREAST BIOPSY  07/15/2022   MM LT RADIOACTIVE SEED EA ADD LESION LOC MAMMO GUIDE 07/15/2022 [facility]   BREAST LUMPECTOMY WITH RADIOACTIVE SEED AND SENTINEL LYMPH NODE BIOPSY Left 07/16/2022   Procedure: LEFT BREAST LUMPECTOMY WITH RADIOACTIVE SEED X2 AND AXILLARY SENTINEL LYMPH NODE BIOPSY;  Surgeon: Belinda Cough, MD;  Location: [facility];  Service: General;  Laterality: Left;   CATARACT EXTRACTION, BILATERAL     08/14/2022, 08/28/2022   GASTRIC BYPASS     HAND SURGERY Right    LASIK     RE-EXCISION OF BREAST LUMPECTOMY Left 07/24/2022   Procedure: RE-EXCISION OF LEFT BREAST LUMPECTOMY ANTERIOR AND LATERAL MARGINS;  Surgeon: Belinda Cough, MD;  Location: [facility];  Service: General;  Laterality: Left;  45 MIN ROOM 12   Patient Active Problem List   Diagnosis Date Noted   Genetic testing 07/05/2022   Family history of breast cancer 06/27/2022    Malignant neoplasm of upper-outer quadrant of left breast in female, estrogen receptor positive (HCC) 06/25/2022   Psychophysiological insomnia 11/28/2021   Rheumatoid arthritis (HCC) 10/29/2021   Essential hypertension 10/29/2021   Depression, major, single episode, complete remission (HCC) 10/29/2021   Rib fractures 10/15/2021    PCP:   REFERRING PROVIDER: Morna Kendall, NP  REFERRING DIAG:L  Breast lymphedema s/p surgery and radiation  THERAPY DIAG:  Aftercare following surgery for neoplasm  Malignant neoplasm of upper-outer quadrant of left breast in female, estrogen receptor positive (HCC)  Abnormal posture  Lymphedema of breast  ONSET DATE: December 2024  Rationale for Evaluation and Treatment: Rehabilitation  SUBJECTIVE:  SUBJECTIVE STATEMENT:  Pt states she feels that she is less swollen. She wears her compression bra during the day.     EVAL: Pt with concerns of left breast pain and swelling which started about a month ago. When she was seen on 05/20/2023 by Morna Kendall the breast was tender, warm and erythematous. She was given Augmentin  for 10 days and was to schedule mammogram and US  for further evaluation. She has  2 more doses of Augmentin  left.  Very little change in Redness with Augmentin  and no change in pain. Swelling has reduced a little. It bothers her to sleep on her left side, and to reach high  PERTINENT HISTORY:   Patient was diagnosed on 06/20/2022 with left grade 2 invasive ductal carcinoma breast cancer. It measures 7 mm and is located in the upper outer quadrant. It is ER/PR positive and HER2 negative with a Ki67 of 10% . She had a double  left lumpectomy with SLNB on 07/16/2022, followed by a re-excision on 07/24/2022 to get clear margins. She had radiation from  09/09/2022-10/07/2022 and is currently taking Anastrozole .   PAIN:  Are you having pain? No  PRECAUTIONS: Left UE lymphedema risk, RA  RED FLAGS: None   WEIGHT BEARING RESTRICTIONS: No  FALLS:  Has patient fallen in last 6 months? No  LIVING ENVIRONMENT: Lives with: lives with their spouse Lives in: House/apartment   OCCUPATION: does not work  LEISURE: needlepoint, archivist  HAND DOMINANCE: right   PRIOR LEVEL OF FUNCTION: Independent  PATIENT GOALS: make swelling better, pain better   OBJECTIVE: Note: Objective measures were completed at Evaluation unless otherwise noted.  COGNITION: Overall cognitive status: Within functional limits for tasks assessed   PALPATION: Fibrosis noted inferior medial breast, no significant tenderness  OBSERVATIONS / OTHER ASSESSMENTS: pt is wearing a compression bra [facility]) with impressions from bra medial and lateral breast, redness noted with picture in media and present for 4 weeks. Unchanged from antibiotics.peau'd'orange inferior medial left breast with fibrosis, enlarged pores medially greater than laterally. Generalized breast swelling. Minimal warmth  SENSATION: Light touch: Deficits    POSTURE: forward head, rounded shoulders  UPPER EXTREMITY AROM/PROM:  A/PROM RIGHT   eval   Shoulder extension   Shoulder flexion 137  Shoulder abduction 130  Shoulder internal rotation   Shoulder external rotation     (Blank rows = not tested)  A/PROM LEFT   eval  Shoulder extension   Shoulder flexion 138, + in breast  Shoulder abduction 103  Shoulder internal rotation   Shoulder external rotation     (Blank rows = not tested)  CERVICAL AROM: All within functional limits:     UPPER EXTREMITY STRENGTH:   LYMPHEDEMA ASSESSMENTS:  SURGERY TYPE/DATE: double left lumpectomy with SLNB on 07/16/2022, followed by a re-excision on 07/24/2022 to get clear margins  NUMBER OF LYMPH NODES REMOVED: 0/2 CHEMOTHERAPY:  NO RADIATION:YES HORMONE TREATMENT: YES, Asastrozole INFECTIONS: has taken Augmentin  and has 1 and a half days left; no real change in redness. Redness present x 4 weeks per pt.  LYMPHEDEMA ASSESSMENTS:   LANDMARK RIGHT  eval  At axilla    15 cm proximal to olecranon process   10 cm proximal to olecranon process   Olecranon process   15 cm proximal to ulnar styloid process   10 cm proximal to ulnar styloid process   Just proximal to ulnar styloid process   Across hand at thumb web space   At base of 2nd digit   (Blank  rows = not tested)  LANDMARK LEFT  eval  At axilla    15 cm proximal to olecranon process   10 cm proximal to olecranon process   Olecranon process   15 cm proximal to ulnar styloid process   10 cm proximal to ulnar styloid process   Just proximal to ulnar styloid process   Across hand at thumb web space   At base of 2nd digit   (Blank rows = not tested)   FUNCTIONAL TESTS:    GAIT: WNL  L-DEX LYMPHEDEMA SCREENING: The patient was assessed using the L-Dex machine today to produce a lymphedema index baseline score. The patient will be reassessed on a regular basis (typically every 3 months) to obtain new L-Dex scores. If the score is > 6.5 points away from his/her baseline score indicating onset of subclinical lymphedema, it will be recommended to wear a compression garment for 4 weeks, 12 hours per day and then be reassessed. If the score continues to be > 6.5 points from baseline at reassessment, we will initiate lymphedema treatment. Assessing in this manner has a 95% rate of preventing clinically significant lymphedema. SCREEN 05/26/2023 -2.1    BREAST COMPLAINTS QUESTIONNAIRE Pain:5 Heaviness:9 Swollen feeling:9 Tense Skin:9 Redness:7 Bra Print:7 Size of Pores:9 Hard feeling:8  Total:   63  /80 A Score over 9 indicates lymphedema issues in the breast                                                                                                                          TREATMENT DATE:  Pt permission and consent throughout each step of examination and treatment with modification and draping if requested when working on sensitive areas  06/05/2023 MLD as outlined below. Instructed in self MLD technique with both hands on sternum moving toward right axilla and at left lateral chest toward left inguinal. Pt able to repeat after instruction with verbal cues and hand over hand insturction  Reinforced remedial ROM of dowel flexion with deep breathing .  06/03/23 In supine: Short neck, 5 diaphragmatic breaths, bil axillary nodes and establishment of interaxillary pathway, L inguinal nodes and establishment of axilloinguinal pathway, then L breast moving fluid towards pathways spending extra time in any areas of fibrosis then retracing all steps while explaining to pt with vcs, tcs, and education on lymphatic physiology throughout.   05/28/2023 Showed pt diagram of lymphatics and explained gentleness of touch, importance of stretch,LN activation etc In supine: Short neck, 5 diaphragmatic breaths, R axillary nodes and establishment of interaxillary pathway, L inguinal nodes and establishment of axilloinguinal pathway, then L breast moving fluid towards pathways spending extra time in any areas of fibrosis then retracing all steps while explaining to pt. Photo taken and placed in Media and Morna Crawford PIETY messaged to look at photo. Feel redness more than likely from the swelling since it has been about the same for 4 weeks per pt. And with minimal warmth   PATIENT EDUCATION:  Education  details: POC, treatment interventions, education in MLD Person educated: Patient Education method: Explanation, demonstration Education comprehension: verbalized understanding and needs further education  HOME EXERCISE PROGRAM: Breast MLD  ASSESSMENT:  CLINICAL IMPRESSION: No redness noted today. Breast is feeling better to pt already with new bra insert and  increased wrinkles noted. Started MLD education with handout.  OBJECTIVE IMPAIRMENTS: decreased knowledge of condition, decreased ROM, increased edema, impaired UE functional use, postural dysfunction, and pain.   ACTIVITY LIMITATIONS: sleeping, bed mobility, and reach over head  PARTICIPATION LIMITATIONS:  able to participate in most but with breast swelling/discomfort  PERSONAL FACTORS: 1-2 comorbidities: Left breast cancer s/p radiation  are also affecting patient's functional outcome.   REHAB POTENTIAL: Good  CLINICAL DECISION MAKING: Stable/uncomplicated  EVALUATION COMPLEXITY: Low  GOALS: Goals reviewed with patient? Yes  SHORT TERM GOALS=LONG TERM GOALS: Target date: 07/09/2023  Pt will be independent with use of compression bra to decrease left breast swelling Baseline: Goal status: INITIAL  2.  Pt will be independent in left breast MLD to decrease swelling Baseline:  Goal status: INITIAL  3.  Pt will report breast pain atleast 50% better Baseline:  Goal status: INITIAL  4.  Pt will have breast complaints questionnaire improved to no greater than 20 to demonstrate improved swelling Baseline:  Goal status: INITIAL  5.  Pt will be able to reach with the left UE without left breast pain Baseline:  Goal status: INITIAL  6.  Pt will be able to sleep on the left side for short periods of time Baseline:  Goal status: INITIAL   PLAN:  PT FREQUENCY: 2x/week  PT DURATION: 6 weeks  PLANNED INTERVENTIONS: 97164- PT Re-evaluation, 97110-Therapeutic exercises, 97530- Therapeutic activity, 97112- Neuromuscular re-education, 97535- Self Care, 02859- Manual therapy, and Patient/Family education  PLAN FOR NEXT SESSION: check breast redness, did she get another compression bra, continue MLD and education of patient, check left shoulder ROM (be aware of RA) Carleena Mires K. Delores PT  Delores Verneita Armor, PT 06/05/2023, 2:58 PM [facility] [address] [place], [place], [address] Phone: [phone]   Fax:  [phone]  Patient Details  Name: Linda Wood MRN: [id number] Date of Birth: Aug 28, 1955 Referring Provider:  Crawford Jacobsen Cornett*  Encounter Date: 06/05/2023  Verneita POUR. Delores PT  Delores Verneita Armor, PT 06/05/2023, 2:58 PM  [facility] [address] [place], [place], [address] Phone: [phone]   Fax:  [phone]

## 2023-06-10 ENCOUNTER — Encounter: Payer: Medicare HMO | Admitting: Rehabilitation

## 2023-06-12 ENCOUNTER — Encounter: Payer: Medicare HMO | Admitting: Rehabilitation

## 2023-06-17 ENCOUNTER — Encounter: Payer: Medicare HMO | Admitting: Rehabilitation

## 2023-06-19 ENCOUNTER — Other Ambulatory Visit (HOSPITAL_BASED_OUTPATIENT_CLINIC_OR_DEPARTMENT_OTHER): Payer: Self-pay

## 2023-06-19 ENCOUNTER — Other Ambulatory Visit: Payer: Self-pay | Admitting: Family Medicine

## 2023-06-19 ENCOUNTER — Other Ambulatory Visit: Payer: Self-pay

## 2023-06-19 ENCOUNTER — Encounter: Payer: Medicare HMO | Admitting: Rehabilitation

## 2023-06-19 DIAGNOSIS — I10 Essential (primary) hypertension: Secondary | ICD-10-CM

## 2023-06-19 DIAGNOSIS — F5104 Psychophysiologic insomnia: Secondary | ICD-10-CM

## 2023-06-19 MED ORDER — TRAZODONE HCL 50 MG PO TABS
25.0000 mg | ORAL_TABLET | Freq: Every evening | ORAL | 0 refills | Status: DC | PRN
  Filled 2023-06-19: qty 90, 90d supply, fill #0

## 2023-06-19 MED ORDER — LOSARTAN POTASSIUM 50 MG PO TABS
50.0000 mg | ORAL_TABLET | Freq: Every morning | ORAL | 0 refills | Status: DC
  Filled 2023-06-19: qty 90, 90d supply, fill #0

## 2023-06-24 ENCOUNTER — Ambulatory Visit (INDEPENDENT_AMBULATORY_CARE_PROVIDER_SITE_OTHER): Payer: Medicare HMO

## 2023-06-24 DIAGNOSIS — C50412 Malignant neoplasm of upper-outer quadrant of left female breast: Secondary | ICD-10-CM

## 2023-06-24 DIAGNOSIS — I890 Lymphedema, not elsewhere classified: Secondary | ICD-10-CM

## 2023-06-24 DIAGNOSIS — R293 Abnormal posture: Secondary | ICD-10-CM | POA: Diagnosis not present

## 2023-06-24 DIAGNOSIS — Z483 Aftercare following surgery for neoplasm: Secondary | ICD-10-CM | POA: Diagnosis not present

## 2023-06-24 DIAGNOSIS — Z170 Estrogen receptor positive status [ER+]: Secondary | ICD-10-CM | POA: Diagnosis not present

## 2023-06-24 NOTE — Therapy (Signed)
 OUTPATIENT PHYSICAL THERAPY  UPPER EXTREMITY ONCOLOGY  Patient Name: Linda Wood MRN: [id number] DOB:[birth year], 68 y.o., female Today's Date: 06/24/2023  END OF SESSION:  PT End of Session - 06/24/23 1200     Visit Number 4    Number of Visits 12    Date for PT Re-Evaluation 07/09/23    Authorization Type [employer]    PT Start Time 1201    PT Stop Time 1254    PT Time Calculation (min) 53 min    Activity Tolerance Patient tolerated treatment well    Behavior During Therapy WFL for tasks assessed/performed               Past Medical History:  Diagnosis Date   Anxiety    Breast cancer (HCC)    Depression    Hypertension    Rheumatoid arthritis (HCC)    Past Surgical History:  Procedure Laterality Date   BREAST BIOPSY Left 06/20/2022   [place] LT BREAST BX W LOC DEV 1ST LESION IMG BX SPEC US GUIDE 06/20/2022 [facility]   BREAST BIOPSY Left 06/20/2022   MM LT BREAST BX W LOC DEV 1ST LESION IMAGE BX SPEC STEREO GUIDE 06/20/2022 [facility]   BREAST BIOPSY  07/15/2022   MM LT RADIOACTIVE SEED LOC MAMMO GUIDE 07/15/2022 [facility]   BREAST BIOPSY  07/15/2022   MM LT RADIOACTIVE SEED EA ADD LESION LOC MAMMO GUIDE 07/15/2022 [facility]   BREAST LUMPECTOMY WITH RADIOACTIVE SEED AND SENTINEL LYMPH NODE BIOPSY Left 07/16/2022   Procedure: LEFT BREAST LUMPECTOMY WITH RADIOACTIVE SEED X2 AND AXILLARY SENTINEL LYMPH NODE BIOPSY;  Surgeon: Manus Rudd, MD;  Location: [facility] OR;  Service: General;  Laterality: Left;   CATARACT EXTRACTION, BILATERAL     08/14/2022, 08/28/2022   GASTRIC BYPASS     HAND SURGERY Right    LASIK     RE-EXCISION OF BREAST LUMPECTOMY Left 07/24/2022   Procedure: RE-EXCISION OF LEFT BREAST LUMPECTOMY ANTERIOR AND LATERAL MARGINS;  Surgeon: Manus Rudd, MD;  Location: [facility] OR;  Service: General;  Laterality: Left;  45 MIN ROOM 12   Patient Active Problem List   Diagnosis Date Noted   Genetic testing 07/05/2022   Family history of  breast cancer 06/27/2022   Malignant neoplasm of upper-outer quadrant of left breast in female, estrogen receptor positive (HCC) 06/25/2022   Psychophysiological insomnia 11/28/2021   Rheumatoid arthritis (HCC) 10/29/2021   Essential hypertension 10/29/2021   Depression, major, single episode, complete remission (HCC) 10/29/2021   Rib fractures 10/15/2021    PCP:   REFERRING PROVIDER: Lillard Anes, NP  REFERRING DIAG:L  Breast lymphedema s/p surgery and radiation  THERAPY DIAG:  Aftercare following surgery for neoplasm  Malignant neoplasm of upper-outer quadrant of left breast in female, estrogen receptor positive (HCC)  Abnormal posture  Lymphedema of breast  ONSET DATE: December 2024  Rationale for Evaluation and Treatment: Rehabilitation  SUBJECTIVE:  SUBJECTIVE STATEMENT:   I can sleep on my stomach now. The swelling is better, but its not gone. I have been sick for 2 weeks so I havent done anything. I am trying to wear my compressio bra.  EVAL: Pt with concerns of left breast pain and swelling which started about a month ago. When she was seen on 05/20/2023 by Lillard Anes the breast was tender, warm and erythematous. She was given Augmentin for 10 days and was to schedule mammogram and [place] for further evaluation. She has  2 more doses of Augmentin left.  Very little change in Redness with Augmentin and no change in pain. Swelling has reduced a little. It bothers her to sleep on her left side, and to reach high  PERTINENT HISTORY:   Patient was diagnosed on 06/20/2022 with left grade 2 invasive ductal carcinoma breast cancer. It measures 7 mm and is located in the upper outer quadrant. It is ER/PR positive and HER2 negative with a Ki67 of 10% . She had a double  left lumpectomy with SLNB on  07/16/2022, followed by a re-excision on 07/24/2022 to get clear margins. She had radiation from 09/09/2022-10/07/2022 and is currently taking Anastrozole.   PAIN:  Are you having pain? No  PRECAUTIONS: Left UE lymphedema risk, RA  RED FLAGS: None   WEIGHT BEARING RESTRICTIONS: No  FALLS:  Has patient fallen in last 6 months? No  LIVING ENVIRONMENT: Lives with: lives with their spouse Lives in: House/apartment   OCCUPATION: does not work  LEISURE: needlepoint, Archivist  HAND DOMINANCE: right   PRIOR LEVEL OF FUNCTION: Independent  PATIENT GOALS: make swelling better, pain better   OBJECTIVE: Note: Objective measures were completed at Evaluation unless otherwise noted.  COGNITION: Overall cognitive status: Within functional limits for tasks assessed   PALPATION: Fibrosis noted inferior medial breast, no significant tenderness  OBSERVATIONS / OTHER ASSESSMENTS: pt is wearing a compression bra [facility]) with impressions from bra medial and lateral breast, redness noted with picture in media and present for 4 weeks. Unchanged from antibiotics.peau'd'orange inferior medial left breast with fibrosis, enlarged pores medially greater than laterally. Generalized breast swelling. Minimal warmth  SENSATION: Light touch: Deficits    POSTURE: forward head, rounded shoulders  UPPER EXTREMITY AROM/PROM:  A/PROM RIGHT   eval   Shoulder extension   Shoulder flexion 137  Shoulder abduction 130  Shoulder internal rotation   Shoulder external rotation     (Blank rows = not tested)  A/PROM LEFT   eval  Shoulder extension   Shoulder flexion 138, + in breast  Shoulder abduction 103  Shoulder internal rotation   Shoulder external rotation     (Blank rows = not tested)  CERVICAL AROM: All within functional limits:     UPPER EXTREMITY STRENGTH:   LYMPHEDEMA ASSESSMENTS:  SURGERY TYPE/DATE: double left lumpectomy with SLNB on 07/16/2022, followed by a re-excision on  07/24/2022 to get clear margins  NUMBER OF LYMPH NODES REMOVED: 0/2 CHEMOTHERAPY: NO RADIATION:YES HORMONE TREATMENT: YES, Asastrozole INFECTIONS: has taken Augmentin and has 1 and a half days left; no real change in redness. Redness present x 4 weeks per pt.  LYMPHEDEMA ASSESSMENTS:   LANDMARK RIGHT  eval  At axilla    15 cm proximal to olecranon process   10 cm proximal to olecranon process   Olecranon process   15 cm proximal to ulnar styloid process   10 cm proximal to ulnar styloid process   Just proximal to ulnar styloid process  Across hand at thumb web space   At base of 2nd digit   (Blank rows = not tested)  LANDMARK LEFT  eval  At axilla    15 cm proximal to olecranon process   10 cm proximal to olecranon process   Olecranon process   15 cm proximal to ulnar styloid process   10 cm proximal to ulnar styloid process   Just proximal to ulnar styloid process   Across hand at thumb web space   At base of 2nd digit   (Blank rows = not tested)   FUNCTIONAL TESTS:    GAIT: WNL  L-DEX LYMPHEDEMA SCREENING: The patient was assessed using the L-Dex machine today to produce a lymphedema index baseline score. The patient will be reassessed on a regular basis (typically every 3 months) to obtain new L-Dex scores. If the score is > 6.5 points away from his/her baseline score indicating onset of subclinical lymphedema, it will be recommended to wear a compression garment for 4 weeks, 12 hours per day and then be reassessed. If the score continues to be > 6.5 points from baseline at reassessment, we will initiate lymphedema treatment. Assessing in this manner has a 95% rate of preventing clinically significant lymphedema. SCREEN 05/26/2023 -2.1    BREAST COMPLAINTS QUESTIONNAIRE Pain:5 Heaviness:9 Swollen feeling:9 Tense Skin:9 Redness:7 Bra Print:7 Size of Pores:9 Hard feeling:8  Total:   63  /80 A Score over 9 indicates lymphedema issues in the breast                                                                                                                          TREATMENT DATE:  Pt permission and consent throughout each step of examination and treatment with modification and draping if requested when working on sensitive areas 06/24/2023 No redness noted left breast In supine: Short neck, 5 diaphragmatic breaths, bil axillary nodes and establishment of interaxillary pathway, L inguinal nodes and establishment of axilloinguinal pathway, then L breast moving fluid towards pathways spending extra time in any areas of fibrosis then retracing all steps while explaining to pt with vcs, tcs, and education on lymphatic physiology throughout. Therapist performed all steps and after demonstration pt performed all steps. She required VC's and TC's initially for stretch and to decrease pressure but improved greatly by the end. She did have difficulty reaching the lateral breast. Spaghetti foam pad made and pt assisted with placing in compression bra to try and decrease swelling. We discussed trying to wear her bra 24/7 if possible to try and reduce edema.     06/05/2023 MLD as outlined below. Instructed in self MLD technique with both hands on sternum moving toward right axilla and at left lateral chest toward left inguinal. Pt able to repeat after instruction with verbal cues and hand over hand insturction  Reinforced remedial ROM of dowel flexion with deep breathing .  06/03/23 In supine: Short neck, 5 diaphragmatic breaths, bil axillary nodes and establishment of interaxillary  pathway, L inguinal nodes and establishment of axilloinguinal pathway, then L breast moving fluid towards pathways spending extra time in any areas of fibrosis then retracing all steps while explaining to pt with vcs, tcs, and education on lymphatic physiology throughout.   05/28/2023 Showed pt diagram of lymphatics and explained gentleness of touch, importance of stretch,LN activation etc In  supine: Short neck, 5 diaphragmatic breaths, R axillary nodes and establishment of interaxillary pathway, L inguinal nodes and establishment of axilloinguinal pathway, then L breast moving fluid towards pathways spending extra time in any areas of fibrosis then retracing all steps while explaining to pt. Photo taken and placed in Media and Charlie Pitter messaged to look at photo. Feel redness more than likely from the swelling since it has been about the same for 4 weeks per pt. And with minimal warmth   PATIENT EDUCATION:  Education details: POC, treatment interventions, education in MLD Person educated: Patient Education method: Explanation, demonstration Education comprehension: verbalized understanding and needs further education  HOME EXERCISE PROGRAM: Breast MLD  ASSESSMENT:  CLINICAL IMPRESSION: Pt with significant enlarged pores today and medial fibrosis. Continued education in MLD and pt improved greatly with technique from start to finish. She had difficulty reaching the lateral breast and we discussed trying in sitting or right SL to see if that helps. Spaghetti foam made for inside bra.  OBJECTIVE IMPAIRMENTS: decreased knowledge of condition, decreased ROM, increased edema, impaired UE functional use, postural dysfunction, and pain.   ACTIVITY LIMITATIONS: sleeping, bed mobility, and reach over head  PARTICIPATION LIMITATIONS:  able to participate in most but with breast swelling/discomfort  PERSONAL FACTORS: 1-2 comorbidities: Left breast cancer s/p radiation  are also affecting patient's functional outcome.   REHAB POTENTIAL: Good  CLINICAL DECISION MAKING: Stable/uncomplicated  EVALUATION COMPLEXITY: Low  GOALS: Goals reviewed with patient? Yes  SHORT TERM GOALS=LONG TERM GOALS: Target date: 07/09/2023  Pt will be independent with use of compression bra to decrease left breast swelling Baseline: Goal status: INITIAL  2.  Pt will be independent in left  breast MLD to decrease swelling Baseline:  Goal status: INITIAL  3.  Pt will report breast pain atleast 50% better Baseline:  Goal status: INITIAL  4.  Pt will have breast complaints questionnaire improved to no greater than 20 to demonstrate improved swelling Baseline:  Goal status: INITIAL  5.  Pt will be able to reach with the left UE without left breast pain Baseline:  Goal status: INITIAL  6.  Pt will be able to sleep on the left side for short periods of time Baseline:  Goal status: INITIAL   PLAN:  PT FREQUENCY: 2x/week  PT DURATION: 6 weeks  PLANNED INTERVENTIONS: 97164- PT Re-evaluation, 97110-Therapeutic exercises, 97530- Therapeutic activity, 97112- Neuromuscular re-education, 97535- Self Care, 16109- Manual therapy, and Patient/Family education  PLAN FOR NEXT SESSION: check breast redness, did she get another compression bra, continue MLD and education of patient, check left shoulder ROM (be aware of RA) [facility] [address] [place], [place], [address] Phone: [phone]   Fax:  [phone]  Patient Details  Name: Linda Wood MRN: [id number] Date of Birth: 11/11/1955 Referring Provider:  Lillard Anes [place]*  Encounter Date: 06/24/2023  Waynette Buttery, PT 06/24/2023, 12:55 PM  [facility] [address] [place], [place], [address] Phone: [phone]   Fax:  [phone]

## 2023-06-26 ENCOUNTER — Ambulatory Visit: Payer: Medicare HMO

## 2023-06-26 DIAGNOSIS — R293 Abnormal posture: Secondary | ICD-10-CM | POA: Diagnosis not present

## 2023-06-26 DIAGNOSIS — I890 Lymphedema, not elsewhere classified: Secondary | ICD-10-CM | POA: Diagnosis not present

## 2023-06-26 DIAGNOSIS — C50412 Malignant neoplasm of upper-outer quadrant of left female breast: Secondary | ICD-10-CM

## 2023-06-26 DIAGNOSIS — Z170 Estrogen receptor positive status [ER+]: Secondary | ICD-10-CM | POA: Diagnosis not present

## 2023-06-26 DIAGNOSIS — Z483 Aftercare following surgery for neoplasm: Secondary | ICD-10-CM

## 2023-06-26 NOTE — Therapy (Signed)
 OUTPATIENT PHYSICAL THERAPY  UPPER EXTREMITY ONCOLOGY  Patient Name: Linda Wood MRN: [id number] DOB:Jan 18, 1956, 68 y.o., female Today's Date: 06/26/2023  END OF SESSION:  PT End of Session - 06/26/23 1103     Visit Number 5    Number of Visits 12    Date for PT Re-Evaluation 07/09/23    PT Start Time 1103    PT Stop Time 1156    PT Time Calculation (min) 53 min    Activity Tolerance Patient tolerated treatment well    Behavior During Therapy WFL for tasks assessed/performed               Past Medical History:  Diagnosis Date   Anxiety    Breast cancer (HCC)    Depression    Hypertension    Rheumatoid arthritis (HCC)    Past Surgical History:  Procedure Laterality Date   BREAST BIOPSY Left 06/20/2022   [place] LT BREAST BX W LOC DEV 1ST LESION IMG BX SPEC US GUIDE 06/20/2022 [facility]   BREAST BIOPSY Left 06/20/2022   MM LT BREAST BX W LOC DEV 1ST LESION IMAGE BX SPEC STEREO GUIDE 06/20/2022 [facility]   BREAST BIOPSY  07/15/2022   MM LT RADIOACTIVE SEED LOC MAMMO GUIDE 07/15/2022 [facility]   BREAST BIOPSY  07/15/2022   MM LT RADIOACTIVE SEED EA ADD LESION LOC MAMMO GUIDE 07/15/2022 [facility]   BREAST LUMPECTOMY WITH RADIOACTIVE SEED AND SENTINEL LYMPH NODE BIOPSY Left 07/16/2022   Procedure: LEFT BREAST LUMPECTOMY WITH RADIOACTIVE SEED X2 AND AXILLARY SENTINEL LYMPH NODE BIOPSY;  Surgeon: Manus Rudd, MD;  Location: [facility];  Service: General;  Laterality: Left;   CATARACT EXTRACTION, BILATERAL     08/14/2022, 08/28/2022   GASTRIC BYPASS     HAND SURGERY Right    LASIK     RE-EXCISION OF BREAST LUMPECTOMY Left 07/24/2022   Procedure: RE-EXCISION OF LEFT BREAST LUMPECTOMY ANTERIOR AND LATERAL MARGINS;  Surgeon: Manus Rudd, MD;  Location: [facility] OR;  Service: General;  Laterality: Left;  45 MIN ROOM 12   Patient Active Problem List   Diagnosis Date Noted   Genetic testing 07/05/2022   Family history of breast cancer 06/27/2022    Malignant neoplasm of upper-outer quadrant of left breast in female, estrogen receptor positive (HCC) 06/25/2022   Psychophysiological insomnia 11/28/2021   Rheumatoid arthritis (HCC) 10/29/2021   Essential hypertension 10/29/2021   Depression, major, single episode, complete remission (HCC) 10/29/2021   Rib fractures 10/15/2021    PCP:   REFERRING PROVIDER: Lillard Anes, NP  REFERRING DIAG:L  Breast lymphedema s/p surgery and radiation  THERAPY DIAG:  Aftercare following surgery for neoplasm  Malignant neoplasm of upper-outer quadrant of left breast in female, estrogen receptor positive (HCC)  Abnormal posture  Lymphedema of breast  ONSET DATE: December 2024  Rationale for Evaluation and Treatment: Rehabilitation  SUBJECTIVE:  SUBJECTIVE STATEMENT:   The breast doesn't bother me, it wasn't bothering me before though. The doctor wanted me to come. I've been doing the self MLD when I think of it but not as much as I should. I got the compression bra and have been wearing that because Robin told me I needed to.   EVAL: Pt with concerns of left breast pain and swelling which started about a month ago. When she was seen on 05/20/2023 by Lillard Anes the breast was tender, warm and erythematous. She was given Augmentin for 10 days and was to schedule mammogram and [place] for further evaluation. She has  2 more doses of Augmentin left.  Very little change in Redness with Augmentin and no change in pain. Swelling has reduced a little. It bothers her to sleep on her left side, and to reach high  PERTINENT HISTORY:   Patient was diagnosed on 06/20/2022 with left grade 2 invasive ductal carcinoma breast cancer. It measures 7 mm and is located in the upper outer quadrant. It is ER/PR positive and HER2 negative  with a Ki67 of 10% . She had a double  left lumpectomy with SLNB on 07/16/2022, followed by a re-excision on 07/24/2022 to get clear margins. She had radiation from 09/09/2022-10/07/2022 and is currently taking Anastrozole.   PAIN:  Are you having pain? No  PRECAUTIONS: Left UE lymphedema risk, RA  RED FLAGS: None   WEIGHT BEARING RESTRICTIONS: No  FALLS:  Has patient fallen in last 6 months? No  LIVING ENVIRONMENT: Lives with: lives with their spouse Lives in: House/apartment   OCCUPATION: does not work  LEISURE: needlepoint, Archivist  HAND DOMINANCE: right   PRIOR LEVEL OF FUNCTION: Independent  PATIENT GOALS: make swelling better, pain better   OBJECTIVE: Note: Objective measures were completed at Evaluation unless otherwise noted.  COGNITION: Overall cognitive status: Within functional limits for tasks assessed   PALPATION: Fibrosis noted inferior medial breast, no significant tenderness  OBSERVATIONS / OTHER ASSESSMENTS: pt is wearing a compression bra [facility]) with impressions from bra medial and lateral breast, redness noted with picture in media and present for 4 weeks. Unchanged from antibiotics.peau'd'orange inferior medial left breast with fibrosis, enlarged pores medially greater than laterally. Generalized breast swelling. Minimal warmth  SENSATION: Light touch: Deficits    POSTURE: forward head, rounded shoulders  UPPER EXTREMITY AROM/PROM:  A/PROM RIGHT   eval   Shoulder extension   Shoulder flexion 137  Shoulder abduction 130  Shoulder internal rotation   Shoulder external rotation     (Blank rows = not tested)  A/PROM LEFT   eval  Shoulder extension   Shoulder flexion 138, + in breast  Shoulder abduction 103  Shoulder internal rotation   Shoulder external rotation     (Blank rows = not tested)  CERVICAL AROM: All within functional limits:     UPPER EXTREMITY STRENGTH:   LYMPHEDEMA ASSESSMENTS:  SURGERY TYPE/DATE: double left  lumpectomy with SLNB on 07/16/2022, followed by a re-excision on 07/24/2022 to get clear margins  NUMBER OF LYMPH NODES REMOVED: 0/2 CHEMOTHERAPY: NO RADIATION:YES HORMONE TREATMENT: YES, Asastrozole INFECTIONS: has taken Augmentin and has 1 and a half days left; no real change in redness. Redness present x 4 weeks per pt.  LYMPHEDEMA ASSESSMENTS:   LANDMARK RIGHT  eval  At axilla    15 cm proximal to olecranon process   10 cm proximal to olecranon process   Olecranon process   15 cm proximal to ulnar styloid process  10 cm proximal to ulnar styloid process   Just proximal to ulnar styloid process   Across hand at thumb web space   At base of 2nd digit   (Blank rows = not tested)  LANDMARK LEFT  eval  At axilla    15 cm proximal to olecranon process   10 cm proximal to olecranon process   Olecranon process   15 cm proximal to ulnar styloid process   10 cm proximal to ulnar styloid process   Just proximal to ulnar styloid process   Across hand at thumb web space   At base of 2nd digit   (Blank rows = not tested)   FUNCTIONAL TESTS:    GAIT: WNL  L-DEX LYMPHEDEMA SCREENING: The patient was assessed using the L-Dex machine today to produce a lymphedema index baseline score. The patient will be reassessed on a regular basis (typically every 3 months) to obtain new L-Dex scores. If the score is > 6.5 points away from his/her baseline score indicating onset of subclinical lymphedema, it will be recommended to wear a compression garment for 4 weeks, 12 hours per day and then be reassessed. If the score continues to be > 6.5 points from baseline at reassessment, we will initiate lymphedema treatment. Assessing in this manner has a 95% rate of preventing clinically significant lymphedema. SCREEN 05/26/2023 -2.1    BREAST COMPLAINTS QUESTIONNAIRE Pain:5 Heaviness:9 Swollen feeling:9 Tense Skin:9 Redness:7 Bra Print:7 Size of Pores:9 Hard feeling:8  Total:   63  /80 A Score  over 9 indicates lymphedema issues in the breast                                                                                                                         TREATMENT DATE:  Pt permission and consent throughout each step of examination and treatment with modification and draping if requested when working on sensitive areas  06/26/23: Very mild redness noted that pt reports is normal.  In supine: Short neck, superficial and deep abdominals, Rt axillary and pectoral nodes, Rt intact anterior sequence and establishment of anterior inter-axillary anastomosis, Lt inguinal nodes and establishment of Lt axillo-inguinal anastomosis, then Lt medial and superior breast redirecting towards anterior anastomosis, then into Rt S/L for focus to lateral and inferior breast redirecting towards lateral anastomosis and then finished in supine retracing all steps redirecting towards anastomoses. Reviewed basics of anatomy with pt and reasoning of being consistent with self MLD being done daily is that by decreasing lymphedema can decrease her overall risk of developing cellulitis.   06/24/2023 No redness noted left breast In supine: Short neck, 5 diaphragmatic breaths, bil axillary nodes and establishment of interaxillary pathway, L inguinal nodes and establishment of axilloinguinal pathway, then L breast moving fluid towards pathways spending extra time in any areas of fibrosis then retracing all steps while explaining to pt with vcs, tcs, and education on lymphatic physiology throughout. Therapist performed all steps and after demonstration pt performed all steps. She  required VC's and TC's initially for stretch and to decrease pressure but improved greatly by the end. She did have difficulty reaching the lateral breast. Spaghetti foam pad made and pt assisted with placing in compression bra to try and decrease swelling. We discussed trying to wear her bra 24/7 if possible to try and reduce edema.      06/05/2023 MLD as outlined below. Instructed in self MLD technique with both hands on sternum moving toward right axilla and at left lateral chest toward left inguinal. Pt able to repeat after instruction with verbal cues and hand over hand insturction  Reinforced remedial ROM of dowel flexion with deep breathing .  06/03/23 In supine: Short neck, 5 diaphragmatic breaths, bil axillary nodes and establishment of interaxillary pathway, L inguinal nodes and establishment of axilloinguinal pathway, then L breast moving fluid towards pathways spending extra time in any areas of fibrosis then retracing all steps while explaining to pt with vcs, tcs, and education on lymphatic physiology throughout.   05/28/2023 Showed pt diagram of lymphatics and explained gentleness of touch, importance of stretch,LN activation etc In supine: Short neck, 5 diaphragmatic breaths, R axillary nodes and establishment of interaxillary pathway, L inguinal nodes and establishment of axilloinguinal pathway, then L breast moving fluid towards pathways spending extra time in any areas of fibrosis then retracing all steps while explaining to pt. Photo taken and placed in Media and Charlie Pitter messaged to look at photo. Feel redness more than likely from the swelling since it has been about the same for 4 weeks per pt. And with minimal warmth   PATIENT EDUCATION:  Education details: POC, treatment interventions, education in MLD Person educated: Patient Education method: Programmer, multimedia, demonstration Education comprehension: verbalized understanding and needs further education  HOME EXERCISE PROGRAM: Breast MLD  ASSESSMENT:  CLINICAL IMPRESSION: Pt had some noted softening of the breast as evidenced by loose skin present inferior and medial to areolar and pt reports this area also feels improved. Today spent more time cohesively treating intact anterior tissue to allow for improved drainage across anterior anastomosis. Also  spent more time reviewing basics of anatomy of lymphatic system to help further explain need for daily MLD. Pt was able to verbalize good understanding.   OBJECTIVE IMPAIRMENTS: decreased knowledge of condition, decreased ROM, increased edema, impaired UE functional use, postural dysfunction, and pain.   ACTIVITY LIMITATIONS: sleeping, bed mobility, and reach over head  PARTICIPATION LIMITATIONS:  able to participate in most but with breast swelling/discomfort  PERSONAL FACTORS: 1-2 comorbidities: Left breast cancer s/p radiation  are also affecting patient's functional outcome.   REHAB POTENTIAL: Good  CLINICAL DECISION MAKING: Stable/uncomplicated  EVALUATION COMPLEXITY: Low  GOALS: Goals reviewed with patient? Yes  SHORT TERM GOALS=LONG TERM GOALS: Target date: 07/09/2023  Pt will be independent with use of compression bra to decrease left breast swelling Baseline: Goal status: INITIAL  2.  Pt will be independent in left breast MLD to decrease swelling Baseline:  Goal status: INITIAL  3.  Pt will report breast pain atleast 50% better Baseline:  Goal status: INITIAL  4.  Pt will have breast complaints questionnaire improved to no greater than 20 to demonstrate improved swelling Baseline:  Goal status: INITIAL  5.  Pt will be able to reach with the left UE without left breast pain Baseline:  Goal status: INITIAL  6.  Pt will be able to sleep on the left side for short periods of time Baseline:  Goal status: INITIAL   PLAN:  PT FREQUENCY: 2x/week  PT DURATION: 6 weeks  PLANNED INTERVENTIONS: 97164- PT Re-evaluation, 97110-Therapeutic exercises, 97530- Therapeutic activity, 97112- Neuromuscular re-education, 97535- Self Care, 84132- Manual therapy, and Patient/Family education  PLAN FOR NEXT SESSION: continue MLD and education of patient (she may want to D/C from POC early) check left shoulder ROM (be aware of RA)  [facility] [address] [place], [place], [address] Phone: [phone]   Fax:  [phone]  Patient Details  Name: Linda Wood MRN: [id number] Date of Birth: 1956/03/20 Referring Provider:  Lillard Anes [place]*  Encounter Date: 06/26/2023  Hermenia Bers, PTA 06/26/2023, 12:41 PM  [facility] [address] [place], [place], [address] Phone: [phone]   Fax:  [phone]

## 2023-07-01 ENCOUNTER — Ambulatory Visit: Payer: Medicare HMO | Attending: Adult Health

## 2023-07-01 DIAGNOSIS — R293 Abnormal posture: Secondary | ICD-10-CM | POA: Diagnosis not present

## 2023-07-01 DIAGNOSIS — Z483 Aftercare following surgery for neoplasm: Secondary | ICD-10-CM | POA: Insufficient documentation

## 2023-07-01 DIAGNOSIS — C50412 Malignant neoplasm of upper-outer quadrant of left female breast: Secondary | ICD-10-CM | POA: Diagnosis not present

## 2023-07-01 DIAGNOSIS — I890 Lymphedema, not elsewhere classified: Secondary | ICD-10-CM | POA: Diagnosis not present

## 2023-07-01 DIAGNOSIS — Z170 Estrogen receptor positive status [ER+]: Secondary | ICD-10-CM | POA: Insufficient documentation

## 2023-07-01 NOTE — Therapy (Signed)
 OUTPATIENT PHYSICAL THERAPY  UPPER EXTREMITY ONCOLOGY  Patient Name: Linda Wood MRN: [id number] DOB:29-Jun-1955, 68 y.o., female Today's Date: 07/01/2023  END OF SESSION:  PT End of Session - 07/01/23 1403     Visit Number 6    Number of Visits 12    Date for PT Re-Evaluation 07/09/23    Authorization Type [employer]    PT Start Time 1403    PT Stop Time 1450    PT Time Calculation (min) 47 min    Activity Tolerance Patient tolerated treatment well    Behavior During Therapy WFL for tasks assessed/performed               Past Medical History:  Diagnosis Date   Anxiety    Breast cancer (HCC)    Depression    Hypertension    Rheumatoid arthritis (HCC)    Past Surgical History:  Procedure Laterality Date   BREAST BIOPSY Left 06/20/2022   [place] LT BREAST BX W LOC DEV 1ST LESION IMG BX SPEC US GUIDE 06/20/2022 [facility]   BREAST BIOPSY Left 06/20/2022   MM LT BREAST BX W LOC DEV 1ST LESION IMAGE BX SPEC STEREO GUIDE 06/20/2022 [facility]   BREAST BIOPSY  07/15/2022   MM LT RADIOACTIVE SEED LOC MAMMO GUIDE 07/15/2022 [facility]   BREAST BIOPSY  07/15/2022   MM LT RADIOACTIVE SEED EA ADD LESION LOC MAMMO GUIDE 07/15/2022 [facility]   BREAST LUMPECTOMY WITH RADIOACTIVE SEED AND SENTINEL LYMPH NODE BIOPSY Left 07/16/2022   Procedure: LEFT BREAST LUMPECTOMY WITH RADIOACTIVE SEED X2 AND AXILLARY SENTINEL LYMPH NODE BIOPSY;  Surgeon: Manus Rudd, MD;  Location: [facility] OR;  Service: General;  Laterality: Left;   CATARACT EXTRACTION, BILATERAL     08/14/2022, 08/28/2022   GASTRIC BYPASS     HAND SURGERY Right    LASIK     RE-EXCISION OF BREAST LUMPECTOMY Left 07/24/2022   Procedure: RE-EXCISION OF LEFT BREAST LUMPECTOMY ANTERIOR AND LATERAL MARGINS;  Surgeon: Manus Rudd, MD;  Location: [facility] OR;  Service: General;  Laterality: Left;  45 MIN ROOM 12   Patient Active Problem List   Diagnosis Date Noted   Genetic testing 07/05/2022   Family history of  breast cancer 06/27/2022   Malignant neoplasm of upper-outer quadrant of left breast in female, estrogen receptor positive (HCC) 06/25/2022   Psychophysiological insomnia 11/28/2021   Rheumatoid arthritis (HCC) 10/29/2021   Essential hypertension 10/29/2021   Depression, major, single episode, complete remission (HCC) 10/29/2021   Rib fractures 10/15/2021    PCP:   REFERRING PROVIDER: Lillard Anes, NP  REFERRING DIAG:L  Breast lymphedema s/p surgery and radiation  THERAPY DIAG:  Aftercare following surgery for neoplasm  Malignant neoplasm of upper-outer quadrant of left breast in female, estrogen receptor positive (HCC)  Abnormal posture  Lymphedema of breast  ONSET DATE: December 2024  Rationale for Evaluation and Treatment: Rehabilitation  SUBJECTIVE:  SUBJECTIVE STATEMENT:  The swelling in my breast is still there but it doesn't bother me. I think I would like to make today my last day. I do the MLD but not as consistently as I should. I wore the foam in my bra 1 time and I didn't really pay attention to if anything improved. Everything is doing better.  EVAL: Pt with concerns of left breast pain and swelling which started about a month ago. When she was seen on 05/20/2023 by Lillard Anes the breast was tender, warm and erythematous. She was given Augmentin for 10 days and was to schedule mammogram and [place] for further evaluation. She has  2 more doses of Augmentin left.  Very little change in Redness with Augmentin and no change in pain. Swelling has reduced a little. It bothers her to sleep on her left side, and to reach high  PERTINENT HISTORY:   Patient was diagnosed on 06/20/2022 with left grade 2 invasive ductal carcinoma breast cancer. It measures 7 mm and is located in the upper outer  quadrant. It is ER/PR positive and HER2 negative with a Ki67 of 10% . She had a double  left lumpectomy with SLNB on 07/16/2022, followed by a re-excision on 07/24/2022 to get clear margins. She had radiation from 09/09/2022-10/07/2022 and is currently taking Anastrozole.   PAIN:  Are you having pain? No  PRECAUTIONS: Left UE lymphedema risk, RA  RED FLAGS: None   WEIGHT BEARING RESTRICTIONS: No  FALLS:  Has patient fallen in last 6 months? No  LIVING ENVIRONMENT: Lives with: lives with their spouse Lives in: House/apartment   OCCUPATION: does not work  LEISURE: needlepoint, Archivist  HAND DOMINANCE: right   PRIOR LEVEL OF FUNCTION: Independent  PATIENT GOALS: make swelling better, pain better   OBJECTIVE: Note: Objective measures were completed at Evaluation unless otherwise noted.  COGNITION: Overall cognitive status: Within functional limits for tasks assessed   PALPATION: Fibrosis noted inferior medial breast, no significant tenderness  OBSERVATIONS / OTHER ASSESSMENTS: pt is wearing a compression bra [facility]) with impressions from bra medial and lateral breast, redness noted with picture in media and present for 4 weeks. Unchanged from antibiotics.peau'd'orange inferior medial left breast with fibrosis, enlarged pores medially greater than laterally. Generalized breast swelling. Minimal warmth  SENSATION: Light touch: Deficits    POSTURE: forward head, rounded shoulders  UPPER EXTREMITY AROM/PROM:  A/PROM RIGHT   eval   Shoulder extension   Shoulder flexion 137  Shoulder abduction 130  Shoulder internal rotation   Shoulder external rotation     (Blank rows = not tested)  A/PROM LEFT   eval  Shoulder extension   Shoulder flexion 138, + in breast  Shoulder abduction 103  Shoulder internal rotation   Shoulder external rotation     (Blank rows = not tested)  CERVICAL AROM: All within functional limits:     UPPER EXTREMITY STRENGTH:   LYMPHEDEMA  ASSESSMENTS:  SURGERY TYPE/DATE: double left lumpectomy with SLNB on 07/16/2022, followed by a re-excision on 07/24/2022 to get clear margins  NUMBER OF LYMPH NODES REMOVED: 0/2 CHEMOTHERAPY: NO RADIATION:YES HORMONE TREATMENT: YES, Asastrozole INFECTIONS: has taken Augmentin and has 1 and a half days left; no real change in redness. Redness present x 4 weeks per pt.  LYMPHEDEMA ASSESSMENTS:   LANDMARK RIGHT  eval  At axilla    15 cm proximal to olecranon process   10 cm proximal to olecranon process   Olecranon process   15 cm proximal to  ulnar styloid process   10 cm proximal to ulnar styloid process   Just proximal to ulnar styloid process   Across hand at thumb web space   At base of 2nd digit   (Blank rows = not tested)  LANDMARK LEFT  eval  At axilla    15 cm proximal to olecranon process   10 cm proximal to olecranon process   Olecranon process   15 cm proximal to ulnar styloid process   10 cm proximal to ulnar styloid process   Just proximal to ulnar styloid process   Across hand at thumb web space   At base of 2nd digit   (Blank rows = not tested)   FUNCTIONAL TESTS:    GAIT: WNL  L-DEX LYMPHEDEMA SCREENING: The patient was assessed using the L-Dex machine today to produce a lymphedema index baseline score. The patient will be reassessed on a regular basis (typically every 3 months) to obtain new L-Dex scores. If the score is > 6.5 points away from his/her baseline score indicating onset of subclinical lymphedema, it will be recommended to wear a compression garment for 4 weeks, 12 hours per day and then be reassessed. If the score continues to be > 6.5 points from baseline at reassessment, we will initiate lymphedema treatment. Assessing in this manner has a 95% rate of preventing clinically significant lymphedema. SCREEN 05/26/2023 -2.1    BREAST COMPLAINTS QUESTIONNAIRE Pain:5 Heaviness:9 Swollen feeling:9 Tense Skin:9 Redness:7 Bra Print:7 Size of  Pores:9 Hard feeling:8  Total:   63  /80 A Score over 9 indicates lymphedema issues in the breast BREAST COMPLAINTS QUESTIONNAIRE Pain:2 Heaviness:4 Swollen feeling:4 Tense Skin:3 Redness:2 Bra Print:7 Size of Pores:7 Hard feeling: 6 Total:   38  /80 A Score over 9 indicates lymphedema issues in the breast                                                                                                                          TREATMENT DATE:  Pt permission and consent throughout each step of examination and treatment with modification and draping if requested when working on sensitive areas  07/01/2023 In supine: Therapist read instructions to pt while pt performed MLD;Short neck, 5 breaths, Rt axillary and left axillary LN's  establishment of anterior inter-axillary anastomosis, Lt inguinal nodes and establishment of Lt axillo-inguinal anastomosis, then Lt medial and superior breast redirecting towards anterior anastomosis, then into Rt S/L for focus to lateral and inferior breast redirecting towards lateral anastomosis and then finished in supine retracing all steps redirecting towards anastomoses and ending with LN's Discussed importance of continuing MLD and compression bra due to enlarged pores and swelling still present, but pt would like to continue at home. Gave my card and another set of instructions.     06/26/23: Very mild redness noted that pt reports is normal.  In supine: Short neck, superficial and deep abdominals, Rt axillary and pectoral nodes, Rt intact anterior sequence and establishment of anterior  inter-axillary anastomosis, Lt inguinal nodes and establishment of Lt axillo-inguinal anastomosis, then Lt medial and superior breast redirecting towards anterior anastomosis, then into Rt S/L for focus to lateral and inferior breast redirecting towards lateral anastomosis and then finished in supine retracing all steps redirecting towards anastomoses. Reviewed basics of anatomy  with pt and reasoning of being consistent with self MLD being done daily is that by decreasing lymphedema can decrease her overall risk of developing cellulitis.   06/24/2023 No redness noted left breast In supine: Short neck, 5 diaphragmatic breaths, bil axillary nodes and establishment of interaxillary pathway, L inguinal nodes and establishment of axilloinguinal pathway, then L breast moving fluid towards pathways spending extra time in any areas of fibrosis then retracing all steps while explaining to pt with vcs, tcs, and education on lymphatic physiology throughout. Therapist performed all steps and after demonstration pt performed all steps. She required VC's and TC's initially for stretch and to decrease pressure but improved greatly by the end. She did have difficulty reaching the lateral breast. Spaghetti foam pad made and pt assisted with placing in compression bra to try and decrease swelling. We discussed trying to wear her bra 24/7 if possible to try and reduce edema.     06/05/2023 MLD as outlined below. Instructed in self MLD technique with both hands on sternum moving toward right axilla and at left lateral chest toward left inguinal. Pt able to repeat after instruction with verbal cues and hand over hand insturction  Reinforced remedial ROM of dowel flexion with deep breathing .  06/03/23 In supine: Short neck, 5 diaphragmatic breaths, bil axillary nodes and establishment of interaxillary pathway, L inguinal nodes and establishment of axilloinguinal pathway, then L breast moving fluid towards pathways spending extra time in any areas of fibrosis then retracing all steps while explaining to pt with vcs, tcs, and education on lymphatic physiology throughout.   05/28/2023 Showed pt diagram of lymphatics and explained gentleness of touch, importance of stretch,LN activation etc In supine: Short neck, 5 diaphragmatic breaths, R axillary nodes and establishment of interaxillary pathway, L  inguinal nodes and establishment of axilloinguinal pathway, then L breast moving fluid towards pathways spending extra time in any areas of fibrosis then retracing all steps while explaining to pt. Photo taken and placed in Media and Charlie Pitter messaged to look at photo. Feel redness more than likely from the swelling since it has been about the same for 4 weeks per pt. And with minimal warmth   PATIENT EDUCATION:  Education details: POC, treatment interventions, education in MLD Person educated: Patient Education method: Explanation, demonstration Education comprehension: verbalized understanding and needs further education  HOME EXERCISE PROGRAM: Breast MLD  ASSESSMENT:  CLINICAL IMPRESSION: Pt is no longer having pain and prefers to be discharged at this time. She has met 5/6 goals established. She has improved with Breast Complaints survey but has not achieved that goal. She has achieved all other goals. We reviewed all techniques today and she has improved greatly with her performance of MLD. We discussed the importance of self management to decrease swelling and to reduce the risk of infection. She verbalized understanding    OBJECTIVE IMPAIRMENTS: decreased knowledge of condition, decreased ROM, increased edema, impaired UE functional use, postural dysfunction, and pain.   ACTIVITY LIMITATIONS: sleeping, bed mobility, and reach over head  PARTICIPATION LIMITATIONS:  able to participate in most but with breast swelling/discomfort  PERSONAL FACTORS: 1-2 comorbidities: Left breast cancer s/p radiation  are also affecting patient's functional  outcome.   REHAB POTENTIAL: Good  CLINICAL DECISION MAKING: Stable/uncomplicated  EVALUATION COMPLEXITY: Low  GOALS: Goals reviewed with patient? Yes  SHORT TERM GOALS=LONG TERM GOALS: Target date: 07/09/2023  Pt will be independent with use of compression bra to decrease left breast swelling Baseline: Goal status: MET, but not  always consistent 2.  Pt will be independent in left breast MLD to decrease swelling Baseline:  Goal status: MET 07/01/2023 3.  Pt will report breast pain atleast 50% better Baseline:  Goal status: MET 07/01/2023  4.  Pt will have breast complaints questionnaire improved to no greater than 20 to demonstrate improved swelling Baseline:  Goal status: NOT MET 5.  Pt will be able to reach with the left UE without left breast pain Baseline:  Goal status: MET 07/01/2023 6.  Pt will be able to sleep on the left side for short periods of time Baseline:  Goal status: MET 07/01/2023  PLAN:  PT FREQUENCY: 2x/week  PT DURATION: 6 weeks  PLANNED INTERVENTIONS: 97164- PT Re-evaluation, 97110-Therapeutic exercises, 97530- Therapeutic activity, 97112- Neuromuscular re-education, 97535- Self Care, 16109- Manual therapy, and Patient/Family education  PLAN FOR NEXT SESSION: Pt is discharged to independent self management. Advised to contact us with questions or concerns Alvira Monday, PT [facility] [address] [place], [place], [address] Phone: [phone]   Fax:  [phone]  Patient Details  Name: Linda Wood MRN: [id number] Date of Birth: 06-29-1955 Referring Provider:  Lillard Anes [place]*  Encounter Date: 07/01/2023  Waynette Buttery, PT 07/01/2023, 2:55 PM   [facility] [address] [place], [place], [address] Phone: [phone]   Fax:  [phone]

## 2023-07-03 ENCOUNTER — Encounter: Payer: Medicare HMO | Admitting: Rehabilitation

## 2023-07-10 ENCOUNTER — Other Ambulatory Visit (HOSPITAL_BASED_OUTPATIENT_CLINIC_OR_DEPARTMENT_OTHER): Payer: Self-pay

## 2023-07-10 ENCOUNTER — Inpatient Hospital Stay: Payer: Medicare HMO | Attending: Adult Health | Admitting: Hematology and Oncology

## 2023-07-10 VITALS — BP 161/83 | HR 93 | Temp 98.3°F | Resp 18 | Wt 161.6 lb

## 2023-07-10 DIAGNOSIS — I890 Lymphedema, not elsewhere classified: Secondary | ICD-10-CM | POA: Diagnosis not present

## 2023-07-10 DIAGNOSIS — Z803 Family history of malignant neoplasm of breast: Secondary | ICD-10-CM | POA: Insufficient documentation

## 2023-07-10 DIAGNOSIS — Z923 Personal history of irradiation: Secondary | ICD-10-CM | POA: Diagnosis not present

## 2023-07-10 DIAGNOSIS — Z1732 Human epidermal growth factor receptor 2 negative status: Secondary | ICD-10-CM | POA: Insufficient documentation

## 2023-07-10 DIAGNOSIS — Z1721 Progesterone receptor positive status: Secondary | ICD-10-CM | POA: Diagnosis not present

## 2023-07-10 DIAGNOSIS — Z79811 Long term (current) use of aromatase inhibitors: Secondary | ICD-10-CM | POA: Insufficient documentation

## 2023-07-10 DIAGNOSIS — C50412 Malignant neoplasm of upper-outer quadrant of left female breast: Secondary | ICD-10-CM | POA: Diagnosis not present

## 2023-07-10 DIAGNOSIS — M8580 Other specified disorders of bone density and structure, unspecified site: Secondary | ICD-10-CM | POA: Diagnosis not present

## 2023-07-10 DIAGNOSIS — Z170 Estrogen receptor positive status [ER+]: Secondary | ICD-10-CM | POA: Insufficient documentation

## 2023-07-10 MED ORDER — ANASTROZOLE 1 MG PO TABS
1.0000 mg | ORAL_TABLET | Freq: Every day | ORAL | 3 refills | Status: DC
  Filled 2023-07-10 – 2023-07-14 (×2): qty 90, 90d supply, fill #0
  Filled 2023-10-28 – 2023-10-29 (×2): qty 90, 90d supply, fill #1

## 2023-07-10 NOTE — Assessment & Plan Note (Signed)
 Linda Wood is a 68 year old woman with history of stage Ia ER/PR positive breast cancer diagnosed in February 2024.  She is status postlumpectomy followed by adjuvant radiation therapy and began on anastrozole in July 2024.  Breast cancer On anastrozole with no new issues. Aware of potential side effects. Monitoring bone density due to anastrozole. - Continue anastrozole therapy. - Ensure six-month supply of anastrozole. Send prescription to pharmacy. - Schedule diagnostic mammogram in January 2026  Lymphedema of the left breast Improved with physical therapy. Persistent enlargement and creasing without pain. Continued management advised. - Continue wearing compression bra. - Continue physical therapy exercises at home. - Perform breast massages.  Osteopenia Confirmed by bone density test. Weight-bearing exercises and dietary modifications recommended. Monitoring due to anastrozole. - Engage in weight-bearing exercises like walking. - Continue vitamin D supplements. - Consume calcium-rich foods unless lactose intolerant.  Family history of breast cancer Significant family history. Concern for daughter's risk. Early risk assessment and screening advised. - Advise daughter to consult doctor for risk assessment. Consider early MRIs if life time risk over 20%.  Follow-up Comfortable with current management. Aware to contact clinic if condition worsens. - Schedule follow-up in six months. - Advise to contact clinic if condition worsens or new symptoms occur.

## 2023-07-10 NOTE — Progress Notes (Signed)
  [facility] Cancer Follow up:    Linda Dory, DO [address] [place] [address]   DIAGNOSIS:  Cancer Staging  Malignant neoplasm of upper-outer quadrant of left breast in female, estrogen receptor positive (HCC) Staging form: Breast, AJCC 8th Edition - Clinical stage from 06/26/2022: Stage IA (cT1b, cN0, cM0, G2, ER+, PR+, HER2-) - Signed by Ronny Bacon, PA-C on 06/26/2022 Stage prefix: Initial diagnosis Method of lymph node assessment: Clinical Histologic grading system: 3 grade system   SUMMARY OF ONCOLOGIC HISTORY: Oncology History  Malignant neoplasm of upper-outer quadrant of left breast in female, estrogen receptor positive (HCC)  06/11/2022 Mammogram   Bilateral diagnostic mammogram showed suspicious left breast mass at 1 o'clock position.  Ultrasound-guided biopsy was recommended.  There were also indeterminate left breast calcs recommend stereotactic biopsy.  No suspicious left axillary adenopathy.  Right breast normal   06/20/2022 Pathology Results   Pathology results showed grade 2 invasive ductal carcinoma prognostics from the first specimen at 1:00 10 cm from the nipple showed ER 100% positive strong staining PR 20% positive moderate to strong staining and Ki-67 of 10%, tumor cells negative for HER2 1+.  Second biopsy which was from upper outer quadrant coil shaped clip also showed invasive ductal carcinoma grade 2 prognostics for this 1 showed ER 95% positive strong staining PR 0% negative Ki-67 of 15% and HER2 2+ by IHC and FISH negative   06/26/2022 Cancer Staging   Staging form: Breast, AJCC 8th Edition - Clinical stage from 06/26/2022: Stage IA (cT1b, cN0, cM0, G2, ER+, PR+, HER2-) - Signed by Ronny Bacon, PA-C on 06/26/2022 Stage prefix: Initial diagnosis Method of lymph node assessment: Clinical Histologic grading system: 3 grade system    Genetic Testing   [facility] Custom Panel+RNA was Negative. Of  note, a variant of uncertain significance was detected in the NF1 gene (c.5668A>G). Report date was 07/04/2022.  The Custom Hereditary Cancers Panel offered by [facility] includes sequencing and/or deletion duplication testing of the following 43 genes: APC, ATM, AXIN2, BAP1, BARD1, BMPR1A, BRCA1, BRCA2, BRIP1, CDH1, CDK4, CDKN2A (p14ARF and p16INK4a only), CHEK2, CTNNA1, EPCAM (Deletion/duplication testing only), FH, GREM1 (promoter region duplication testing only), HOXB13, KIT, MBD4, MEN1, MLH1, MSH2, MSH3, MSH6, MUTYH, NF1, NHTL1, PALB2, PDGFRA, PMS2, POLD1, POLE, PTEN, RAD51C, RAD51D, SMAD4, SMARCA4. STK11, TP53, TSC1, TSC2, and VHL.  UPDATE: NF1 c.5668A>G (p.[id number]) VUS has been amended to Likely Benign. Amended report date is 08/19/2022.   09/09/2022 - 10/07/2022 Radiation Therapy   Plan Name: Breast_L_BH Site: Breast, Left Technique: 3D Mode: Photon Dose Per Fraction: 2.66 Gy Prescribed Dose (Delivered / Prescribed): 42.56 Gy / 42.56 Gy Prescribed Fxs (Delivered / Prescribed): 16 / 16   Plan Name: Brst_L_Bst_BH Site: Breast, Left Technique: 3D Mode: Photon Dose Per Fraction: 2 Gy Prescribed Dose (Delivered / Prescribed): 8 Gy / 8 Gy Prescribed Fxs (Delivered / Prescribed): 4 / 4   10/2022 -  Anti-estrogen oral therapy   Anastrozole     CURRENT THERAPY: Anastrozole  INTERVAL HISTORY:   Discussed the use of AI scribe software for clinical note transcription with the patient, who gave verbal consent to proceed.  The patient presents with follow-up for left breast lymphedema and osteopenia.  She has experienced improvement in her left breast lymphedema following physical therapy treatments at a rehabilitation facility. The breast remains slightly enlarged but is not uncomfortable, allowing her to sleep on her stomach without pain. A mammogram in January showed changes primarily  due to radiation, and she was on antibiotics at that time. A punch biopsy was considered if there was no  improvement, but she notes significant improvement now.  She is currently taking anastrozole without new issues or side effects. Initially, she experienced leg pain when starting the medication, but this has resolved. She is also taking vitamin D. Osteopenia was identified in a bone density test conducted in July 2024.  There is a family history of breast cancer, with her mother and a first cousin having had the disease. She is concerned for her daughter, who is 70 years old, regarding the risk of breast cancer.   Patient Active Problem List   Diagnosis Date Noted   Genetic testing 07/05/2022   Family history of breast cancer 06/27/2022   Malignant neoplasm of upper-outer quadrant of left breast in female, estrogen receptor positive (HCC) 06/25/2022   Psychophysiological insomnia 11/28/2021   Rheumatoid arthritis (HCC) 10/29/2021   Essential hypertension 10/29/2021   Depression, major, single episode, complete remission (HCC) 10/29/2021   Rib fractures 10/15/2021    is allergic to dextromethorphan, dextromethorphan-guaifenesin, and promethazine.  MEDICAL HISTORY: Past Medical History:  Diagnosis Date   Anxiety    Breast cancer (HCC)    Depression    Hypertension    Rheumatoid arthritis (HCC)     SURGICAL HISTORY: Past Surgical History:  Procedure Laterality Date   BREAST BIOPSY Left 06/20/2022   [place] LT BREAST BX W LOC DEV 1ST LESION IMG BX SPEC US GUIDE 06/20/2022 [facility]   BREAST BIOPSY Left 06/20/2022   MM LT BREAST BX W LOC DEV 1ST LESION IMAGE BX SPEC STEREO GUIDE 06/20/2022 [facility]   BREAST BIOPSY  07/15/2022   MM LT RADIOACTIVE SEED LOC MAMMO GUIDE 07/15/2022 [facility]   BREAST BIOPSY  07/15/2022   MM LT RADIOACTIVE SEED EA ADD LESION LOC MAMMO GUIDE 07/15/2022 [facility]   BREAST LUMPECTOMY WITH RADIOACTIVE SEED AND SENTINEL LYMPH NODE BIOPSY Left 07/16/2022   Procedure: LEFT BREAST LUMPECTOMY WITH RADIOACTIVE SEED X2 AND AXILLARY  SENTINEL LYMPH NODE BIOPSY;  Surgeon: Manus Rudd, MD;  Location: [facility] OR;  Service: General;  Laterality: Left;   CATARACT EXTRACTION, BILATERAL     08/14/2022, 08/28/2022   GASTRIC BYPASS     HAND SURGERY Right    LASIK     RE-EXCISION OF BREAST LUMPECTOMY Left 07/24/2022   Procedure: RE-EXCISION OF LEFT BREAST LUMPECTOMY ANTERIOR AND LATERAL MARGINS;  Surgeon: Manus Rudd, MD;  Location: [facility] OR;  Service: General;  Laterality: Left;  45 MIN ROOM 12    SOCIAL HISTORY: Social History   Socioeconomic History   Marital status: Married    Spouse name: [name]   Number of children: Not on file   Years of education: Not on file   Highest education level: Not on file  Occupational History   Not on file  Tobacco Use   Smoking status: Never    Passive exposure: Never   Smokeless tobacco: Never  Vaping Use   Vaping status: Never Used  Substance and Sexual Activity   Alcohol use: Yes    Alcohol/week: 3.0 standard drinks of alcohol    Types: 3 Glasses of wine per week    Comment: wine 3 times per week   Drug use: Never   Sexual activity: Yes    Partners: Male  Other Topics Concern   Not on file  Social History Narrative   Not on file   Social Drivers of Health   Financial  Resource Strain: Low Risk  (06/26/2022)   Overall Financial Resource Strain (CARDIA)    Difficulty of Paying Living Expenses: Not very hard  Food Insecurity: No Food Insecurity (06/26/2022)   Hunger Vital Sign    Worried About Running Out of Food in the Last Year: Never true    Ran Out of Food in the Last Year: Never true  Transportation Needs: No Transportation Needs (06/26/2022)   PRAPARE - Administrator, Civil Service (Medical): No    Lack of Transportation (Non-Medical): No  Physical Activity: Not on file  Stress: Not on file  Social Connections: Not on file  Intimate Partner Violence: Not on file    FAMILY HISTORY: Family History  Problem Relation Age of Onset   Heart failure Mother     Breast cancer Mother 72    Review of Systems  Constitutional:  Negative for appetite change, chills, fatigue, fever and unexpected weight change.  HENT:   Negative for hearing loss, lump/mass and trouble swallowing.   Eyes:  Negative for eye problems and icterus.  Respiratory:  Negative for chest tightness, cough and shortness of breath.   Cardiovascular:  Negative for chest pain, leg swelling and palpitations.  Gastrointestinal:  Negative for abdominal distention, abdominal pain, constipation, diarrhea, nausea and vomiting.  Endocrine: Negative for hot flashes.  Genitourinary:  Negative for difficulty urinating.   Musculoskeletal:  Negative for arthralgias.  Skin:  Negative for itching and rash.  Neurological:  Negative for dizziness, extremity weakness, headaches and numbness.  Hematological:  Negative for adenopathy. Does not bruise/bleed easily.  Psychiatric/Behavioral:  Negative for depression. The patient is not nervous/anxious.       PHYSICAL EXAMINATION    Vitals:   07/10/23 1129  BP: (!) 161/83  Pulse: 93  Resp: 18  Temp: 98.3 F (36.8 C)  SpO2: 96%    Physical Exam Constitutional:      General: She is not in acute distress.    Appearance: Normal appearance. She is not toxic-appearing.  HENT:     Head: Normocephalic and atraumatic.     Mouth/Throat:     Mouth: Mucous membranes are moist.     Pharynx: Oropharynx is clear. No oropharyngeal exudate or posterior oropharyngeal erythema.  Eyes:     General: No scleral icterus. Cardiovascular:     Rate and Rhythm: Normal rate and regular rhythm.     Pulses: Normal pulses.     Heart sounds: Normal heart sounds.  Pulmonary:     Effort: Pulmonary effort is normal.     Breath sounds: Normal breath sounds.  Chest:     Comments: Right breast is benign. Left breast with ongoing lymphedema. Improved compared to last visit. No definite masses or regional adenopathy Abdominal:     General: Abdomen is flat. Bowel  sounds are normal. There is no distension.     Palpations: Abdomen is soft.     Tenderness: There is no abdominal tenderness.  Musculoskeletal:        General: No swelling.     Cervical back: Neck supple.  Lymphadenopathy:     Cervical: No cervical adenopathy.  Skin:    General: Skin is warm and dry.     Findings: No rash.  Neurological:     General: No focal deficit present.     Mental Status: She is alert.  Psychiatric:        Mood and Affect: Mood normal.        Behavior: Behavior normal.  LABORATORY DATA:  CBC    Component Value Date/Time   WBC 6.6 06/26/2022 0802   WBC 8.1 05/10/2022 1101   RBC 4.57 06/26/2022 0802   HGB 12.8 06/26/2022 0802   HCT 39.6 06/26/2022 0802   PLT 320 06/26/2022 0802   MCV 86.7 06/26/2022 0802   MCH 28.0 06/26/2022 0802   MCHC 32.3 06/26/2022 0802   RDW 15.4 06/26/2022 0802   LYMPHSABS 2.0 06/26/2022 0802   MONOABS 0.7 06/26/2022 0802   EOSABS 0.4 06/26/2022 0802   BASOSABS 0.1 06/26/2022 0802    CMP     Component Value Date/Time   NA 141 06/26/2022 0802   K 4.1 06/26/2022 0802   CL 107 06/26/2022 0802   CO2 29 06/26/2022 0802   GLUCOSE 66 (L) 06/26/2022 0802   BUN 15 06/26/2022 0802   CREATININE 0.59 06/26/2022 0802   CREATININE 0.65 05/10/2022 1101   CALCIUM 9.8 06/26/2022 0802   PROT 6.1 (L) 06/26/2022 0802   ALBUMIN 3.8 06/26/2022 0802   AST 13 (L) 06/26/2022 0802   ALT 10 06/26/2022 0802   ALKPHOS 73 06/26/2022 0802   BILITOT 0.3 06/26/2022 0802   GFRNONAA >60 06/26/2022 0802         ASSESSMENT and THERAPY PLAN:   Malignant neoplasm of upper-outer quadrant of left breast in female, estrogen receptor positive (HCC) Linda Wood is a 68 year old woman with history of stage Ia ER/PR positive breast cancer diagnosed in February 2024.  She is status postlumpectomy followed by adjuvant radiation therapy and began on anastrozole in July 2024.  Breast cancer On anastrozole with no new issues. Aware of potential side  effects. Monitoring bone density due to anastrozole. - Continue anastrozole therapy. - Ensure six-month supply of anastrozole. Send prescription to pharmacy. - Schedule diagnostic mammogram in January 2026  Lymphedema of the left breast Improved with physical therapy. Persistent enlargement and creasing without pain. Continued management advised. - Continue wearing compression bra. - Continue physical therapy exercises at home. - Perform breast massages.  Osteopenia Confirmed by bone density test. Weight-bearing exercises and dietary modifications recommended. Monitoring due to anastrozole. - Engage in weight-bearing exercises like walking. - Continue vitamin D supplements. - Consume calcium-rich foods unless lactose intolerant.  Family history of breast cancer Significant family history. Concern for daughter's risk. Early risk assessment and screening advised. - Advise daughter to consult doctor for risk assessment. Consider early MRIs if life time risk over 20%.  Follow-up Comfortable with current management. Aware to contact clinic if condition worsens. - Schedule follow-up in six months. - Advise to contact clinic if condition worsens or new symptoms occur.    All questions were answered. The patient knows to call the clinic with any problems, questions or concerns. We can certainly see the patient much sooner if necessary.  Total encounter time:30 minutes*in face-to-face visit time, chart review, lab review, care coordination, order entry, and documentation of the encounter time.   *Total Encounter Time as defined by the Centers for Medicare and Medicaid Services includes, in addition to the face-to-face time of a patient visit (documented in the note above) non-face-to-face time: obtaining and reviewing outside history, ordering and reviewing medications, tests or procedures, care coordination (communications with other health care professionals or caregivers) and documentation  in the medical record.

## 2023-07-14 ENCOUNTER — Other Ambulatory Visit (HOSPITAL_BASED_OUTPATIENT_CLINIC_OR_DEPARTMENT_OTHER): Payer: Self-pay

## 2023-07-22 ENCOUNTER — Other Ambulatory Visit (INDEPENDENT_AMBULATORY_CARE_PROVIDER_SITE_OTHER)

## 2023-07-22 DIAGNOSIS — I10 Essential (primary) hypertension: Secondary | ICD-10-CM | POA: Diagnosis not present

## 2023-07-22 DIAGNOSIS — Z1159 Encounter for screening for other viral diseases: Secondary | ICD-10-CM

## 2023-07-22 LAB — LIPID PANEL
Cholesterol: 229 mg/dL — ABNORMAL HIGH (ref 0–200)
HDL: 100.5 mg/dL (ref >39.00)
LDL Cholesterol: 109 mg/dL — ABNORMAL HIGH (ref 0–99)
NonHDL: 128.11
Total CHOL/HDL Ratio: 2
Triglycerides: 95 mg/dL (ref 0.0–149.0)
VLDL: 19 mg/dL (ref 0.0–40.0)

## 2023-07-22 LAB — COMPREHENSIVE METABOLIC PANEL
ALT: 6 U/L (ref 0–35)
AST: 13 U/L (ref 0–37)
Albumin: 4 g/dL (ref 3.5–5.2)
Alkaline Phosphatase: 87 U/L (ref 39–117)
BUN: 13 mg/dL (ref 6–23)
CO2: 27 meq/L (ref 19–32)
Calcium: 10.3 mg/dL (ref 8.4–10.5)
Chloride: 105 meq/L (ref 96–112)
Creatinine, Ser: 0.6 mg/dL (ref 0.40–1.20)
GFR: 91.17 mL/min (ref >60.00)
Glucose, Bld: 91 mg/dL (ref 70–99)
Potassium: 4.4 meq/L (ref 3.5–5.1)
Sodium: 140 meq/L (ref 135–145)
Total Bilirubin: 0.5 mg/dL (ref 0.2–1.2)
Total Protein: 6.5 g/dL (ref 6.0–8.3)

## 2023-07-22 LAB — CBC
HCT: 40.2 % (ref 36.0–46.0)
Hemoglobin: 13 g/dL (ref 12.0–15.0)
MCHC: 32.4 g/dL (ref 30.0–36.0)
MCV: 85.1 fl (ref 78.0–100.0)
Platelets: 338 10*3/uL (ref 150.0–400.0)
RBC: 4.72 Mil/uL (ref 3.87–5.11)
RDW: 16.3 % — ABNORMAL HIGH (ref 11.5–15.5)
WBC: 5.2 10*3/uL (ref 4.0–10.5)

## 2023-07-23 LAB — HEPATITIS C ANTIBODY: Hepatitis C Ab: NONREACTIVE

## 2023-08-04 ENCOUNTER — Ambulatory Visit: Admitting: Family Medicine

## 2023-08-25 ENCOUNTER — Ambulatory Visit: Payer: Medicare HMO | Attending: Surgery

## 2023-08-25 VITALS — Wt 160.2 lb

## 2023-08-25 DIAGNOSIS — Z483 Aftercare following surgery for neoplasm: Secondary | ICD-10-CM | POA: Insufficient documentation

## 2023-08-25 NOTE — Therapy (Signed)
 OUTPATIENT PHYSICAL THERAPY [facility] SCREENING NOTE   Patient Name: Linda Wood MRN: [id number] DOB:31-May-1955, 68 y.o., female Today's Date: 08/25/2023  PCP: Jobe Mulder, DO REFERRING PROVIDER: Dareen Ebbing, MD   PT End of Session - 08/25/23 [phone]     Visit Number 6   # unchanged due to screen only   PT Start Time 0947    PT Stop Time 0951    PT Time Calculation (min) 4 min    Activity Tolerance Patient tolerated treatment well    Behavior During Therapy [facility] for tasks assessed/performed             Past Medical History:  Diagnosis Date   Anxiety    Breast cancer (HCC)    Depression    Hypertension    Rheumatoid arthritis (HCC)    Past Surgical History:  Procedure Laterality Date   BREAST BIOPSY Left 06/20/2022   US  LT BREAST BX W LOC DEV 1ST LESION IMG BX SPEC US  GUIDE 06/20/2022 [facility]   BREAST BIOPSY Left 06/20/2022   MM LT BREAST BX W LOC DEV 1ST LESION IMAGE BX SPEC STEREO GUIDE 06/20/2022 [facility]   BREAST BIOPSY  07/15/2022   MM LT RADIOACTIVE SEED LOC MAMMO GUIDE 07/15/2022 [facility]   BREAST BIOPSY  07/15/2022   MM LT RADIOACTIVE SEED EA ADD LESION LOC MAMMO GUIDE 07/15/2022 [facility]   BREAST LUMPECTOMY WITH RADIOACTIVE SEED AND SENTINEL LYMPH NODE BIOPSY Left 07/16/2022   Procedure: LEFT BREAST LUMPECTOMY WITH RADIOACTIVE SEED X2 AND AXILLARY SENTINEL LYMPH NODE BIOPSY;  Surgeon: Dareen Ebbing, MD;  Location: [facility];  Service: General;  Laterality: Left;   CATARACT EXTRACTION, BILATERAL     08/14/2022, 08/28/2022   GASTRIC BYPASS     HAND SURGERY Right    LASIK     RE-EXCISION OF BREAST LUMPECTOMY Left 07/24/2022   Procedure: RE-EXCISION OF LEFT BREAST LUMPECTOMY ANTERIOR AND LATERAL MARGINS;  Surgeon: Dareen Ebbing, MD;  Location: [facility];  Service: General;  Laterality: Left;  45 MIN ROOM 12   Patient Active Problem List   Diagnosis Date Noted   Genetic testing 07/05/2022   Family history of breast  cancer 06/27/2022   Malignant neoplasm of upper-outer quadrant of left breast in female, estrogen receptor positive (HCC) 06/25/2022   Psychophysiological insomnia 11/28/2021   Rheumatoid arthritis (HCC) 10/29/2021   Essential hypertension 10/29/2021   Depression, major, single episode, complete remission (HCC) 10/29/2021   Rib fractures 10/15/2021    REFERRING DIAG: left breast cancer at risk for lymphedema  THERAPY DIAG: Aftercare following surgery for neoplasm  PERTINENT HISTORY:  Patient was diagnosed on 06/20/2022 with left grade 2 invasive ductal carcinoma breast cancer. It measures 7 mm and is located in the upper outer quadrant. It is ER/PR positive and HER2 negative with a Ki67 of 10% . She had a double  left lumpectomy with SLNB on 07/16/2022, followed by a re-excision on 07/24/2022 to get clear margins. She had radiation from 09/09/2022-10/07/2022 and is currently taking Anastrozole .   PRECAUTIONS: left UE Lymphedema risk, None  SUBJECTIVE: Pt returns for her 3 month L-Dex screen.   PAIN:  Are you having pain? No  SOZO SCREENING: Patient was assessed today using the SOZO machine to determine the lymphedema index score. This was compared to her baseline score. It was determined that she is within the recommended range when compared to her baseline and no further action is needed at this time. She will continue SOZO screenings. These are done  every 3 months for 2 years post operatively followed by every 6 months for 2 years, and then annually.   L-DEX FLOWSHEETS - 08/25/23 0900       L-DEX LYMPHEDEMA SCREENING   Measurement Type Unilateral    L-DEX MEASUREMENT EXTREMITY Upper Extremity    POSITION  Standing    DOMINANT SIDE Right    At Risk Side Left    BASELINE SCORE (UNILATERAL) 8.2    L-DEX SCORE (UNILATERAL) 9.8    VALUE CHANGE (UNILAT) 1.6               Denyce Flank, PTA 08/25/2023, 9:51 AM

## 2023-09-03 NOTE — Progress Notes (Signed)
 Office Visit Note  Patient: Linda Wood             Date of Birth: 03/31/1956           MRN: [id number]             PCP: Jobe Mulder, DO Referring: Jobe Mulder* Visit Date: 09/17/2023 Occupation: @GUAROCC @  Subjective:  Medication management   History of Present Illness: [name] is a 68 y.o. female with seropositive rheumatoid arthritis and osteoarthritis.  She returns today after her last visit in November 2024.  She states this spring she had a mild flare which lasted for couple of days.  She states she has been very active and the activities used in her hand.  She noticed some discomfort in her right wrist and right hand and then it resolved.  None of the joints are painful now.  She is on hydroxychloroquine  200 mg daily which she has been taking without any interruptions.  Patient states that she had an eye examination in January 2025 at Dr. Adonis Alamin office which was normal.    Activities of Daily Living:  Patient reports morning stiffness for 0 minute.   Patient Denies nocturnal pain.  Difficulty dressing/grooming: Denies Difficulty climbing stairs: Denies Difficulty getting out of chair: Denies Difficulty using hands for taps, buttons, cutlery, and/or writing: Reports  Review of Systems  Constitutional:  Negative for fatigue.  HENT:  Negative for mouth sores and mouth dryness.   Eyes:  Negative for dryness.  Respiratory:  Negative for shortness of breath.   Cardiovascular:  Negative for chest pain and palpitations.  Gastrointestinal:  Positive for diarrhea. Negative for blood in stool and constipation.  Endocrine: Negative for increased urination.  Genitourinary:  Positive for involuntary urination.  Musculoskeletal:  Positive for joint pain, joint pain, myalgias and myalgias. Negative for gait problem, joint swelling, muscle weakness, morning stiffness and muscle tenderness.  Skin:  Positive for sensitivity to sunlight. Negative for  color change, rash and hair loss.  Allergic/Immunologic: Negative for susceptible to infections.  Neurological:  Positive for dizziness. Negative for headaches.  Hematological:  Negative for swollen glands.  Psychiatric/Behavioral:  Negative for depressed mood and sleep disturbance. The patient is not nervous/anxious.     PMFS History:  Patient Active Problem List   Diagnosis Date Noted   Genetic testing 07/05/2022   Family history of breast cancer 06/27/2022   Malignant neoplasm of upper-outer quadrant of left breast in female, estrogen receptor positive (HCC) 06/25/2022   Psychophysiological insomnia 11/28/2021   Rheumatoid arthritis (HCC) 10/29/2021   Essential hypertension 10/29/2021   Depression, major, single episode, complete remission (HCC) 10/29/2021   Rib fractures 10/15/2021    Past Medical History:  Diagnosis Date   Anxiety    Breast cancer (HCC)    Depression    Hypertension    Rheumatoid arthritis (HCC)     Family History  Problem Relation Age of Onset   Heart failure Mother    Breast cancer Mother 69   Past Surgical History:  Procedure Laterality Date   BREAST BIOPSY Left 06/20/2022   US  LT BREAST BX W LOC DEV 1ST LESION IMG BX SPEC US  GUIDE 06/20/2022 [facility]   BREAST BIOPSY Left 06/20/2022   MM LT BREAST BX W LOC DEV 1ST LESION IMAGE BX SPEC STEREO GUIDE 06/20/2022 [facility]   BREAST BIOPSY  07/15/2022   MM LT RADIOACTIVE SEED LOC MAMMO GUIDE 07/15/2022 [facility]   BREAST BIOPSY  07/15/2022  MM LT RADIOACTIVE SEED EA ADD LESION LOC MAMMO GUIDE 07/15/2022 [facility]   BREAST LUMPECTOMY WITH RADIOACTIVE SEED AND SENTINEL LYMPH NODE BIOPSY Left 07/16/2022   Procedure: LEFT BREAST LUMPECTOMY WITH RADIOACTIVE SEED X2 AND AXILLARY SENTINEL LYMPH NODE BIOPSY;  Surgeon: Dareen Ebbing, MD;  Location: [facility];  Service: General;  Laterality: Left;   CATARACT EXTRACTION, BILATERAL     08/14/2022, 08/28/2022   GASTRIC BYPASS     HAND  SURGERY Right    LASIK     RE-EXCISION OF BREAST LUMPECTOMY Left 07/24/2022   Procedure: RE-EXCISION OF LEFT BREAST LUMPECTOMY ANTERIOR AND LATERAL MARGINS;  Surgeon: Dareen Ebbing, MD;  Location: [facility];  Service: General;  Laterality: Left;  45 MIN ROOM 12   Social History   Social History Narrative   Not on file   Immunization History  Administered Date(s) Administered   Fluad Quad(high Dose 65+) 05/02/2020, 05/03/2022   Influenza, Quadrivalent, Recombinant, Inj, Pf 05/21/2017   Influenza,inj,Quad PF,6+ Mos 03/17/2015   Moderna Sars-Covid-2 Vaccination 05/02/2020   PFIZER(Purple Top)SARS-COV-2 Vaccination 07/11/2019, 08/01/2019   PNEUMOCOCCAL CONJUGATE-20 10/04/2020   Tdap 11/03/2021   Zoster Recombinant(Shingrix) 10/06/2017, 12/07/2019, 02/25/2020     Objective: Vital Signs: BP (!) 152/85 (BP Location: Left Arm, Patient Position: Sitting, Cuff Size: Normal)   Pulse 81   Resp 14   Ht 5' (1.524 m)   Wt 159 lb 3.2 oz (72.2 kg)   BMI 31.09 kg/m    Physical Exam Vitals and nursing note reviewed.  Constitutional:      Appearance: She is well-developed.  HENT:     Head: Normocephalic and atraumatic.  Eyes:     Conjunctiva/sclera: Conjunctivae normal.  Cardiovascular:     Rate and Rhythm: Normal rate and regular rhythm.     Heart sounds: Normal heart sounds.  Pulmonary:     Effort: Pulmonary effort is normal.     Breath sounds: Normal breath sounds.  Abdominal:     General: Bowel sounds are normal.     Palpations: Abdomen is soft.  Musculoskeletal:     Cervical back: Normal range of motion.  Lymphadenopathy:     Cervical: No cervical adenopathy.  Skin:    General: Skin is warm and dry.     Capillary Refill: Capillary refill takes less than 2 seconds.  Neurological:     Mental Status: She is alert and oriented to person, place, and time.  Psychiatric:        Behavior: Behavior normal.      Musculoskeletal Exam: She had limited lateral rotation of the cervical  spine.  Thoracic kyphosis was noted.  She had no tenderness over thoracic or lumbar region.  Shoulders, elbows, wrists, MCPs PIPs and DIPs with good range of motion with no synovitis.  Hip joints and knee joints with good range of motion without any warmth swelling or effusion.  There was no tenderness over ankles or MTPs.  CDAI Exam: CDAI Score: -- Patient Global: --; Provider Global: -- Swollen: --; Tender: -- Joint Exam 09/17/2023   No joint exam has been documented for this visit   There is currently no information documented on the homunculus. Go to the Rheumatology activity and complete the homunculus joint exam.  Investigation: No additional findings.  Imaging: No results found.  Recent Labs: Lab Results  Component Value Date   WBC 5.2 07/22/2023   HGB 13.0 07/22/2023   PLT 338.0 07/22/2023   NA 140 07/22/2023   K 4.4 07/22/2023   CL 105  07/22/2023   CO2 27 07/22/2023   GLUCOSE 91 07/22/2023   BUN 13 07/22/2023   CREATININE 0.60 07/22/2023   BILITOT 0.5 07/22/2023   ALKPHOS 87 07/22/2023   AST 13 07/22/2023   ALT 6 07/22/2023   PROT 6.5 07/22/2023   ALBUMIN 4.0 07/22/2023   CALCIUM 10.3 07/22/2023    Speciality Comments: PLQ Eye Exam: 05/01/2023 WNL Follow up in 6 months Hydroxychloroquine  200 mg p.o. daily since 2011.    Procedures:  No procedures performed Allergies: Dextromethorphan, Dextromethorphan-guaifenesin, and Promethazine   Assessment / Plan:     Visit Diagnoses: Rheumatoid arthritis involving multiple sites, unspecified whether rheumatoid factor present (HCC) - +RF, +anti-CCP. Dxd in [place] 2000.Ttd with PLQ 200 mg po qd since 2011. Patient reports having a mild flare in her right hand lasting for 2 days about 2 months ago.  She had no recurrence of symptoms.  She believes it could be due to doing extra household work.  No synovitis was noted.  She denies any joint pain today.  High risk medication use - Hydroxychloroquine  200 mg p.o. daily since  2011.  Eye examination May 01, 2023 by Dr. Zelpha Hides.  She was advised to return for a follow-up visit in 6 months.  She is advised to get repeat labs in August.  Information on immunization was placed in the AVS.  Primary osteoarthritis of both hands -she has rheumatoid arthritis and osteoarthritis overlap.  Bilateral PIP and DIP thickening with no synovitis was noted.  Joint protection was discussed.  X-rays obtained in the past were consistent with osteoarthritis.  Primary osteoarthritis of both knees-she denies a discomfort in her knee joints today.  Pain in both feet -proper fitting shoes were advised.  Recently she is not having much pain in her knees.  X-rays obtained in the past showed osteoarthritic changes.  Postural kyphosis of thoracic region-upper back exercises were demonstrated and discussed.  A handout on thoracic  exercises was given.  Osteopenia of multiple sites - November 05, 2022 T score -2.2. -Patient reports that she has not been taking calcium on a regular basis.  She has been on vitamin D  50,000 units once a week for many years.  Last vitamin D  level was 15.9 on September 28, 2020.  I will check labs today.  Calcium rich diet was advised.  Based on her vitamin D  level we may call in a prescription for vitamin D .  Plan: Parathyroid hormone, intact (no Ca), Phosphorus  Essential hypertension-blood pressure was elevated at 152/85.  Repeat blood pressure was 145/90.  She was advised to monitor blood pressure closely and follow-up with her PCP.  Depression, major, single episode, complete remission (HCC)-she is on Lexapro .  She has situational depression.  Psychophysiological insomnia  Malignant neoplasm of upper-outer quadrant of left breast in female, estrogen receptor positive (HCC) - dxd 02/24 lumpectomy, s/p RTX, no CTX ttd by Dr. Arno Bibles.  She is currently on Arimidex .  Vitamin D  deficiency -she was diagnosed with vitamin D  deficiency several years ago.  It is most likely related to  gastric bypass.  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  Plan: Gliadin antibodies, serum, Tissue transglutaminase, IgA  Other fatigue -she continues to have some fatigue.  Plan: Serum protein electrophoresis with reflex, TSH  Orders: Orders Placed This Encounter  Procedures   Parathyroid hormone, intact (no Ca)   VITAMIN D  25 Hydroxy (Vit-D Deficiency, Fractures)  Phosphorus   Gliadin antibodies, serum   Tissue transglutaminase, IgA   Serum protein electrophoresis with reflex   TSH   Meds ordered this encounter  Medications   hydroxychloroquine  (PLAQUENIL ) 200 MG tablet    Sig: Take 1 tablet (200 mg total) by mouth daily.    Dispense:  90 tablet    Refill:  1     Follow-Up Instructions: Return in about 5 months (around 02/17/2024) for Rheumatoid arthritis, Osteoarthritis.   Nicholas Bari, MD  Note - This record has been created using Animal nutritionist.  Chart creation errors have been sought, but may not always  have been located. Such creation errors do not reflect on  the standard of medical care.

## 2023-09-11 ENCOUNTER — Other Ambulatory Visit: Payer: Self-pay

## 2023-09-11 ENCOUNTER — Encounter: Payer: Self-pay | Admitting: *Deleted

## 2023-09-11 ENCOUNTER — Other Ambulatory Visit: Payer: Self-pay | Admitting: Family Medicine

## 2023-09-11 ENCOUNTER — Other Ambulatory Visit (HOSPITAL_BASED_OUTPATIENT_CLINIC_OR_DEPARTMENT_OTHER): Payer: Self-pay

## 2023-09-11 DIAGNOSIS — F5104 Psychophysiologic insomnia: Secondary | ICD-10-CM

## 2023-09-11 DIAGNOSIS — I10 Essential (primary) hypertension: Secondary | ICD-10-CM

## 2023-09-11 MED ORDER — TRAZODONE HCL 50 MG PO TABS
25.0000 mg | ORAL_TABLET | Freq: Every evening | ORAL | 0 refills | Status: DC | PRN
  Filled 2023-09-11: qty 30, 30d supply, fill #0

## 2023-09-11 MED ORDER — LOSARTAN POTASSIUM 50 MG PO TABS
50.0000 mg | ORAL_TABLET | Freq: Every morning | ORAL | 0 refills | Status: DC
Start: 2023-09-11 — End: 2023-09-29
  Filled 2023-09-11: qty 30, 30d supply, fill #0

## 2023-09-17 ENCOUNTER — Encounter: Payer: Self-pay | Admitting: Rheumatology

## 2023-09-17 ENCOUNTER — Other Ambulatory Visit (HOSPITAL_BASED_OUTPATIENT_CLINIC_OR_DEPARTMENT_OTHER): Payer: Self-pay

## 2023-09-17 ENCOUNTER — Ambulatory Visit: Payer: Medicare HMO | Attending: Rheumatology | Admitting: Rheumatology

## 2023-09-17 VITALS — BP 145/90 | HR 80 | Resp 14 | Ht 60.0 in | Wt 159.2 lb

## 2023-09-17 DIAGNOSIS — K909 Intestinal malabsorption, unspecified: Secondary | ICD-10-CM

## 2023-09-17 DIAGNOSIS — Z79899 Other long term (current) drug therapy: Secondary | ICD-10-CM

## 2023-09-17 DIAGNOSIS — F5104 Psychophysiologic insomnia: Secondary | ICD-10-CM

## 2023-09-17 DIAGNOSIS — M19041 Primary osteoarthritis, right hand: Secondary | ICD-10-CM | POA: Diagnosis not present

## 2023-09-17 DIAGNOSIS — E559 Vitamin D deficiency, unspecified: Secondary | ICD-10-CM | POA: Diagnosis not present

## 2023-09-17 DIAGNOSIS — M79672 Pain in left foot: Secondary | ICD-10-CM

## 2023-09-17 DIAGNOSIS — R197 Diarrhea, unspecified: Secondary | ICD-10-CM | POA: Diagnosis not present

## 2023-09-17 DIAGNOSIS — R5383 Other fatigue: Secondary | ICD-10-CM | POA: Diagnosis not present

## 2023-09-17 DIAGNOSIS — M4004 Postural kyphosis, thoracic region: Secondary | ICD-10-CM

## 2023-09-17 DIAGNOSIS — I10 Essential (primary) hypertension: Secondary | ICD-10-CM

## 2023-09-17 DIAGNOSIS — C50412 Malignant neoplasm of upper-outer quadrant of left female breast: Secondary | ICD-10-CM

## 2023-09-17 DIAGNOSIS — M170 Bilateral primary osteoarthritis of knee: Secondary | ICD-10-CM | POA: Diagnosis not present

## 2023-09-17 DIAGNOSIS — M79671 Pain in right foot: Secondary | ICD-10-CM

## 2023-09-17 DIAGNOSIS — Z170 Estrogen receptor positive status [ER+]: Secondary | ICD-10-CM

## 2023-09-17 DIAGNOSIS — M069 Rheumatoid arthritis, unspecified: Secondary | ICD-10-CM | POA: Diagnosis not present

## 2023-09-17 DIAGNOSIS — M8589 Other specified disorders of bone density and structure, multiple sites: Secondary | ICD-10-CM | POA: Diagnosis not present

## 2023-09-17 DIAGNOSIS — Z9884 Bariatric surgery status: Secondary | ICD-10-CM

## 2023-09-17 DIAGNOSIS — M19042 Primary osteoarthritis, left hand: Secondary | ICD-10-CM

## 2023-09-17 DIAGNOSIS — F325 Major depressive disorder, single episode, in full remission: Secondary | ICD-10-CM

## 2023-09-17 MED ORDER — HYDROXYCHLOROQUINE SULFATE 200 MG PO TABS
200.0000 mg | ORAL_TABLET | Freq: Every day | ORAL | 1 refills | Status: DC
  Filled 2023-09-17: qty 90, 90d supply, fill #0
  Filled 2024-01-23: qty 90, 90d supply, fill #1

## 2023-09-17 NOTE — Patient Instructions (Addendum)
 Standing Labs We placed an order today for your standing lab work.   Please have your standing labs drawn in August  Please have your labs drawn 2 weeks prior to your appointment so that the provider can discuss your lab results at your appointment, if possible.  Please note that you may see your imaging and lab results in MyChart before we have reviewed them. We will contact you once all results are reviewed. Please allow our office up to 72 hours to thoroughly review all of the results before contacting the office for clarification of your results.  WALK-IN LAB HOURS  Monday through Thursday from 8:00 am -12:30 pm and 1:00 pm-4:00 pm and Friday from 8:00 am-12:00 pm.  Patients with office visits requiring labs will be seen before walk-in labs.  You may encounter longer than normal wait times. Please allow additional time. Wait times may be shorter on  Monday and Thursday afternoons.  We do not book appointments for walk-in labs. We appreciate your patience and understanding with our staff.   Labs are drawn by [facility]. Please bring your co-pay at the time of your lab draw.  You may receive a bill from [facility] for your lab work.  Please note if you are on Hydroxychloroquine  and and an order has been placed for a Hydroxychloroquine  level,  you will need to have it drawn 4 hours or more after your last dose.  If you wish to have your labs drawn at another location, please call the office 24 hours in advance so we can fax the orders.  The office is located at [address], [place], [place] [address]   If you have any questions regarding directions or hours of operation,  please call [phone].   As a reminder, please drink plenty of water prior to coming for your lab work. Thanks!  Vaccines You are taking a medication(s) that can suppress your immune system.  The following immunizations are recommended: Flu annually Covid-19  Td/Tdap (tetanus, diphtheria, pertussis) every  10 years Pneumonia (Prevnar 15 then Pneumovax 23 at least 1 year apart.  Alternatively, can take Prevnar 20 without needing additional dose) Shingrix: 2 doses from 4 weeks to 6 months apart  Please check with your PCP to make sure you are up to date.   Thoracic Strain Rehab Ask your health care provider which exercises are safe for you. Do exercises exactly as told by your provider and adjust them as directed. It is normal to feel mild stretching, pulling, tightness, or discomfort as you do these exercises. Stop right away if you feel sudden pain or your pain gets worse. Do not begin these exercises until told by your provider. Stretching and range-of-motion exercise This exercise warms up your muscles and joints and improves the movement and flexibility of your back and shoulders. This exercise also helps to relieve pain. Chest and spine stretch  Lie down on your back on a firm surface. Roll a towel or a small blanket so it is about 4 inches (10 cm) in diameter. Put the towel under the middle of your back so it is under your spine, but not under your shoulder blades. Put your hands behind your head and let your elbows fall to your sides. This will increase your stretch. Take a deep breath (inhale). Hold for __________ seconds. Relax after you breathe out (exhale). Repeat __________ times. Complete this exercise __________ times a day. Strengthening exercises These exercises build strength and endurance in your back and  your shoulder blade muscles. Endurance is the ability to use your muscles for a long time, even after they get tired. Alternating arm and leg raises  Get on your hands and knees on a firm surface. If you are on a hard floor, you may want to use padding, such as an exercise mat, to cushion your knees. Line up your arms and legs. Your hands should be directly below your shoulders, and your knees should be directly below your hips. Lift your left leg behind you. At the same  time, raise your right arm and straighten it in front of you. Do not lift your leg higher than your hip. Do not lift your arm higher than your shoulder. Keep your abdominal and back muscles tight. Keep your hips facing the ground. Do not arch your back. Carefully stay balanced. Do not hold your breath. Hold for __________ seconds. Slowly return to the starting position and repeat with your right leg and your left arm. Repeat __________ times. Complete this exercise __________ times a day. Straight arm rows This exercise is also called the shoulder extension exercise. Stand with your feet shoulder width apart. Secure an exercise band to a stable object in front of you so the band is at or above shoulder height. Hold one end of the exercise band in each hand. Straighten your elbows and lift your hands up to shoulder height. Step back, away from the secured end of the exercise band, until the band stretches. Squeeze your shoulder blades together and pull your hands down to the sides of your thighs. Stop when your hands are straight down by your sides. This is shoulder extension. Do not let your hands go behind your body. Hold for __________ seconds. Slowly return to the starting position. Repeat __________ times. Complete this exercise __________ times a day. Rowing scapular retraction This is an exercise in which the shoulder blades (scapulae) are pulled toward each other (retraction). Sit in a stable chair without armrests, or stand up. Secure an exercise band to a stable object in front of you so the band is at shoulder height. Hold one end of the exercise band in each hand. Your palms should face toward each other. Bring your arms out straight in front of you. Step back, away from the secured end of the exercise band, until the band stretches. Pull the band backward. As you do this, bend your elbows and squeeze your shoulder blades together, but avoid letting the rest of your body move.  Do not shrug your shoulders upward while you do this. Stop when your elbows are at your sides or slightly behind your body. Hold for __________ seconds. Slowly straighten your arms to return to the starting position. Repeat __________ times. Complete this exercise __________ times a day. Posture and body mechanics Good posture and healthy body mechanics can help to relieve stress in your body's tissues and joints. Body mechanics refers to the movements and positions of your body while you do your daily activities. Posture is part of body mechanics. Good posture means: Your spine is in its natural S-curve position (neutral). Your shoulders are pulled back slightly. Your head is not tipped forward. Follow these guidelines to improve your posture and body mechanics in your everyday activities. Standing  When standing, keep your spine neutral and your feet about hip width apart. Keep a slight bend in your knees. Your ears, shoulders, and hips should line up with each other. When you do a task in which you lean  forward while standing in one place for a long time, place one foot up on a stable object that is 2-4 inches (5-10 cm) high, such as a footstool. This helps keep your spine neutral. Sitting  When sitting, keep your spine neutral and keep your feet flat on the floor. Use a footrest if needed. Keep your thighs parallel to the floor. Avoid rounding your shoulders, and avoid tilting your head forward. When working at a desk or a computer, keep your desk at a height where your hands are slightly lower than your elbows. Slide your chair under your desk so you are close enough to maintain good posture. When working at a computer, place your monitor at a height where you are looking straight ahead and you do not have to tilt your head forward or downward to look at the screen. Resting When lying down and resting, avoid positions that are most painful for you. If you have pain with activities such as  sitting, bending, stooping, or squatting (flexion-basedactivities), lie in a position in which your body does not bend very much. For example, avoid curling up on your side with your arms and knees near your chest (fetal position). If you have pain with activities such as standing for a long time or reaching with your arms (extension-basedactivities), lie with your spine in a neutral position and bend your knees slightly. Try the following positions: Lie on your side with a pillow between your knees. Lie on your back with a pillow under your knees.  Lifting  When lifting objects, keep your feet at least shoulder width apart and tighten your abdominal muscles. Bend your knees and hips and keep your spine neutral. It is important to lift using the strength of your legs, not your back. Do not lock your knees straight out. Always ask for help to lift heavy or awkward objects. This information is not intended to replace advice given to you by your health care provider. Make sure you discuss any questions you have with your health care provider. Document Revised: 09/27/2022 Document Reviewed: 12/03/2021 Elsevier Patient Education  2024 [employer].

## 2023-09-18 ENCOUNTER — Ambulatory Visit: Payer: Self-pay | Admitting: Rheumatology

## 2023-09-18 DIAGNOSIS — R899 Unspecified abnormal finding in specimens from other organs, systems and tissues: Secondary | ICD-10-CM

## 2023-09-18 NOTE — Progress Notes (Signed)
 PTH is elevated.  Please refer patient to endocrinology for evaluation.

## 2023-09-19 LAB — VITAMIN D 25 HYDROXY (VIT D DEFICIENCY, FRACTURES): Vit D, 25-Hydroxy: 30 ng/mL (ref 30–100)

## 2023-09-19 LAB — TISSUE TRANSGLUTAMINASE, IGA: (tTG) Ab, IgA: <1 U/mL

## 2023-09-19 LAB — PROTEIN ELECTROPHORESIS, SERUM, WITH REFLEX
Albumin ELP: 4.4 g/dL (ref 3.8–4.8)
Alpha 1: 0.3 g/dL (ref 0.2–0.3)
Alpha 2: 0.7 g/dL (ref 0.5–0.9)
Beta 2: 0.3 g/dL (ref 0.2–0.5)
Beta Globulin: 0.5 g/dL (ref 0.4–0.6)
Gamma Globulin: 0.9 g/dL (ref 0.8–1.7)
Total Protein: 7.1 g/dL (ref 6.1–8.1)

## 2023-09-19 LAB — PARATHYROID HORMONE, INTACT (NO CA): PTH: 105 pg/mL — ABNORMAL HIGH (ref 16–77)

## 2023-09-19 LAB — GLIADIN ANTIBODIES, SERUM
Gliadin IgA: <1 U/mL
Gliadin IgG: <1 U/mL

## 2023-09-19 LAB — TSH: TSH: 4.33 m[IU]/L (ref 0.40–4.50)

## 2023-09-19 LAB — PHOSPHORUS: Phosphorus: 3.3 mg/dL (ref 2.1–4.3)

## 2023-09-19 NOTE — Progress Notes (Signed)
 Vitamin D  is low normal.  Patient should continue current dose of vitamin D .  Phosphorus is normal.  Antibodies against celiac disease are negative.  Thyroid functions are normal.  SPEP pending.

## 2023-09-21 NOTE — Progress Notes (Signed)
SPEP is normal.

## 2023-09-29 ENCOUNTER — Other Ambulatory Visit (HOSPITAL_BASED_OUTPATIENT_CLINIC_OR_DEPARTMENT_OTHER): Payer: Self-pay

## 2023-09-29 ENCOUNTER — Encounter: Payer: Self-pay | Admitting: Family Medicine

## 2023-09-29 ENCOUNTER — Ambulatory Visit (INDEPENDENT_AMBULATORY_CARE_PROVIDER_SITE_OTHER): Admitting: Family Medicine

## 2023-09-29 VITALS — BP 132/78 | HR 86 | Temp 98.0°F | Resp 16 | Ht 61.0 in | Wt 157.0 lb

## 2023-09-29 DIAGNOSIS — B001 Herpesviral vesicular dermatitis: Secondary | ICD-10-CM | POA: Diagnosis not present

## 2023-09-29 DIAGNOSIS — I10 Essential (primary) hypertension: Secondary | ICD-10-CM

## 2023-09-29 DIAGNOSIS — F5104 Psychophysiologic insomnia: Secondary | ICD-10-CM | POA: Diagnosis not present

## 2023-09-29 MED ORDER — VALACYCLOVIR HCL 1 G PO TABS
ORAL_TABLET | ORAL | 1 refills | Status: AC
  Filled 2023-09-29: qty 30, 7d supply, fill #0

## 2023-09-29 MED ORDER — LOSARTAN POTASSIUM 50 MG PO TABS
50.0000 mg | ORAL_TABLET | Freq: Every morning | ORAL | 1 refills | Status: AC
  Filled 2023-09-29 – 2023-10-29 (×3): qty 90, 90d supply, fill #0
  Filled 2024-01-23: qty 90, 90d supply, fill #1

## 2023-09-29 MED ORDER — TRAZODONE HCL 50 MG PO TABS
25.0000 mg | ORAL_TABLET | Freq: Every evening | ORAL | 1 refills | Status: AC | PRN
  Filled 2023-09-29 – 2023-10-28 (×2): qty 90, 90d supply, fill #0
  Filled 2024-01-23: qty 90, 90d supply, fill #1

## 2023-09-29 NOTE — Progress Notes (Signed)
 Chief Complaint  Patient presents with   Medication Refill    Medication Refill    Subjective Linda Wood is a 68 y.o. female who presents for hypertension follow up. She does monitor home blood pressures. Blood pressures ranging from 130's/80's on average. She is compliant with medication-losartan  50 mg daily. Patient has these side effects of medication: none She is sometimes adhering to a healthy diet overall. Current exercise: none No CP or SOB.   Anxiety/insomnia: Patient has a history of insomnia.  She takes trazodone  25-50 mg nightly as needed.  She reports compliance notable effects.  It is working well.  She is no longer taking escitalopram  daily.  Mood is stable overall.   Past Medical History:  Diagnosis Date   Anxiety    Breast cancer (HCC)    Depression    Hypertension    Rheumatoid arthritis (HCC)     Exam BP 132/78 (BP Location: Right Arm, Cuff Size: Normal)   Pulse 86   Temp 98 F (36.7 C) (Oral)   Resp 16   Ht 5\' 1"  (1.549 m)   Wt 157 lb (71.2 kg)   SpO2 95%   BMI 29.66 kg/m  General:  well developed, well nourished, in no apparent distress Heart: RRR, no bruits, no LE edema Lungs: clear to auscultation, no accessory muscle use Psych: well oriented with normal range of affect and appropriate judgment/insight  Essential hypertension - Plan: losartan  (COZAAR ) 50 MG tablet  Psychophysiological insomnia - Plan: traZODone  (DESYREL ) 50 MG tablet  Recurrent cold sores  Chronic, stable.  Continue losartan  50 mg daily.  Counseled on diet and exercise. Chronic, stable.  Continue trazodone  25 to 60 mg nightly as needed. Valtrex sent. F/u in 6 months for physical or as needed. The patient voiced understanding and agreement to the plan.  Shellie Dials [place], DO 09/29/23  3:05 PM

## 2023-09-29 NOTE — Patient Instructions (Addendum)
 Keep the diet clean and stay active.  Aim to do some physical exertion for 150 minutes per week. This is typically divided into 5 days per week, 30 minutes per day. The activity should be enough to get your heart rate up. Anything is better than nothing if you have time constraints.  Please consider adding some weight resistance exercise to your routine. Consider yoga as well.   For this outbreak, take 1 tab twice daily for 7 days.   Let us  know if you need anything.

## 2023-10-28 ENCOUNTER — Other Ambulatory Visit (HOSPITAL_BASED_OUTPATIENT_CLINIC_OR_DEPARTMENT_OTHER): Payer: Self-pay

## 2023-10-28 ENCOUNTER — Other Ambulatory Visit: Payer: Self-pay

## 2023-10-28 ENCOUNTER — Other Ambulatory Visit (HOSPITAL_COMMUNITY): Payer: Self-pay

## 2023-10-29 ENCOUNTER — Other Ambulatory Visit (HOSPITAL_BASED_OUTPATIENT_CLINIC_OR_DEPARTMENT_OTHER): Payer: Self-pay

## 2023-10-29 ENCOUNTER — Other Ambulatory Visit: Payer: Self-pay

## 2023-10-29 DIAGNOSIS — Z79899 Other long term (current) drug therapy: Secondary | ICD-10-CM | POA: Diagnosis not present

## 2023-11-19 DIAGNOSIS — Z853 Personal history of malignant neoplasm of breast: Secondary | ICD-10-CM | POA: Diagnosis not present

## 2023-11-19 DIAGNOSIS — Z9884 Bariatric surgery status: Secondary | ICD-10-CM | POA: Diagnosis not present

## 2023-11-19 DIAGNOSIS — Z79811 Long term (current) use of aromatase inhibitors: Secondary | ICD-10-CM | POA: Diagnosis not present

## 2023-11-19 DIAGNOSIS — E349 Endocrine disorder, unspecified: Secondary | ICD-10-CM | POA: Diagnosis not present

## 2023-11-19 DIAGNOSIS — M8580 Other specified disorders of bone density and structure, unspecified site: Secondary | ICD-10-CM | POA: Diagnosis not present

## 2023-11-24 ENCOUNTER — Ambulatory Visit: Attending: Adult Health

## 2023-11-24 VITALS — Wt 161.5 lb

## 2023-11-24 DIAGNOSIS — Z483 Aftercare following surgery for neoplasm: Secondary | ICD-10-CM | POA: Insufficient documentation

## 2023-11-24 NOTE — Therapy (Signed)
 OUTPATIENT PHYSICAL THERAPY SOZO SCREENING NOTE   Patient Name: Linda Wood MRN: [id number] DOB:[birth year], 67 y.o., female Today's Date: 11/24/2023  PCP: Frann Mabel Mt, DO REFERRING PROVIDER: Crawford Jacobsen Cornett*   PT End of Session - 11/24/23 1521     Visit Number 6   # unchanged due to screen only   PT Start Time 1519    PT Stop Time 1523    PT Time Calculation (min) 4 min    Activity Tolerance Patient tolerated treatment well    Behavior During Therapy WFL for tasks assessed/performed          Past Medical History:  Diagnosis Date   Anxiety    Breast cancer (HCC)    Depression    Hypertension    Rheumatoid arthritis (HCC)    Past Surgical History:  Procedure Laterality Date   BREAST BIOPSY Left 06/20/2022   US  LT BREAST BX W LOC DEV 1ST LESION IMG BX SPEC US  GUIDE 06/20/2022 [facility]   BREAST BIOPSY Left 06/20/2022   MM LT BREAST BX W LOC DEV 1ST LESION IMAGE BX SPEC STEREO GUIDE 06/20/2022 [facility]   BREAST BIOPSY  07/15/2022   MM LT RADIOACTIVE SEED LOC MAMMO GUIDE 07/15/2022 [facility]   BREAST BIOPSY  07/15/2022   MM LT RADIOACTIVE SEED EA ADD LESION LOC MAMMO GUIDE 07/15/2022 [facility]   BREAST LUMPECTOMY WITH RADIOACTIVE SEED AND SENTINEL LYMPH NODE BIOPSY Left 07/16/2022   Procedure: LEFT BREAST LUMPECTOMY WITH RADIOACTIVE SEED X2 AND AXILLARY SENTINEL LYMPH NODE BIOPSY;  Surgeon: Belinda Cough, MD;  Location: [facility];  Service: General;  Laterality: Left;   CATARACT EXTRACTION, BILATERAL     08/14/2022, 08/28/2022   GASTRIC BYPASS     HAND SURGERY Right    LASIK     RE-EXCISION OF BREAST LUMPECTOMY Left 07/24/2022   Procedure: RE-EXCISION OF LEFT BREAST LUMPECTOMY ANTERIOR AND LATERAL MARGINS;  Surgeon: Belinda Cough, MD;  Location: [facility] OR;  Service: General;  Laterality: Left;  45 MIN ROOM 12   Patient Active Problem List   Diagnosis Date Noted   Genetic testing 07/05/2022   Family history of breast  cancer 06/27/2022   Malignant neoplasm of upper-outer quadrant of left breast in female, estrogen receptor positive (HCC) 06/25/2022   Psychophysiological insomnia 11/28/2021   Rheumatoid arthritis (HCC) 10/29/2021   Essential hypertension 10/29/2021   Depression, major, single episode, complete remission (HCC) 10/29/2021   Rib fractures 10/15/2021    REFERRING DIAG: left breast cancer at risk for lymphedema  THERAPY DIAG: Aftercare following surgery for neoplasm  PERTINENT HISTORY:  Patient was diagnosed on 06/20/2022 with left grade 2 invasive ductal carcinoma breast cancer. It measures 7 mm and is located in the upper outer quadrant. It is ER/PR positive and HER2 negative with a Ki67 of 10% . She had a double  left lumpectomy with SLNB on 07/16/2022, followed by a re-excision on 07/24/2022 to get clear margins. She had radiation from 09/09/2022-10/07/2022 and is currently taking Anastrozole .   PRECAUTIONS: left UE Lymphedema risk, None  SUBJECTIVE: Pt returns for her 3 month L-Dex screen.   PAIN:  Are you having pain? No  SOZO SCREENING: Patient was assessed today using the SOZO machine to determine the lymphedema index score. This was compared to her baseline score. It was determined that she is within the recommended range when compared to her baseline and no further action is needed at this time. She will continue SOZO screenings. These are done every 3 months  for 2 years post operatively followed by every 6 months for 2 years, and then annually.   L-DEX FLOWSHEETS - 11/24/23 1500       L-DEX LYMPHEDEMA SCREENING   Measurement Type Unilateral    L-DEX MEASUREMENT EXTREMITY Upper Extremity    POSITION  Standing    DOMINANT SIDE Right    At Risk Side Left    BASELINE SCORE (UNILATERAL) 8.2    L-DEX SCORE (UNILATERAL) 5    VALUE CHANGE (UNILAT) -3.2            Aden Berwyn Caldron, PTA 11/24/2023, 3:22 PM

## 2024-01-09 ENCOUNTER — Other Ambulatory Visit (HOSPITAL_BASED_OUTPATIENT_CLINIC_OR_DEPARTMENT_OTHER): Payer: Self-pay

## 2024-01-09 ENCOUNTER — Inpatient Hospital Stay: Attending: Hematology and Oncology | Admitting: Hematology and Oncology

## 2024-01-09 VITALS — BP 148/90 | HR 83 | Temp 98.7°F | Resp 20 | Wt 162.2 lb

## 2024-01-09 DIAGNOSIS — Z1732 Human epidermal growth factor receptor 2 negative status: Secondary | ICD-10-CM | POA: Insufficient documentation

## 2024-01-09 DIAGNOSIS — M8580 Other specified disorders of bone density and structure, unspecified site: Secondary | ICD-10-CM | POA: Insufficient documentation

## 2024-01-09 DIAGNOSIS — I890 Lymphedema, not elsewhere classified: Secondary | ICD-10-CM | POA: Diagnosis not present

## 2024-01-09 DIAGNOSIS — Z1721 Progesterone receptor positive status: Secondary | ICD-10-CM | POA: Diagnosis not present

## 2024-01-09 DIAGNOSIS — Z79811 Long term (current) use of aromatase inhibitors: Secondary | ICD-10-CM | POA: Diagnosis not present

## 2024-01-09 DIAGNOSIS — Z923 Personal history of irradiation: Secondary | ICD-10-CM | POA: Diagnosis not present

## 2024-01-09 DIAGNOSIS — Z803 Family history of malignant neoplasm of breast: Secondary | ICD-10-CM | POA: Insufficient documentation

## 2024-01-09 DIAGNOSIS — C50412 Malignant neoplasm of upper-outer quadrant of left female breast: Secondary | ICD-10-CM | POA: Insufficient documentation

## 2024-01-09 DIAGNOSIS — Z170 Estrogen receptor positive status [ER+]: Secondary | ICD-10-CM | POA: Insufficient documentation

## 2024-01-09 MED ORDER — ANASTROZOLE 1 MG PO TABS
1.0000 mg | ORAL_TABLET | Freq: Every day | ORAL | 3 refills | Status: AC
  Filled 2024-01-09 – 2024-01-23 (×2): qty 90, 90d supply, fill #0

## 2024-01-09 NOTE — Progress Notes (Signed)
 [facility] Cancer Follow up:    Linda Mabel Mt, DO [address] [place] [address]   DIAGNOSIS:  Cancer Staging  Malignant neoplasm of upper-outer quadrant of left breast in female, estrogen receptor positive (HCC) Staging form: Breast, AJCC 8th Edition - Clinical stage from 06/26/2022: Stage IA (cT1b, cN0, cM0, G2, ER+, PR+, HER2-) - Signed by Lanell Donald Stagger, PA-C on 06/26/2022 Stage prefix: Initial diagnosis Method of lymph node assessment: Clinical Histologic grading system: 3 grade system   SUMMARY OF ONCOLOGIC HISTORY: Oncology History  Malignant neoplasm of upper-outer quadrant of left breast in female, estrogen receptor positive (HCC)  06/11/2022 Mammogram   Bilateral diagnostic mammogram showed suspicious left breast mass at 1 o'clock position.  Ultrasound-guided biopsy was recommended.  There were also indeterminate left breast calcs recommend stereotactic biopsy.  No suspicious left axillary adenopathy.  Right breast normal   06/20/2022 Pathology Results   Pathology results showed grade 2 invasive ductal carcinoma prognostics from the first specimen at 1:00 10 cm from the nipple showed ER 100% positive strong staining PR 20% positive moderate to strong staining and Ki-67 of 10%, tumor cells negative for HER2 1+.  Second biopsy which was from upper outer quadrant coil shaped clip also showed invasive ductal carcinoma grade 2 prognostics for this 1 showed ER 95% positive strong staining PR 0% negative Ki-67 of 15% and HER2 2+ by IHC and FISH negative   06/26/2022 Cancer Staging   Staging form: Breast, AJCC 8th Edition - Clinical stage from 06/26/2022: Stage IA (cT1b, cN0, cM0, G2, ER+, PR+, HER2-) - Signed by Lanell Donald Stagger, PA-C on 06/26/2022 Stage prefix: Initial diagnosis Method of lymph node assessment: Clinical Histologic grading system: 3 grade system    Genetic Testing   [facility] Custom Panel+RNA was Negative. Of  note, a variant of uncertain significance was detected in the NF1 gene (c.5668A>G). Report date was 07/04/2022.  The Custom Hereditary Cancers Panel offered by [facility] includes sequencing and/or deletion duplication testing of the following 43 genes: APC, ATM, AXIN2, BAP1, BARD1, BMPR1A, BRCA1, BRCA2, BRIP1, CDH1, CDK4, CDKN2A (p14ARF and p16INK4a only), CHEK2, CTNNA1, EPCAM (Deletion/duplication testing only), FH, GREM1 (promoter region duplication testing only), HOXB13, KIT, MBD4, MEN1, MLH1, MSH2, MSH3, MSH6, MUTYH, NF1, NHTL1, PALB2, PDGFRA, PMS2, POLD1, POLE, PTEN, RAD51C, RAD51D, SMAD4, SMARCA4. STK11, TP53, TSC1, TSC2, and VHL.  UPDATE: NF1 c.5668A>G (p.Ile1890Val) VUS has been amended to Likely Benign. Amended report date is 08/19/2022.   09/09/2022 - 10/07/2022 Radiation Therapy   Plan Name: Breast_L_BH Site: Breast, Left Technique: 3D Mode: Photon Dose Per Fraction: 2.66 Gy Prescribed Dose (Delivered / Prescribed): 42.56 Gy / 42.56 Gy Prescribed Fxs (Delivered / Prescribed): 16 / 16   Plan Name: Brst_L_Bst_BH Site: Breast, Left Technique: 3D Mode: Photon Dose Per Fraction: 2 Gy Prescribed Dose (Delivered / Prescribed): 8 Gy / 8 Gy Prescribed Fxs (Delivered / Prescribed): 4 / 4   10/2022 -  Anti-estrogen oral therapy   Anastrozole      CURRENT THERAPY: Anastrozole   INTERVAL HISTORY:  Discussed the use of AI scribe software for clinical note transcription with the patient, who gave verbal consent to proceed.  History of Present Illness Linda Wood is a 68 year old female with breast cancer who presents for a follow-up visit.  She has been experiencing slightly elevated blood pressure readings during visits, which she attributes to anxiety and caffeine consumption prior to appointments. She is monitoring her blood pressure at home in a relaxed  environment.  She continues anastrozole  therapy without any new side effects. Initially, she experienced some pain when starting  the medication, but it has since resolved. She has not required any hospital visits recently.  A mammogram conducted in January was normal. She also underwent blood work as requested by her rheumatologist, which did not reveal any concerning findings.  She previously experienced lymphedema in her breast, which has resolved. She had been doing physical therapy but stopped as the condition improved naturally.  She confirms regular bowel movements without any blood in her stool or urine. She attributes her 'fat ankles' to her natural body type.  She has been exercising regularly and has a bone density test scheduled for next year. Her osteopenia is being monitored.   Patient Active Problem List   Diagnosis Date Noted   Genetic testing 07/05/2022   Family history of breast cancer 06/27/2022   Malignant neoplasm of upper-outer quadrant of left breast in female, estrogen receptor positive (HCC) 06/25/2022   Psychophysiological insomnia 11/28/2021   Rheumatoid arthritis (HCC) 10/29/2021   Essential hypertension 10/29/2021   Depression, major, single episode, complete remission (HCC) 10/29/2021   Rib fractures 10/15/2021    is allergic to dextromethorphan, dextromethorphan-guaifenesin, and promethazine.  MEDICAL HISTORY: Past Medical History:  Diagnosis Date   Anxiety    Breast cancer (HCC)    Depression    Hypertension    Rheumatoid arthritis (HCC)     SURGICAL HISTORY: Past Surgical History:  Procedure Laterality Date   BREAST BIOPSY Left 06/20/2022   US  LT BREAST BX W LOC DEV 1ST LESION IMG BX SPEC US  GUIDE 06/20/2022 [facility]   BREAST BIOPSY Left 06/20/2022   MM LT BREAST BX W LOC DEV 1ST LESION IMAGE BX SPEC STEREO GUIDE 06/20/2022 [facility]   BREAST BIOPSY  07/15/2022   MM LT RADIOACTIVE SEED LOC MAMMO GUIDE 07/15/2022 [facility]   BREAST BIOPSY  07/15/2022   MM LT RADIOACTIVE SEED EA ADD LESION LOC MAMMO GUIDE 07/15/2022 [facility]   BREAST  LUMPECTOMY WITH RADIOACTIVE SEED AND SENTINEL LYMPH NODE BIOPSY Left 07/16/2022   Procedure: LEFT BREAST LUMPECTOMY WITH RADIOACTIVE SEED X2 AND AXILLARY SENTINEL LYMPH NODE BIOPSY;  Surgeon: Belinda Cough, MD;  Location: [facility];  Service: General;  Laterality: Left;   CATARACT EXTRACTION, BILATERAL     08/14/2022, 08/28/2022   GASTRIC BYPASS     HAND SURGERY Right    LASIK     RE-EXCISION OF BREAST LUMPECTOMY Left 07/24/2022   Procedure: RE-EXCISION OF LEFT BREAST LUMPECTOMY ANTERIOR AND LATERAL MARGINS;  Surgeon: Belinda Cough, MD;  Location: [facility];  Service: General;  Laterality: Left;  45 MIN ROOM 12    SOCIAL HISTORY: Social History   Socioeconomic History   Marital status: Married    Spouse name: Debby   Number of children: Not on file   Years of education: Not on file   Highest education level: Bachelor's degree (e.g., BA, AB, BS)  Occupational History   Not on file  Tobacco Use   Smoking status: Never    Passive exposure: Never   Smokeless tobacco: Never  Vaping Use   Vaping status: Never Used  Substance and Sexual Activity   Alcohol use: Yes    Alcohol/week: 3.0 standard drinks of alcohol    Types: 3 Glasses of wine per week    Comment: wine 3 times per week   Drug use: Never   Sexual activity: Yes    Partners: Male  Other Topics Concern  Not on file  Social History Narrative   Not on file   Social Drivers of Health   Financial Resource Strain: Low Risk  (09/27/2023)   Overall Financial Resource Strain (CARDIA)    Difficulty of Paying Living Expenses: Not hard at all  Food Insecurity: No Food Insecurity (09/27/2023)   Hunger Vital Sign    Worried About Running Out of Food in the Last Year: Never true    Ran Out of Food in the Last Year: Never true  Transportation Needs: No Transportation Needs (09/27/2023)   PRAPARE - Administrator, Civil Service (Medical): No    Lack of Transportation (Non-Medical): No  Physical Activity: Insufficiently Active  (09/27/2023)   Exercise Vital Sign    Days of Exercise per Week: 1 day    Minutes of Exercise per Session: 60 min  Stress: No Stress Concern Present (09/27/2023)   [employer] of Occupational Health - Occupational Stress Questionnaire    Feeling of Stress : Only a little  Social Connections: Moderately Integrated (09/27/2023)   Social Connection and Isolation Panel    Frequency of Communication with Friends and Family: More than three times a week    Frequency of Social Gatherings with Friends and Family: Once a week    Attends Religious Services: 1 to 4 times per year    Active Member of [employer] or Organizations: No    Attends Engineer, structural: Not on file    Marital Status: Married  Catering manager Violence: Not on file    FAMILY HISTORY: Family History  Problem Relation Age of Onset   Heart failure Mother    Breast cancer Mother 29    Review of Systems  Constitutional:  Negative for appetite change, chills, fatigue, fever and unexpected weight change.  HENT:   Negative for hearing loss, lump/mass and trouble swallowing.   Eyes:  Negative for eye problems and icterus.  Respiratory:  Negative for chest tightness, cough and shortness of breath.   Cardiovascular:  Negative for chest pain, leg swelling and palpitations.  Gastrointestinal:  Negative for abdominal distention, abdominal pain, constipation, diarrhea, nausea and vomiting.  Endocrine: Negative for hot flashes.  Genitourinary:  Negative for difficulty urinating.   Musculoskeletal:  Negative for arthralgias.  Skin:  Negative for itching and rash.  Neurological:  Negative for dizziness, extremity weakness, headaches and numbness.  Hematological:  Negative for adenopathy. Does not bruise/bleed easily.  Psychiatric/Behavioral:  Negative for depression. The patient is not nervous/anxious.       PHYSICAL EXAMINATION   Onc Performance Status - 01/09/24 1100       KPS SCALE   KPS % SCORE Able to carry on  normal activity, minor s/s of disease          Vitals:   01/09/24 1127  BP: (!) 151/84  Pulse: 83  Resp: 20  Temp: 98.7 F (37.1 C)  SpO2: 96%    Physical Exam Constitutional:      General: She is not in acute distress.    Appearance: Normal appearance. She is not toxic-appearing.  HENT:     Head: Normocephalic and atraumatic.     Mouth/Throat:     Mouth: Mucous membranes are moist.     Pharynx: Oropharynx is clear. No oropharyngeal exudate or posterior oropharyngeal erythema.  Eyes:     General: No scleral icterus. Cardiovascular:     Rate and Rhythm: Normal rate and regular rhythm.     Pulses: Normal pulses.  Heart sounds: Normal heart sounds.  Pulmonary:     Effort: Pulmonary effort is normal.     Breath sounds: Normal breath sounds.  Chest:     Comments: Right breast is benign. Left breast with ongoing lymphedema. Improved compared to last visit. No definite masses or regional adenopathy Abdominal:     General: Abdomen is flat. Bowel sounds are normal. There is no distension.     Palpations: Abdomen is soft.     Tenderness: There is no abdominal tenderness.  Musculoskeletal:        General: No swelling.     Cervical back: Neck supple.  Lymphadenopathy:     Cervical: No cervical adenopathy.  Skin:    General: Skin is warm and dry.     Findings: No rash.  Neurological:     General: No focal deficit present.     Mental Status: She is alert.  Psychiatric:        Mood and Affect: Mood normal.        Behavior: Behavior normal.     LABORATORY DATA:  CBC    Component Value Date/Time   WBC 5.2 07/22/2023 0858   RBC 4.72 07/22/2023 0858   HGB 13.0 07/22/2023 0858   HGB 12.8 06/26/2022 0802   HCT 40.2 07/22/2023 0858   PLT 338.0 07/22/2023 0858   PLT 320 06/26/2022 0802   MCV 85.1 07/22/2023 0858   MCH 28.0 06/26/2022 0802   MCHC 32.4 07/22/2023 0858   RDW 16.3 (H) 07/22/2023 0858   LYMPHSABS 2.0 06/26/2022 0802   MONOABS 0.7 06/26/2022 0802    EOSABS 0.4 06/26/2022 0802   BASOSABS 0.1 06/26/2022 0802    CMP     Component Value Date/Time   NA 140 07/22/2023 0858   K 4.4 07/22/2023 0858   CL 105 07/22/2023 0858   CO2 27 07/22/2023 0858   GLUCOSE 91 07/22/2023 0858   BUN 13 07/22/2023 0858   CREATININE 0.60 07/22/2023 0858   CREATININE 0.59 06/26/2022 0802   CREATININE 0.65 05/10/2022 1101   CALCIUM 10.3 07/22/2023 0858   PROT 7.1 09/17/2023 1436   ALBUMIN 4.0 07/22/2023 0858   AST 13 07/22/2023 0858   AST 13 (L) 06/26/2022 0802   ALT 6 07/22/2023 0858   ALT 10 06/26/2022 0802   ALKPHOS 87 07/22/2023 0858   BILITOT 0.5 07/22/2023 0858   BILITOT 0.3 06/26/2022 0802   GFRNONAA >60 06/26/2022 0802         ASSESSMENT and THERAPY PLAN:   Aimy is a 68 year old woman with history of stage Ia ER/PR positive breast cancer diagnosed in February 2024.  She is status postlumpectomy followed by adjuvant radiation therapy and began on anastrozole  in July 2024.   Assessment and Plan Assessment & Plan Breast cancer Managed with anastrozole  since July 2024. No new side effects. Previous symptoms resolved. Normal mammogram in January. - Continue anastrozole  therapy. - Refill anastrozole  prescription at [facility]. - Scheduled next mammogram for January. - Instruct to perform monthly breast self-examinations and report any changes.  Lymphedema of breast region Present but not bothersome. Improved with previous physical therapy. - Encourage continued physical activity. - Advise wearing compression bras to manage symptoms.  Osteopenia - Schedule next bone density assessment for later next year. Continue weight bearing exercises, Vit D and calcium supplementation   All questions were answered. The patient knows to call the clinic with any problems, questions or concerns. We can certainly see the patient  much sooner if necessary.  Total encounter time:30 minutes*in face-to-face  visit time, chart review, lab review, care coordination, order entry, and documentation of the encounter time.   *Total Encounter Time as defined by the Centers for Medicare and Medicaid Services includes, in addition to the face-to-face time of a patient visit (documented in the note above) non-face-to-face time: obtaining and reviewing outside history, ordering and reviewing medications, tests or procedures, care coordination (communications with other health care professionals or caregivers) and documentation in the medical record.

## 2024-01-23 ENCOUNTER — Other Ambulatory Visit: Payer: Self-pay

## 2024-01-23 ENCOUNTER — Other Ambulatory Visit (HOSPITAL_BASED_OUTPATIENT_CLINIC_OR_DEPARTMENT_OTHER): Payer: Self-pay

## 2024-02-18 ENCOUNTER — Ambulatory Visit: Attending: Rheumatology | Admitting: Rheumatology

## 2024-02-18 ENCOUNTER — Encounter: Payer: Self-pay | Admitting: Rheumatology

## 2024-02-18 ENCOUNTER — Other Ambulatory Visit (HOSPITAL_BASED_OUTPATIENT_CLINIC_OR_DEPARTMENT_OTHER): Payer: Self-pay

## 2024-02-18 VITALS — BP 110/70 | HR 99 | Temp 98.1°F | Resp 14 | Ht 60.5 in | Wt 159.0 lb

## 2024-02-18 DIAGNOSIS — E559 Vitamin D deficiency, unspecified: Secondary | ICD-10-CM

## 2024-02-18 DIAGNOSIS — Z79899 Other long term (current) drug therapy: Secondary | ICD-10-CM | POA: Diagnosis not present

## 2024-02-18 DIAGNOSIS — M79672 Pain in left foot: Secondary | ICD-10-CM

## 2024-02-18 DIAGNOSIS — F325 Major depressive disorder, single episode, in full remission: Secondary | ICD-10-CM

## 2024-02-18 DIAGNOSIS — M4004 Postural kyphosis, thoracic region: Secondary | ICD-10-CM

## 2024-02-18 DIAGNOSIS — F5104 Psychophysiologic insomnia: Secondary | ICD-10-CM

## 2024-02-18 DIAGNOSIS — C50412 Malignant neoplasm of upper-outer quadrant of left female breast: Secondary | ICD-10-CM

## 2024-02-18 DIAGNOSIS — M79671 Pain in right foot: Secondary | ICD-10-CM

## 2024-02-18 DIAGNOSIS — M170 Bilateral primary osteoarthritis of knee: Secondary | ICD-10-CM | POA: Diagnosis not present

## 2024-02-18 DIAGNOSIS — R197 Diarrhea, unspecified: Secondary | ICD-10-CM

## 2024-02-18 DIAGNOSIS — M19042 Primary osteoarthritis, left hand: Secondary | ICD-10-CM

## 2024-02-18 DIAGNOSIS — I10 Essential (primary) hypertension: Secondary | ICD-10-CM | POA: Diagnosis not present

## 2024-02-18 DIAGNOSIS — M19041 Primary osteoarthritis, right hand: Secondary | ICD-10-CM

## 2024-02-18 DIAGNOSIS — Z170 Estrogen receptor positive status [ER+]: Secondary | ICD-10-CM

## 2024-02-18 DIAGNOSIS — M069 Rheumatoid arthritis, unspecified: Secondary | ICD-10-CM

## 2024-02-18 DIAGNOSIS — M8589 Other specified disorders of bone density and structure, multiple sites: Secondary | ICD-10-CM

## 2024-02-18 DIAGNOSIS — R5383 Other fatigue: Secondary | ICD-10-CM

## 2024-02-18 DIAGNOSIS — K909 Intestinal malabsorption, unspecified: Secondary | ICD-10-CM

## 2024-02-18 LAB — CBC WITH DIFFERENTIAL/PLATELET
Absolute Lymphocytes: 1565 {cells}/uL (ref 850–3900)
Absolute Monocytes: 494 {cells}/uL (ref 200–950)
Basophils Absolute: 38 {cells}/uL (ref 0–200)
Basophils Relative: 0.8 %
Eosinophils Absolute: 149 {cells}/uL (ref 15–500)
Eosinophils Relative: 3.1 %
HCT: 41.2 % (ref 35.0–45.0)
Hemoglobin: 13.3 g/dL (ref 11.7–15.5)
MCH: 26.8 pg — ABNORMAL LOW (ref 27.0–33.0)
MCHC: 32.3 g/dL (ref 32.0–36.0)
MCV: 83.1 fL (ref 80.0–100.0)
MPV: 8.8 fL (ref 7.5–12.5)
Monocytes Relative: 10.3 %
Neutro Abs: 2554 {cells}/uL (ref 1500–7800)
Neutrophils Relative %: 53.2 %
Platelets: 335 Thousand/uL (ref 140–400)
RBC: 4.96 Million/uL (ref 3.80–5.10)
RDW: 15.6 % — ABNORMAL HIGH (ref 11.0–15.0)
Total Lymphocyte: 32.6 %
WBC: 4.8 Thousand/uL (ref 3.8–10.8)

## 2024-02-18 LAB — COMPREHENSIVE METABOLIC PANEL WITH GFR
AG Ratio: 1.8 (calc) (ref 1.0–2.5)
ALT: 16 U/L (ref 6–29)
AST: 23 U/L (ref 10–35)
Albumin: 4.3 g/dL (ref 3.6–5.1)
Alkaline phosphatase (APISO): 73 U/L (ref 37–153)
BUN: 9 mg/dL (ref 7–25)
CO2: 23 mmol/L (ref 20–32)
Calcium: 10.1 mg/dL (ref 8.6–10.4)
Chloride: 109 mmol/L (ref 98–110)
Creat: 0.61 mg/dL (ref 0.60–1.00)
Globulin: 2.4 g/dL (ref 1.9–3.7)
Glucose, Bld: 104 mg/dL — ABNORMAL HIGH (ref 65–99)
Potassium: 4.4 mmol/L (ref 3.5–5.3)
Sodium: 139 mmol/L (ref 135–146)
Total Bilirubin: 0.3 mg/dL (ref 0.2–1.2)
Total Protein: 6.7 g/dL (ref 6.1–8.1)
eGFR: 96 mL/min/1.73m2 (ref >=60)

## 2024-02-18 MED ORDER — HYDROXYCHLOROQUINE SULFATE 200 MG PO TABS
200.0000 mg | ORAL_TABLET | Freq: Every day | ORAL | 0 refills | Status: AC
  Filled 2024-02-18: qty 90, 90d supply, fill #0

## 2024-02-18 NOTE — Progress Notes (Signed)
 Office Visit Note  Patient: Linda Wood             Date of Birth: 1955-08-21           MRN: [id number]             PCP: Frann Mabel Mt, DO Referring: Frann Mabel Mt* Visit Date: 02/18/2024 Occupation: Data Unavailable  Subjective:  medication management  History of Present Illness: Linda Wood is a 68 y.o. female with seropositive rheumatoid arthritis, osteoarthritis and osteopenia.  She returns today after her last visit in May 2025.  She continues to have pain and discomfort in her hands.  She states she does not feel comfortable doing arts and crafts due to hand stiffness.  She has not noticed any joint swelling.  She has been taking hydroxychloroquine  200 mg daily without any interruption.  She denies any discomfort in her knees or feet.  She takes calcium and vitamin D  intermittently.  She has not been exercising on a regular basis.    Activities of Daily Living:  Patient reports morning stiffness for 1 hour.   Patient Denies nocturnal pain.  Difficulty dressing/grooming: Denies Difficulty climbing stairs: Denies Difficulty getting out of chair: Denies Difficulty using hands for taps, buttons, cutlery, and/or writing: Denies  Review of Systems  Constitutional:  Negative for fatigue.  HENT:  Negative for mouth sores and mouth dryness.   Eyes:  Negative for dryness.  Respiratory:  Negative for shortness of breath.   Cardiovascular:  Negative for chest pain and palpitations.  Gastrointestinal:  Negative for blood in stool, constipation and diarrhea.  Endocrine: Negative for increased urination.  Genitourinary:  Negative for involuntary urination.  Musculoskeletal:  Positive for joint pain, joint pain, joint swelling, muscle weakness and morning stiffness. Negative for gait problem, myalgias, muscle tenderness and myalgias.  Skin:  Positive for hair loss. Negative for color change, rash and sensitivity to sunlight.  Allergic/Immunologic: Negative for  susceptible to infections.  Neurological:  Negative for dizziness and headaches.  Hematological:  Negative for swollen glands.  Psychiatric/Behavioral:  Negative for depressed mood and sleep disturbance. The patient is not nervous/anxious.     PMFS History:  Patient Active Problem List   Diagnosis Date Noted   Genetic testing 07/05/2022   Family history of breast cancer 06/27/2022   Malignant neoplasm of upper-outer quadrant of left breast in female, estrogen receptor positive (HCC) 06/25/2022   Psychophysiological insomnia 11/28/2021   Rheumatoid arthritis (HCC) 10/29/2021   Essential hypertension 10/29/2021   Depression, major, single episode, complete remission 10/29/2021   Rib fractures 10/15/2021    Past Medical History:  Diagnosis Date   Anxiety    Breast cancer (HCC)    Depression    Hypertension    Rheumatoid arthritis (HCC)     Family History  Problem Relation Age of Onset   Heart failure Mother    Breast cancer Mother 35   Past Surgical History:  Procedure Laterality Date   BREAST BIOPSY Left 06/20/2022   US  LT BREAST BX W LOC DEV 1ST LESION IMG BX SPEC US  GUIDE 06/20/2022 [facility]   BREAST BIOPSY Left 06/20/2022   MM LT BREAST BX W LOC DEV 1ST LESION IMAGE BX SPEC STEREO GUIDE 06/20/2022 [facility]   BREAST BIOPSY  07/15/2022   MM LT RADIOACTIVE SEED LOC MAMMO GUIDE 07/15/2022 [facility]   BREAST BIOPSY  07/15/2022   MM LT RADIOACTIVE SEED EA ADD LESION LOC MAMMO GUIDE 07/15/2022 [facility]   BREAST LUMPECTOMY  WITH RADIOACTIVE SEED AND SENTINEL LYMPH NODE BIOPSY Left 07/16/2022   Procedure: LEFT BREAST LUMPECTOMY WITH RADIOACTIVE SEED X2 AND AXILLARY SENTINEL LYMPH NODE BIOPSY;  Surgeon: Belinda Cough, MD;  Location: [facility] OR;  Service: General;  Laterality: Left;   CATARACT EXTRACTION, BILATERAL     08/14/2022, 08/28/2022   GASTRIC BYPASS     HAND SURGERY Right    LASIK     RE-EXCISION OF BREAST LUMPECTOMY Left 07/24/2022    Procedure: RE-EXCISION OF LEFT BREAST LUMPECTOMY ANTERIOR AND LATERAL MARGINS;  Surgeon: Belinda Cough, MD;  Location: [facility] OR;  Service: General;  Laterality: Left;  45 MIN ROOM 12   Social History   Tobacco Use   Smoking status: Never    Passive exposure: Never   Smokeless tobacco: Never  Vaping Use   Vaping status: Never Used  Substance Use Topics   Alcohol use: Yes    Alcohol/week: 3.0 standard drinks of alcohol    Types: 3 Glasses of wine per week    Comment: wine 3 times per week   Drug use: Never   Social History   Social History Narrative   Not on file     Immunization History  Administered Date(s) Administered   Fluad Quad(high Dose 65+) 05/02/2020, 05/03/2022   Influenza, Quadrivalent, Recombinant, Inj, Pf 05/21/2017   Influenza,inj,Quad PF,6+ Mos 03/17/2015   Moderna Sars-Covid-2 Vaccination 05/02/2020   PFIZER(Purple Top)SARS-COV-2 Vaccination 07/11/2019, 08/01/2019   PNEUMOCOCCAL CONJUGATE-20 10/04/2020   Tdap 11/03/2021   Zoster Recombinant(Shingrix) 10/06/2017, 12/07/2019, 02/25/2020     Objective: Vital Signs: BP 110/70   Pulse 99   Temp 98.1 F (36.7 C)   Resp 14   Ht 5' 0.5 (1.537 m)   Wt 159 lb (72.1 kg)   BMI 30.54 kg/m    Physical Exam Vitals and nursing note reviewed.  Constitutional:      Appearance: She is well-developed.  HENT:     Head: Normocephalic and atraumatic.  Eyes:     Conjunctiva/sclera: Conjunctivae normal.  Cardiovascular:     Rate and Rhythm: Normal rate and regular rhythm.     Heart sounds: Normal heart sounds.  Pulmonary:     Effort: Pulmonary effort is normal.     Breath sounds: Normal breath sounds.  Abdominal:     General: Bowel sounds are normal.     Palpations: Abdomen is soft.  Musculoskeletal:     Cervical back: Normal range of motion.  Lymphadenopathy:     Cervical: No cervical adenopathy.  Skin:    General: Skin is warm and dry.     Capillary Refill: Capillary refill takes less than 2 seconds.   Neurological:     Mental Status: She is alert and oriented to person, place, and time.  Psychiatric:        Behavior: Behavior normal.      Musculoskeletal Exam: She had limited lateral rotation of the cervical spine.  She had good mobility in the thoracic and lumbar spine with thoracic kyphosis.  Shoulders, elbows, wrist, MCPs PIPs and DIPs were in good range of motion.  No synovitis was noted.  Hip joints and knee joints with good range of motion without any warmth swelling or effusion.  There was no tenderness over ankles or MTPs.  CDAI Exam: CDAI Score: -- Patient Global: 0 / 100; Provider Global: 0 / 100 Swollen: --; Tender: -- Joint Exam 02/18/2024   No joint exam has been documented for this visit   There is currently no information documented on  the homunculus. Go to the Rheumatology activity and complete the homunculus joint exam.  Investigation: No additional findings.  Imaging: No results found.  Recent Labs: Lab Results  Component Value Date   WBC 5.2 07/22/2023   HGB 13.0 07/22/2023   PLT 338.0 07/22/2023   NA 140 07/22/2023   K 4.4 07/22/2023   CL 105 07/22/2023   CO2 27 07/22/2023   GLUCOSE 91 07/22/2023   BUN 13 07/22/2023   CREATININE 0.60 07/22/2023   BILITOT 0.5 07/22/2023   ALKPHOS 87 07/22/2023   AST 13 07/22/2023   ALT 6 07/22/2023   PROT 7.1 09/17/2023   ALBUMIN 4.0 07/22/2023   CALCIUM 10.3 07/22/2023    Speciality Comments: PLQ Eye Exam: 05/01/2023 WNL Follow up in 6 months Hydroxychloroquine  200 mg p.o. daily since 2011.    Procedures:  No procedures performed Allergies: Dextromethorphan, Dextromethorphan-guaifenesin, and Promethazine   Assessment / Plan:     Visit Diagnoses: Rheumatoid arthritis involving multiple sites, unspecified whether rheumatoid factor present (HCC) - +RF, +anti-CCP. Dxd in [place] 2000.Ttd with PLQ 200 mg po qd since 2011. -Patient had no synovitis on the examination.  She states she continues to have some  discomfort in her hands when she does activities.  She has been taking hydroxychloroquine  200 mg daily without any interruption.  Prescription refill was sent per patient's request.  Plan: hydroxychloroquine  (PLAQUENIL ) 200 MG tablet  High risk medication use - Hydroxychloroquine  200 mg p.o. daily since 2011.  PLQ Eye Exam: 05/01/2023.  July 22, 2023 CBC and CMP were normal.  Will recheck labs today.  Information reimmunization was placed in the AVS.  She was advised to get annual eye examination to monitor for ocular toxicity.  Primary osteoarthritis of both hands -she complains of stiffness in her hands.  No synovitis was noted on the examination today.  X-rays obtained in the past were consistent with osteoarthritis.  Primary osteoarthritis of both knees-she denies any discomfort in her knee joints today.  Pain in both feet -there was no tenderness on the examination today.  X-rays obtained in the past showed osteoarthritic changes  Postural kyphosis of thoracic region  Osteopenia of multiple sites - November 05, 2022 T score -2.2.  She has been taking calcium and vitamin D  intermittently.  Vitamin D  was 30 on Sep 17, 2023.  She was advised to take vitamin D  on a regular basis.  Essential hypertension-blood pressure was normal at 1 and 10/70.  Other medical problems listed as follows:  Vitamin D  deficiency  Other fatigue  Diarrhea due to malabsorption  Psychophysiological insomnia  Depression, major, single episode, complete remission - she is on Lexapro .  She has situational depression.  Malignant neoplasm of upper-outer quadrant of left breast in female, estrogen receptor positive (HCC)  Orders: Orders Placed This Encounter  Procedures   CBC with Differential/Platelet   Comprehensive metabolic panel with GFR   Meds ordered this encounter  Medications   hydroxychloroquine  (PLAQUENIL ) 200 MG tablet    Sig: Take 1 tablet (200 mg total) by mouth daily.    Dispense:  90 tablet     Refill:  0     Follow-Up Instructions: Return in about 5 months (around 07/18/2024) for Rheumatoid arthritis, Osteoarthritis.   Maya Nash, MD  Note - This record has been created using Animal nutritionist.  Chart creation errors have been sought, but may not always  have been located. Such creation errors do not reflect on  the standard of medical care.

## 2024-02-18 NOTE — Patient Instructions (Signed)
 Vaccines You are taking a medication(s) that can suppress your immune system.  The following immunizations are recommended: Flu annually( high dose) Covid-19  RSV Td/Tdap (tetanus, diphtheria, pertussis) every 10 years Pneumonia (Prevnar 15 then Pneumovax 23 at least 1 year apart.  Alternatively, can take Prevnar 20 without needing additional dose) Shingrix: 2 doses from 4 weeks to 6 months apart  Please check with your PCP to make sure you are up to date.

## 2024-02-19 ENCOUNTER — Ambulatory Visit: Payer: Self-pay | Admitting: Rheumatology

## 2024-02-23 ENCOUNTER — Ambulatory Visit: Attending: Adult Health

## 2024-02-23 VITALS — Wt 158.0 lb

## 2024-02-23 DIAGNOSIS — Z483 Aftercare following surgery for neoplasm: Secondary | ICD-10-CM | POA: Insufficient documentation

## 2024-02-23 NOTE — Therapy (Signed)
 OUTPATIENT PHYSICAL THERAPY SOZO SCREENING NOTE   Patient Name: Madai Nuccio MRN: [id number] DOB:06/27/1955, 68 y.o., female Today's Date: 02/23/2024  PCP: Frann Mabel Mt, DO REFERRING PROVIDER: Crawford Jacobsen Cornett*   PT End of Session - 02/23/24 1526     Visit Number 6   # unchanged due to screen only   PT Start Time 1524    PT Stop Time 1528    PT Time Calculation (min) 4 min    Activity Tolerance Patient tolerated treatment well    Behavior During Therapy WFL for tasks assessed/performed          Past Medical History:  Diagnosis Date   Anxiety    Breast cancer (HCC)    Depression    Hypertension    Rheumatoid arthritis (HCC)    Past Surgical History:  Procedure Laterality Date   BREAST BIOPSY Left 06/20/2022   US  LT BREAST BX W LOC DEV 1ST LESION IMG BX SPEC US  GUIDE 06/20/2022 [facility]   BREAST BIOPSY Left 06/20/2022   MM LT BREAST BX W LOC DEV 1ST LESION IMAGE BX SPEC STEREO GUIDE 06/20/2022 [facility]   BREAST BIOPSY  07/15/2022   MM LT RADIOACTIVE SEED LOC MAMMO GUIDE 07/15/2022 [facility]   BREAST BIOPSY  07/15/2022   MM LT RADIOACTIVE SEED EA ADD LESION LOC MAMMO GUIDE 07/15/2022 [facility]   BREAST LUMPECTOMY WITH RADIOACTIVE SEED AND SENTINEL LYMPH NODE BIOPSY Left 07/16/2022   Procedure: LEFT BREAST LUMPECTOMY WITH RADIOACTIVE SEED X2 AND AXILLARY SENTINEL LYMPH NODE BIOPSY;  Surgeon: Belinda Cough, MD;  Location: [facility];  Service: General;  Laterality: Left;   CATARACT EXTRACTION, BILATERAL     08/14/2022, 08/28/2022   GASTRIC BYPASS     HAND SURGERY Right    LASIK     RE-EXCISION OF BREAST LUMPECTOMY Left 07/24/2022   Procedure: RE-EXCISION OF LEFT BREAST LUMPECTOMY ANTERIOR AND LATERAL MARGINS;  Surgeon: Belinda Cough, MD;  Location: [facility];  Service: General;  Laterality: Left;  45 MIN ROOM 12   Patient Active Problem List   Diagnosis Date Noted   Genetic testing 07/05/2022   Family history of breast  cancer 06/27/2022   Malignant neoplasm of upper-outer quadrant of left breast in female, estrogen receptor positive (HCC) 06/25/2022   Psychophysiological insomnia 11/28/2021   Rheumatoid arthritis (HCC) 10/29/2021   Essential hypertension 10/29/2021   Depression, major, single episode, complete remission 10/29/2021   Rib fractures 10/15/2021    REFERRING DIAG: left breast cancer at risk for lymphedema  THERAPY DIAG: Aftercare following surgery for neoplasm  PERTINENT HISTORY:  Patient was diagnosed on 06/20/2022 with left grade 2 invasive ductal carcinoma breast cancer. It measures 7 mm and is located in the upper outer quadrant. It is ER/PR positive and HER2 negative with a Ki67 of 10% . She had a double  left lumpectomy with SLNB on 07/16/2022, followed by a re-excision on 07/24/2022 to get clear margins. She had radiation from 09/09/2022-10/07/2022 and is currently taking Anastrozole .   PRECAUTIONS: left UE Lymphedema risk, None  SUBJECTIVE: Pt returns for her 3 month L-Dex screen.   PAIN:  Are you having pain? No  SOZO SCREENING: Patient was assessed today using the SOZO machine to determine the lymphedema index score. This was compared to her baseline score. It was determined that she is within the recommended range when compared to her baseline and no further action is needed at this time. She will continue SOZO screenings. These are done every 3 months for  2 years post operatively followed by every 6 months for 2 years, and then annually.   L-DEX FLOWSHEETS - 02/23/24 1500       L-DEX LYMPHEDEMA SCREENING   Measurement Type Unilateral    L-DEX MEASUREMENT EXTREMITY Upper Extremity    POSITION  Standing    DOMINANT SIDE Right    At Risk Side Left    BASELINE SCORE (UNILATERAL) 8.2    L-DEX SCORE (UNILATERAL) 4.6    VALUE CHANGE (UNILAT) -3.6          P: Cont 3 month L-Dex screens until 06/2024 then transition to 6 month L-Dex screens until 06/2026.  Aden Berwyn Caldron, PTA 02/23/2024, 3:27 PM

## 2024-03-06 IMAGING — CT CT CHEST W/O CM
2 of 3 series · 15 of 36 positions shown, 18 images · non-contrast
Comparison: 10/15/2021

CLINICAL DATA: Rib fracture suspected.  Patient fell today.



[Series 2: thorax · axial · 0.68mm/px · z∈[-292,-38]mm · 12 of 151 slices shown, 15 images]
[im 12/151  mediastinal]
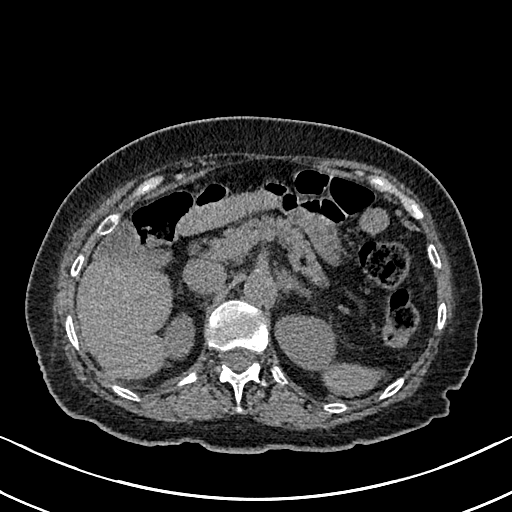
[im 12/151  lung]
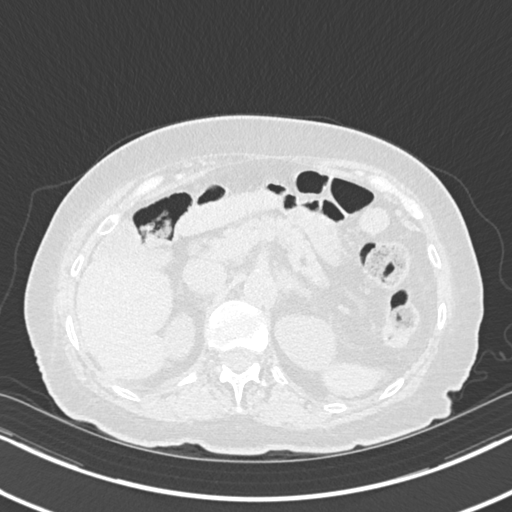
[im 23/151  lung]
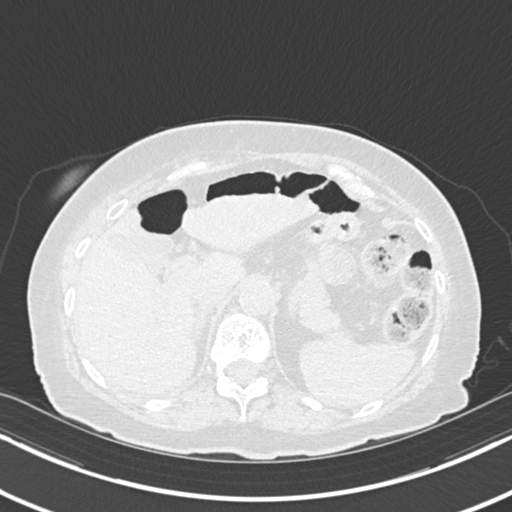
[im 34/151  lung]
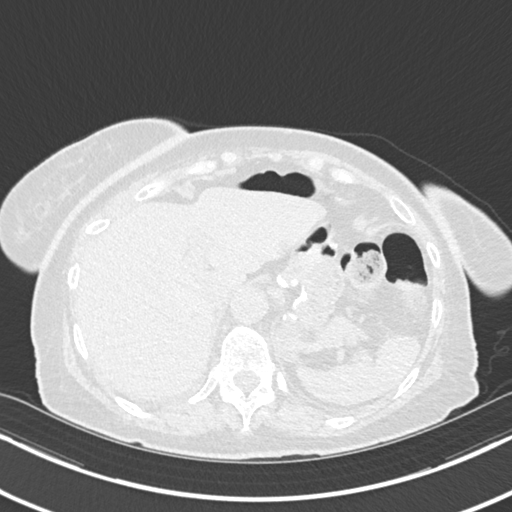
[im 45/151  lung]
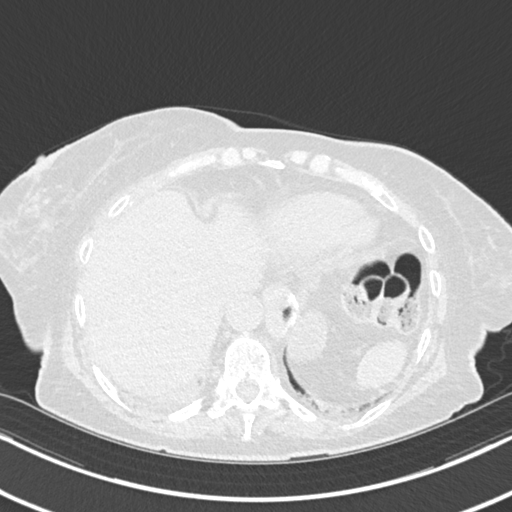
[im 56/151  mediastinal]
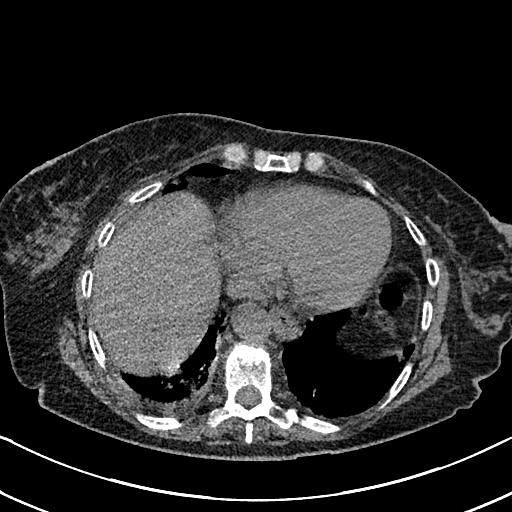
[im 56/151  lung]
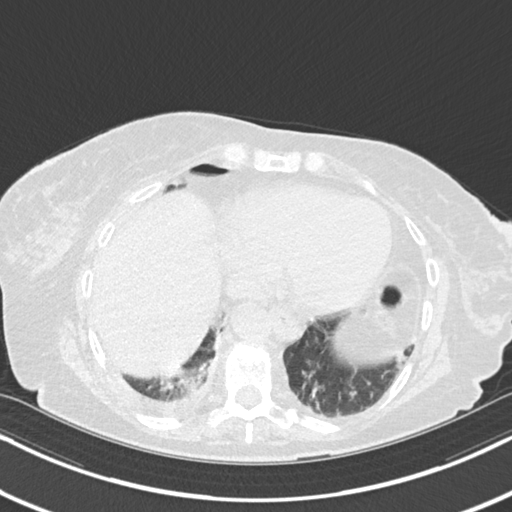
[im 67/151  lung]
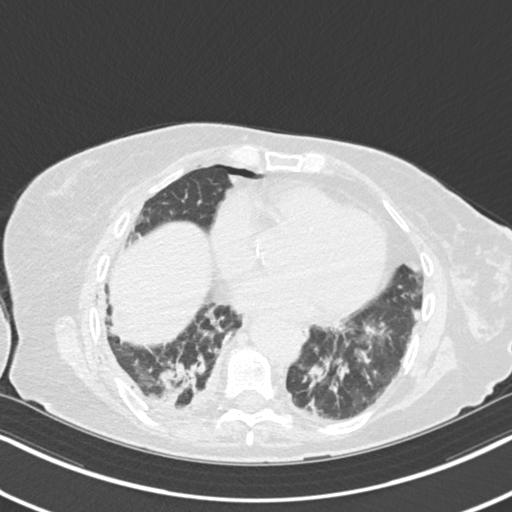
[im 84/151  lung]
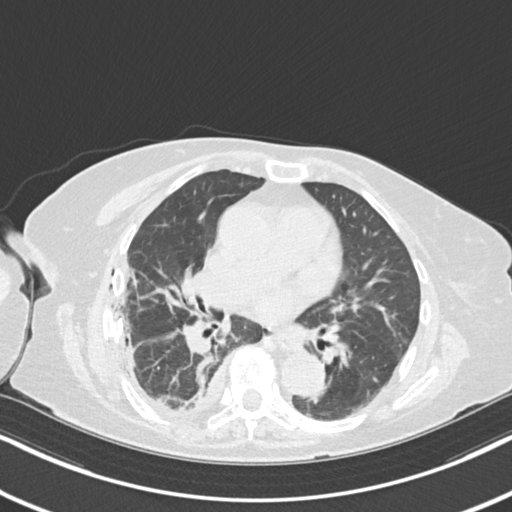
[im 95/151  lung]
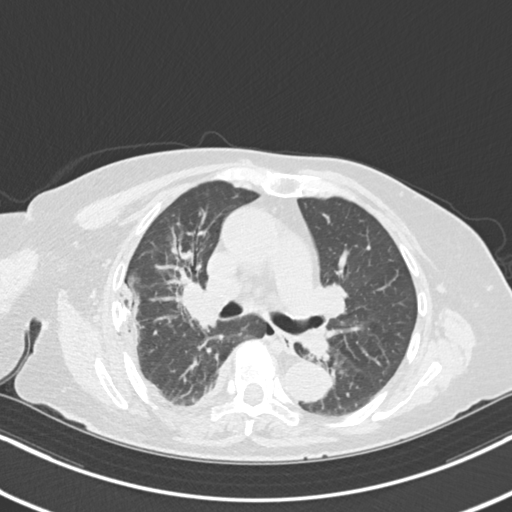
[im 106/151  mediastinal]
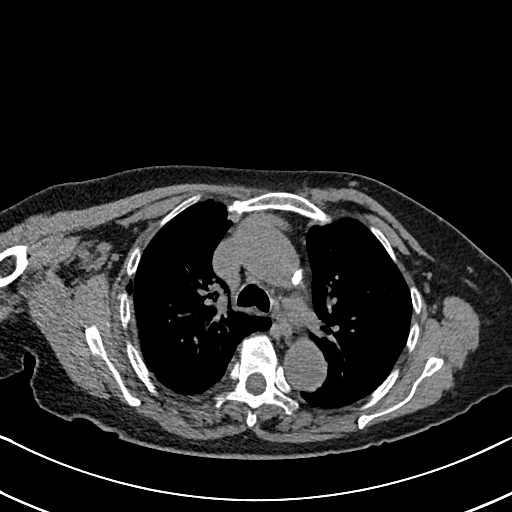
[im 106/151  lung]
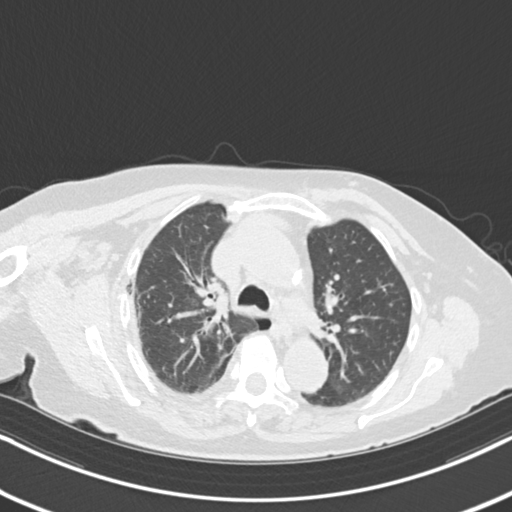
[im 117/151  lung]
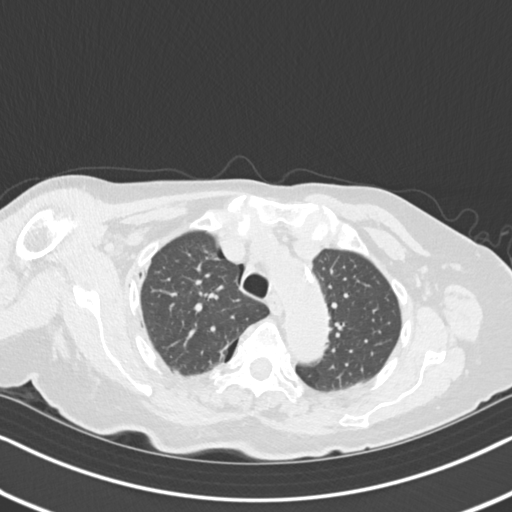
[im 128/151  lung]
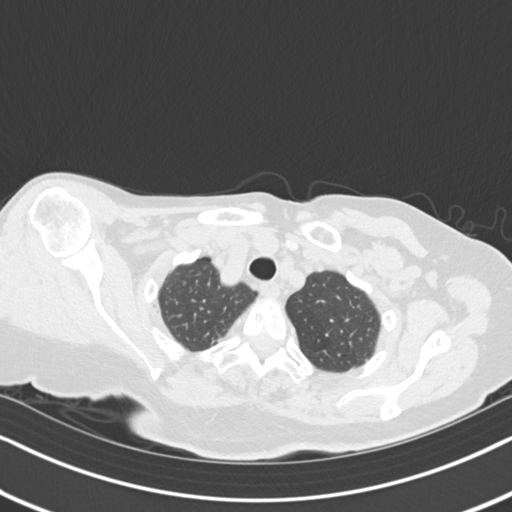
[im 139/151  lung]
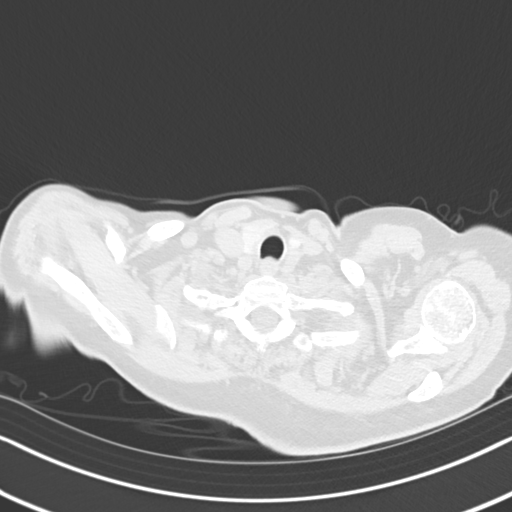

[Series 5: coronal · coronal · 0.59mm/px · 3 of 122 slices shown]
[im 25/122  lung]
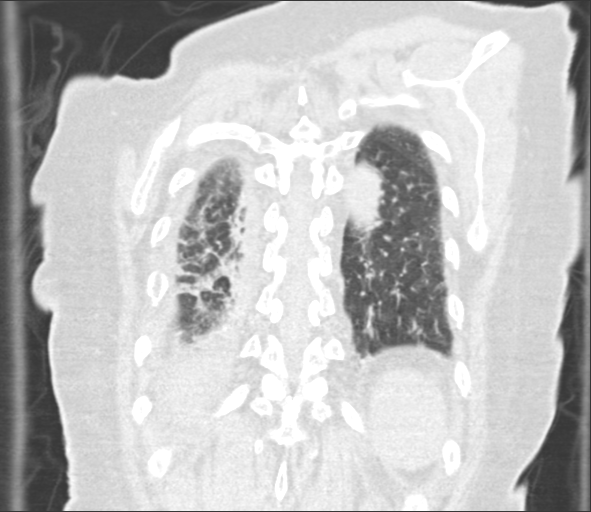
[im 49/122  lung]
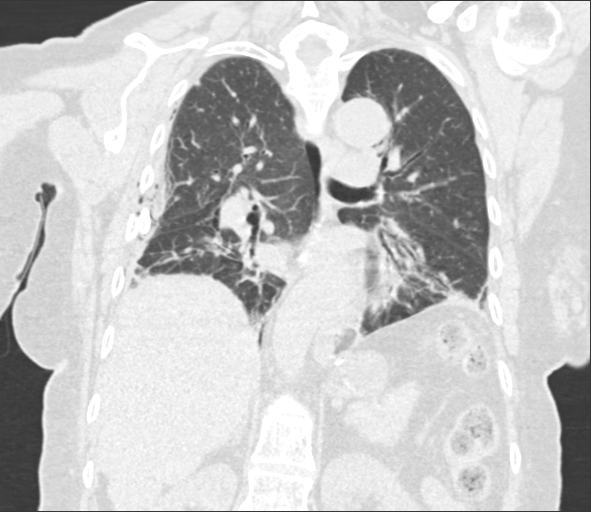
[im 73/122  lung]
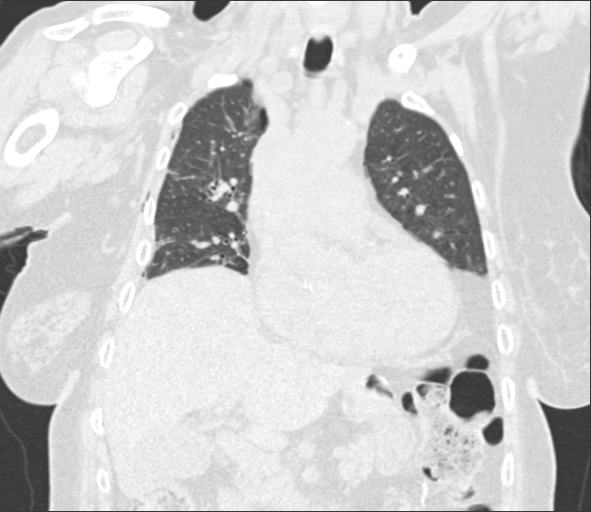

[15 of 36 positions shown; findings below may reference images not displayed]

FINDINGS: Cardiovascular: Heart size is normal. There is atherosclerotic
calcification of the thoracic aorta, not associated with aneurysm.
Coronary artery calcifications are present. Noncontrast appearance
of the pulmonary artery is normal.

Mediastinum/Nodes: Small hiatal hernia is present. The visualized
portion of the thyroid gland has a normal appearance. No significant
mediastinal, hilar, or axillary adenopathy.

Lungs/Pleura: Trace pneumothorax on the RIGHT. There are infiltrates
in the RIGHT LOWER lobe and RIGHT UPPER lobe, in a
peribronchovascular distribution. Focal areas of bronchiectasis in
the LOWER lobes bilaterally. Predominantly in the LOWER lobes. There
is perihilar peribronchial thickening. 1

Upper Abdomen: The gallbladder is present. Postoperative changes in
the gastroesophageal region. No acute abnormality.

Musculoskeletal: There are acute fractures of RIGHT ribs 3 through
8. Many of these fractures are segmental. With significant chest
wall deformity, associated pleural thickening, and trace
pneumothorax. LEFT ribs are unremarkable. Sternum is intact. There
is a superior endplate fracture of T2, of indeterminate age. No
retropulsion of fracture fragments. Clavicles are intact.
IMPRESSION: 1. Small RIGHT pneumothorax associated with acute fractures of the
RIGHT LATERAL ribs 3 through 8. Many of these rib fractures are
segmental.
2. Superior endplate fracture of T2, of indeterminate age not
associated retropulsion.
3. Pulmonary infiltrates in the RIGHT UPPER lobe and RIGHT LOWER
lobe, consistent with infectious process or contusion. Infectious
process is favored.
4. Small RIGHT pleural effusion.
5.  Aortic atherosclerosis.  ([id number])
6. Coronary artery calcifications.
7. Small hiatal hernia.
8. Postoperative changes in the gastroesophageal junction.

## 2024-03-06 IMAGING — CR DG RIBS W/ CHEST 3+V*R*
3 series · 3 of 3 positions shown · non-contrast
Comparison: None Available.

CLINICAL DATA: Right rib pain

EXAM:
RIGHT RIBS AND CHEST - 3+ VIEW

[w chest pa]
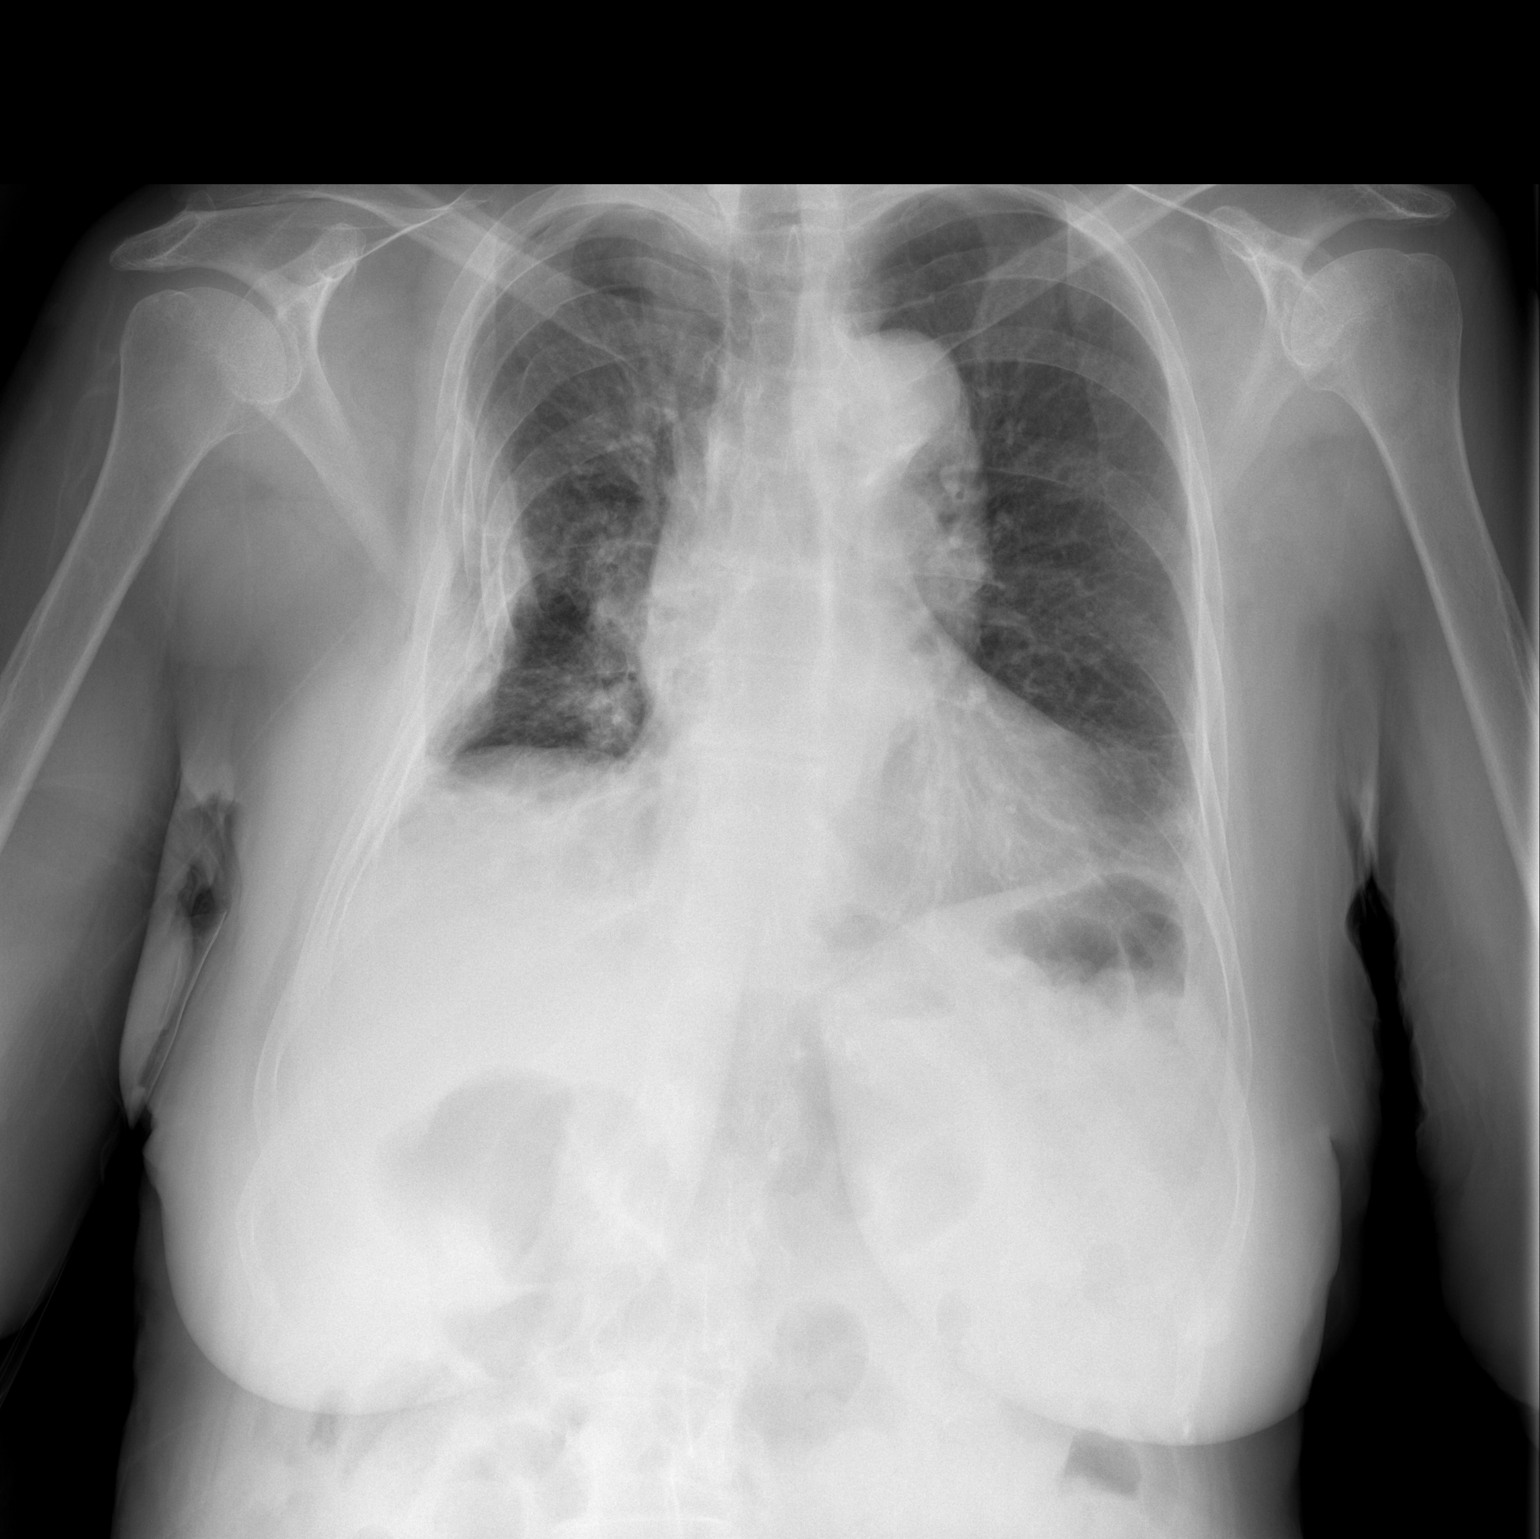

[w ribs ap/pa lower right]
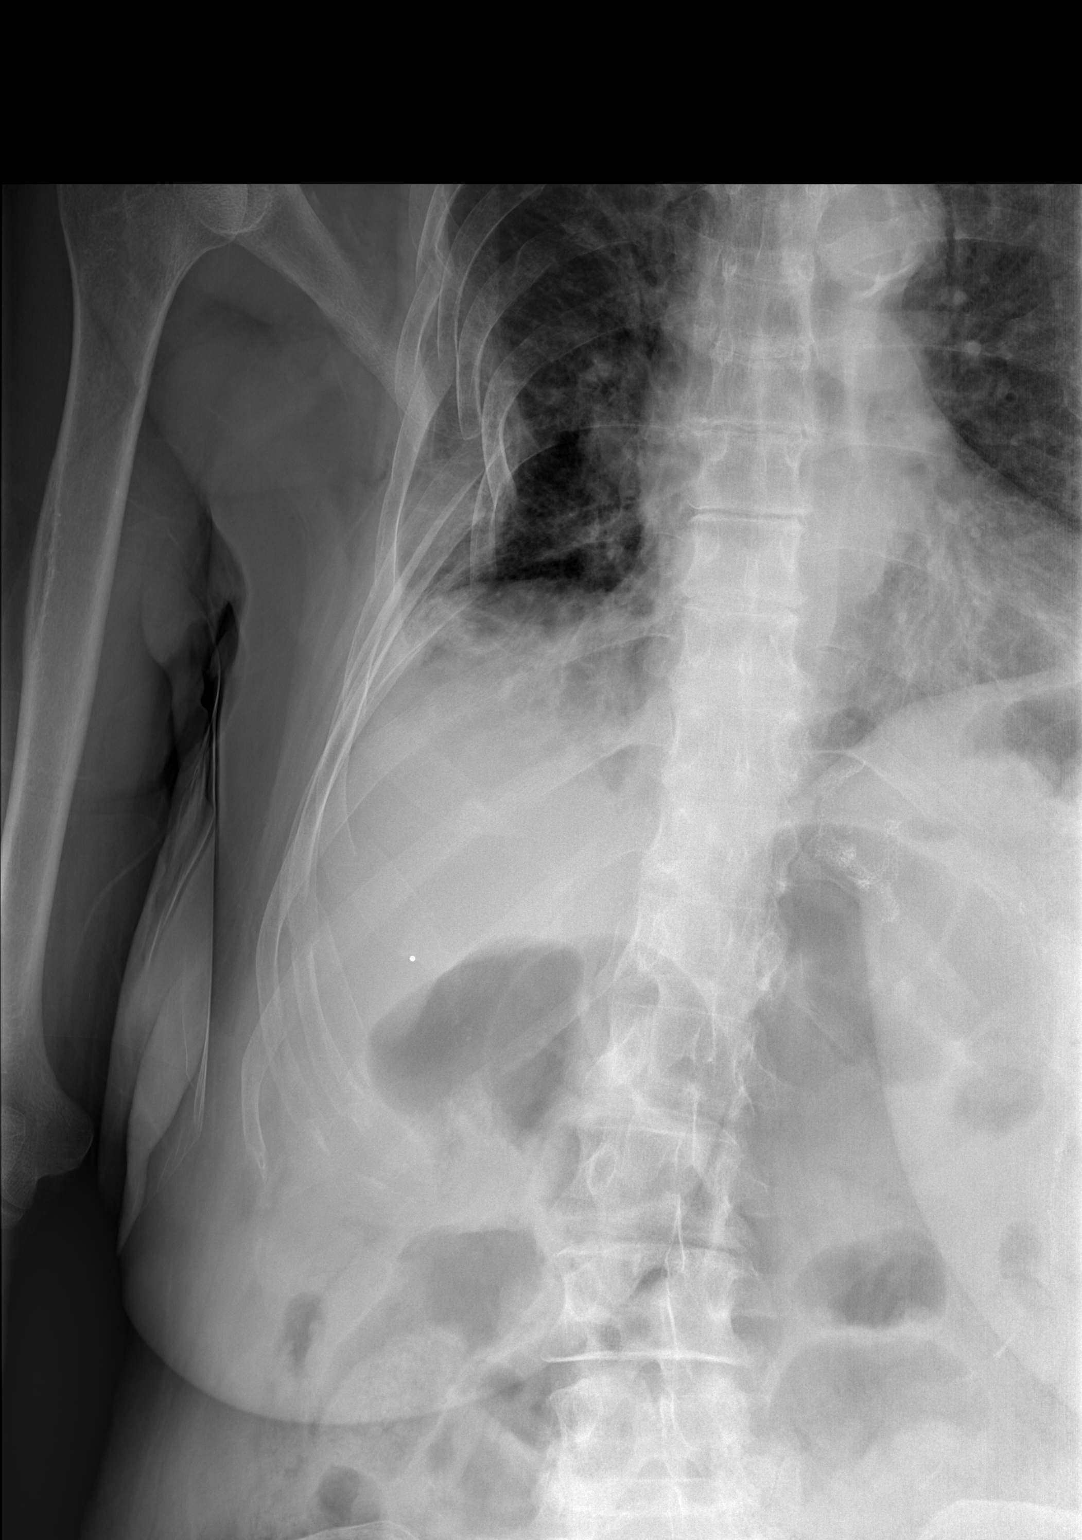

[w ribs oblique right]
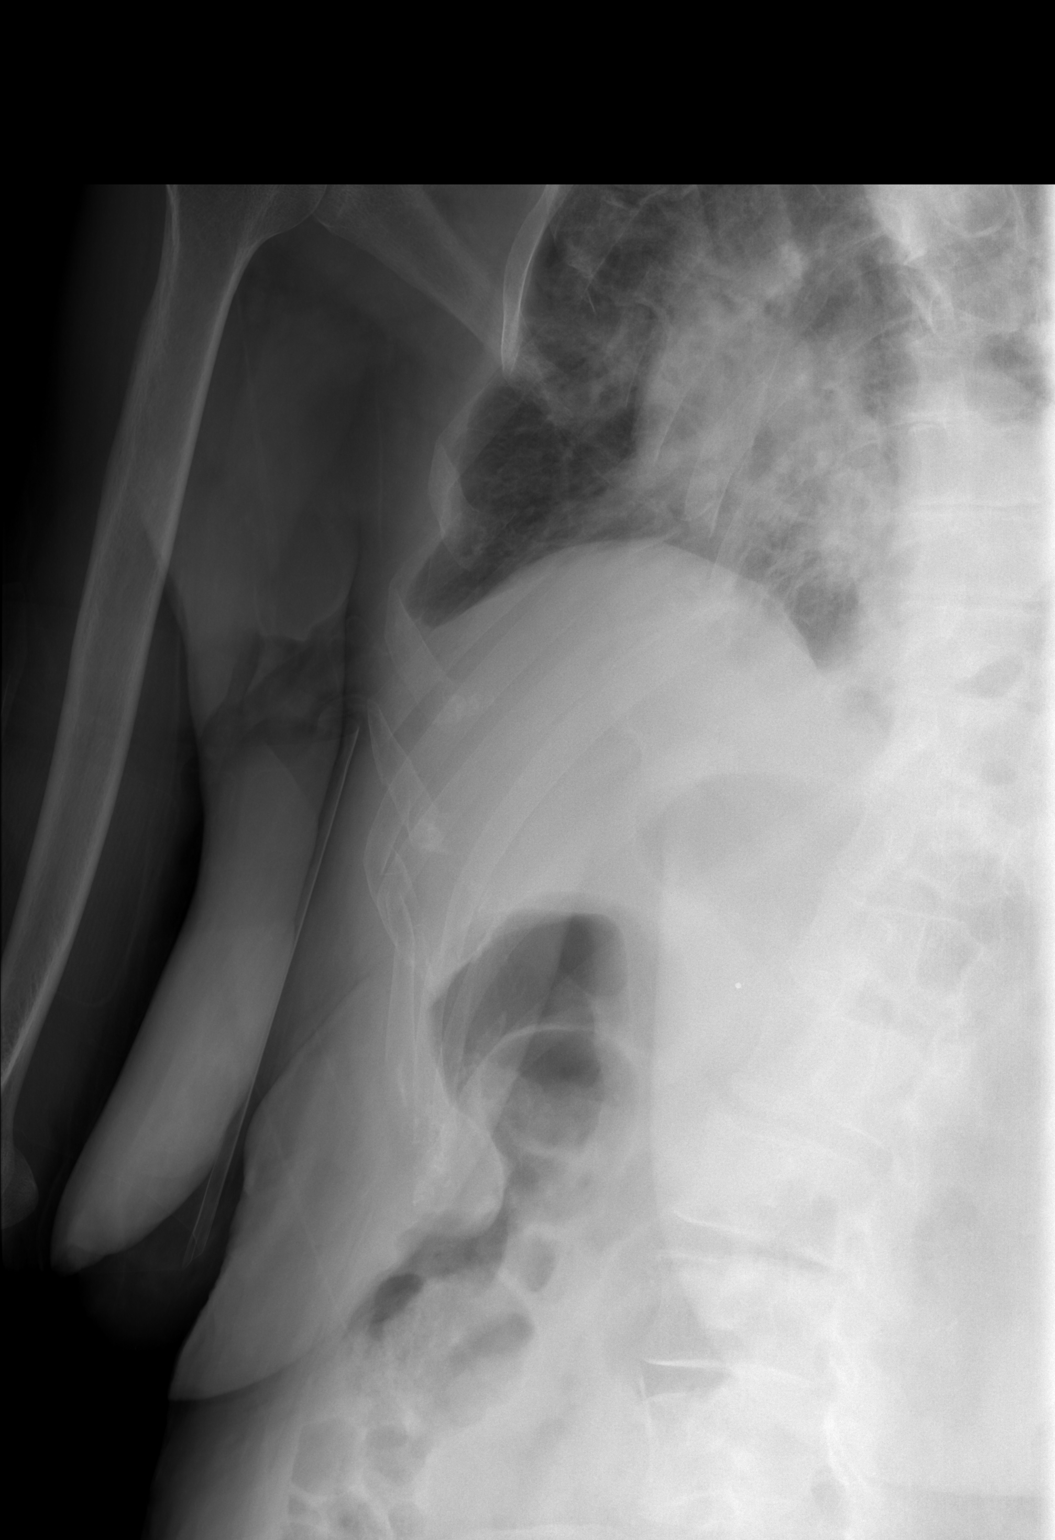

[3 of 3 positions shown; findings below may reference images not displayed]

FINDINGS: Multiple displaced right posterolateral rib fractures. No definite
pneumothorax. Patchy density in the right lung may reflect
atelectasis and/or contusion. Left lung is clear. Cardiomediastinal
contours are within normal limits.
IMPRESSION: Multiple displaced right posterolateral rib fractures. No definite
pneumothorax. Patchy right lung atelectasis and/or contusion.

## 2024-03-07 IMAGING — DX DG CHEST 1V PORT
1 series · 1 of 1 positions shown · non-contrast
Comparison: Chest CT from yesterday

CLINICAL DATA: Rib fractures.  Pneumothorax

EXAM:
PORTABLE CHEST 1 VIEW

[chest ap]
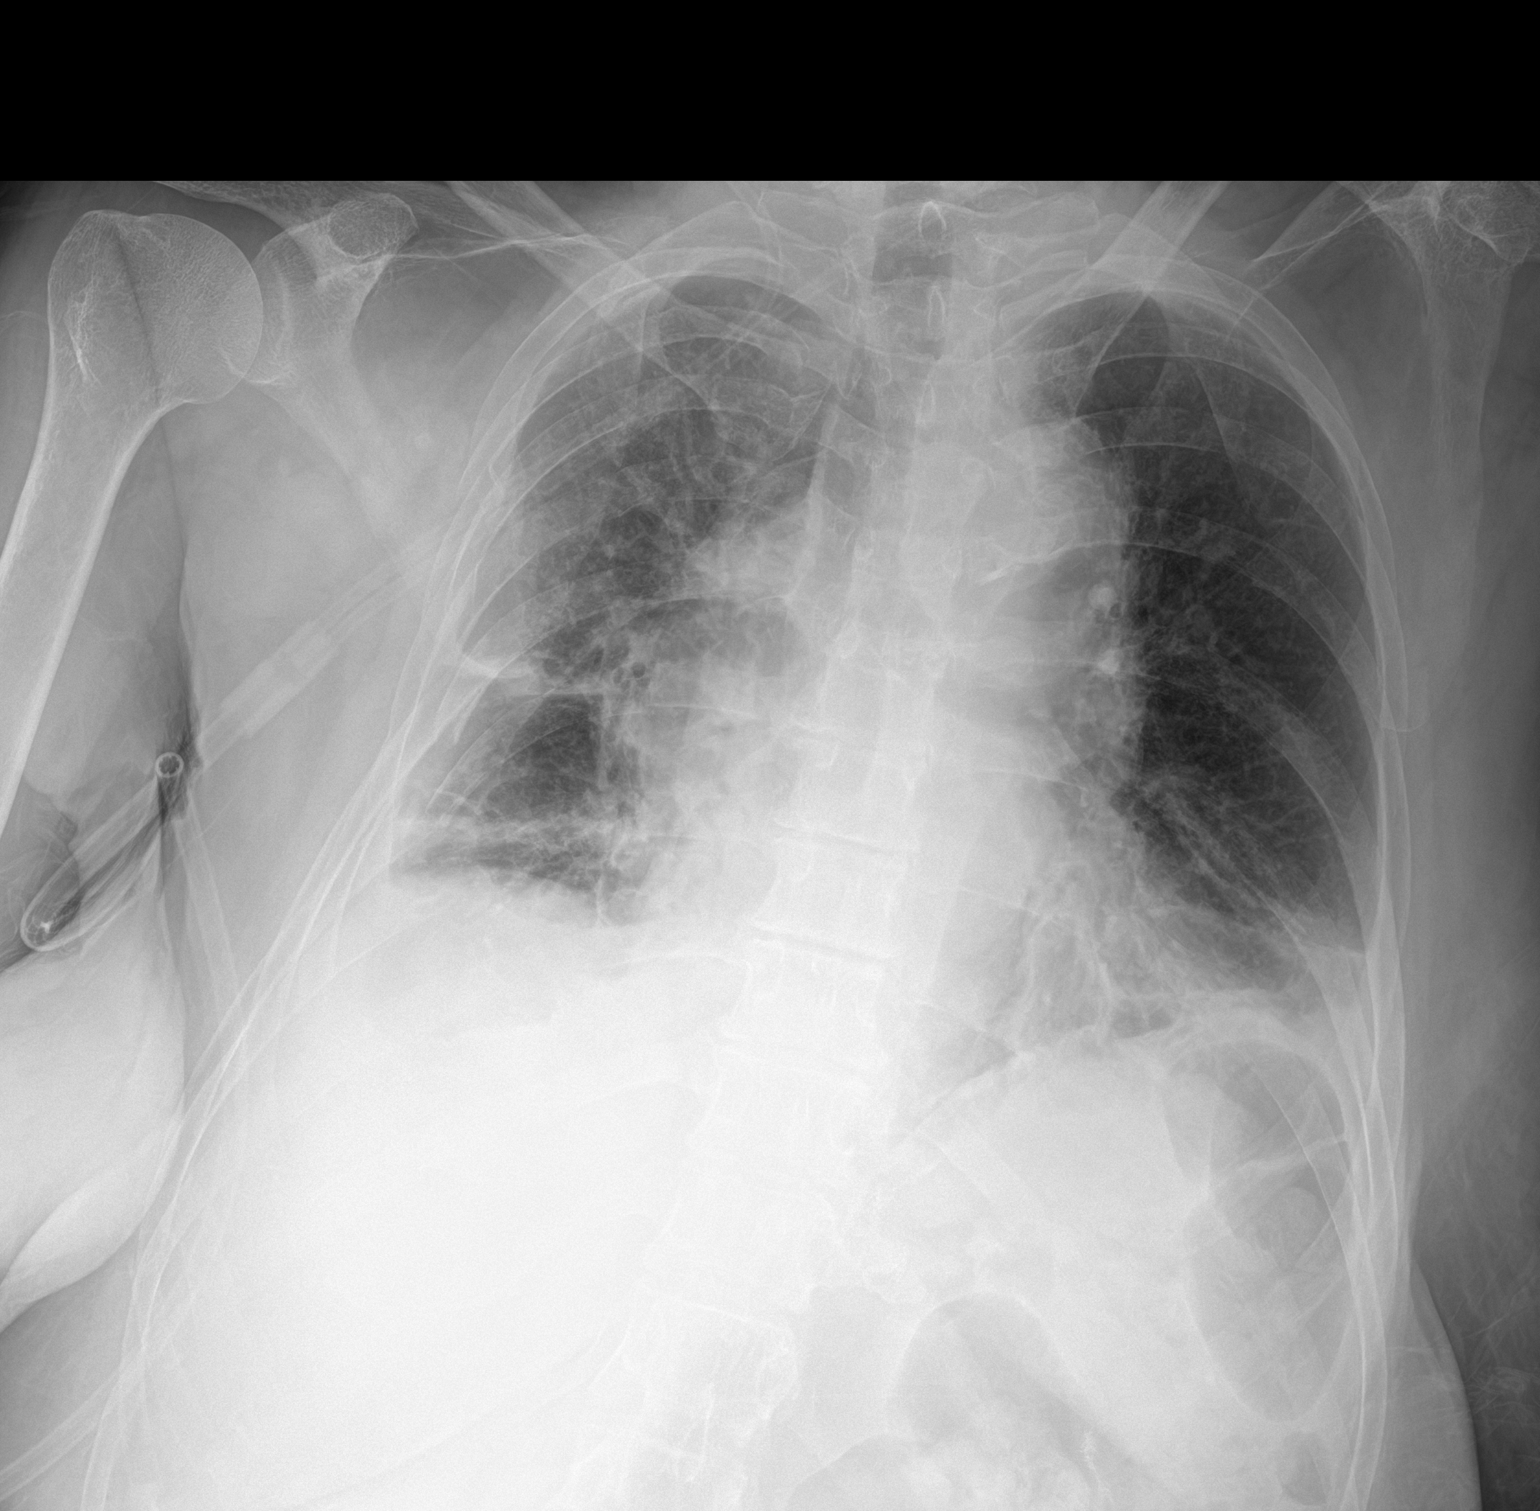

[1 of 1 positions shown; findings below may reference images not displayed]

FINDINGS: Trace right apical pneumothorax. Extensive known rib fractures on
the right. Increase in bandlike opacity at the bases. No large or
definite increase in pleural fluid. Normal heart size and stable
aortic tortuosity.
IMPRESSION: 1. Unchanged trace right apical pneumothorax.
2. Increased atelectasis.

## 2024-03-08 IMAGING — DX DG CHEST 1V PORT
1 series · 1 of 1 positions shown · non-contrast
Comparison: Chest x-ray dated October 16, 2021

CLINICAL DATA: Pneumothorax

EXAM:
PORTABLE CHEST 1 VIEW

[chest]
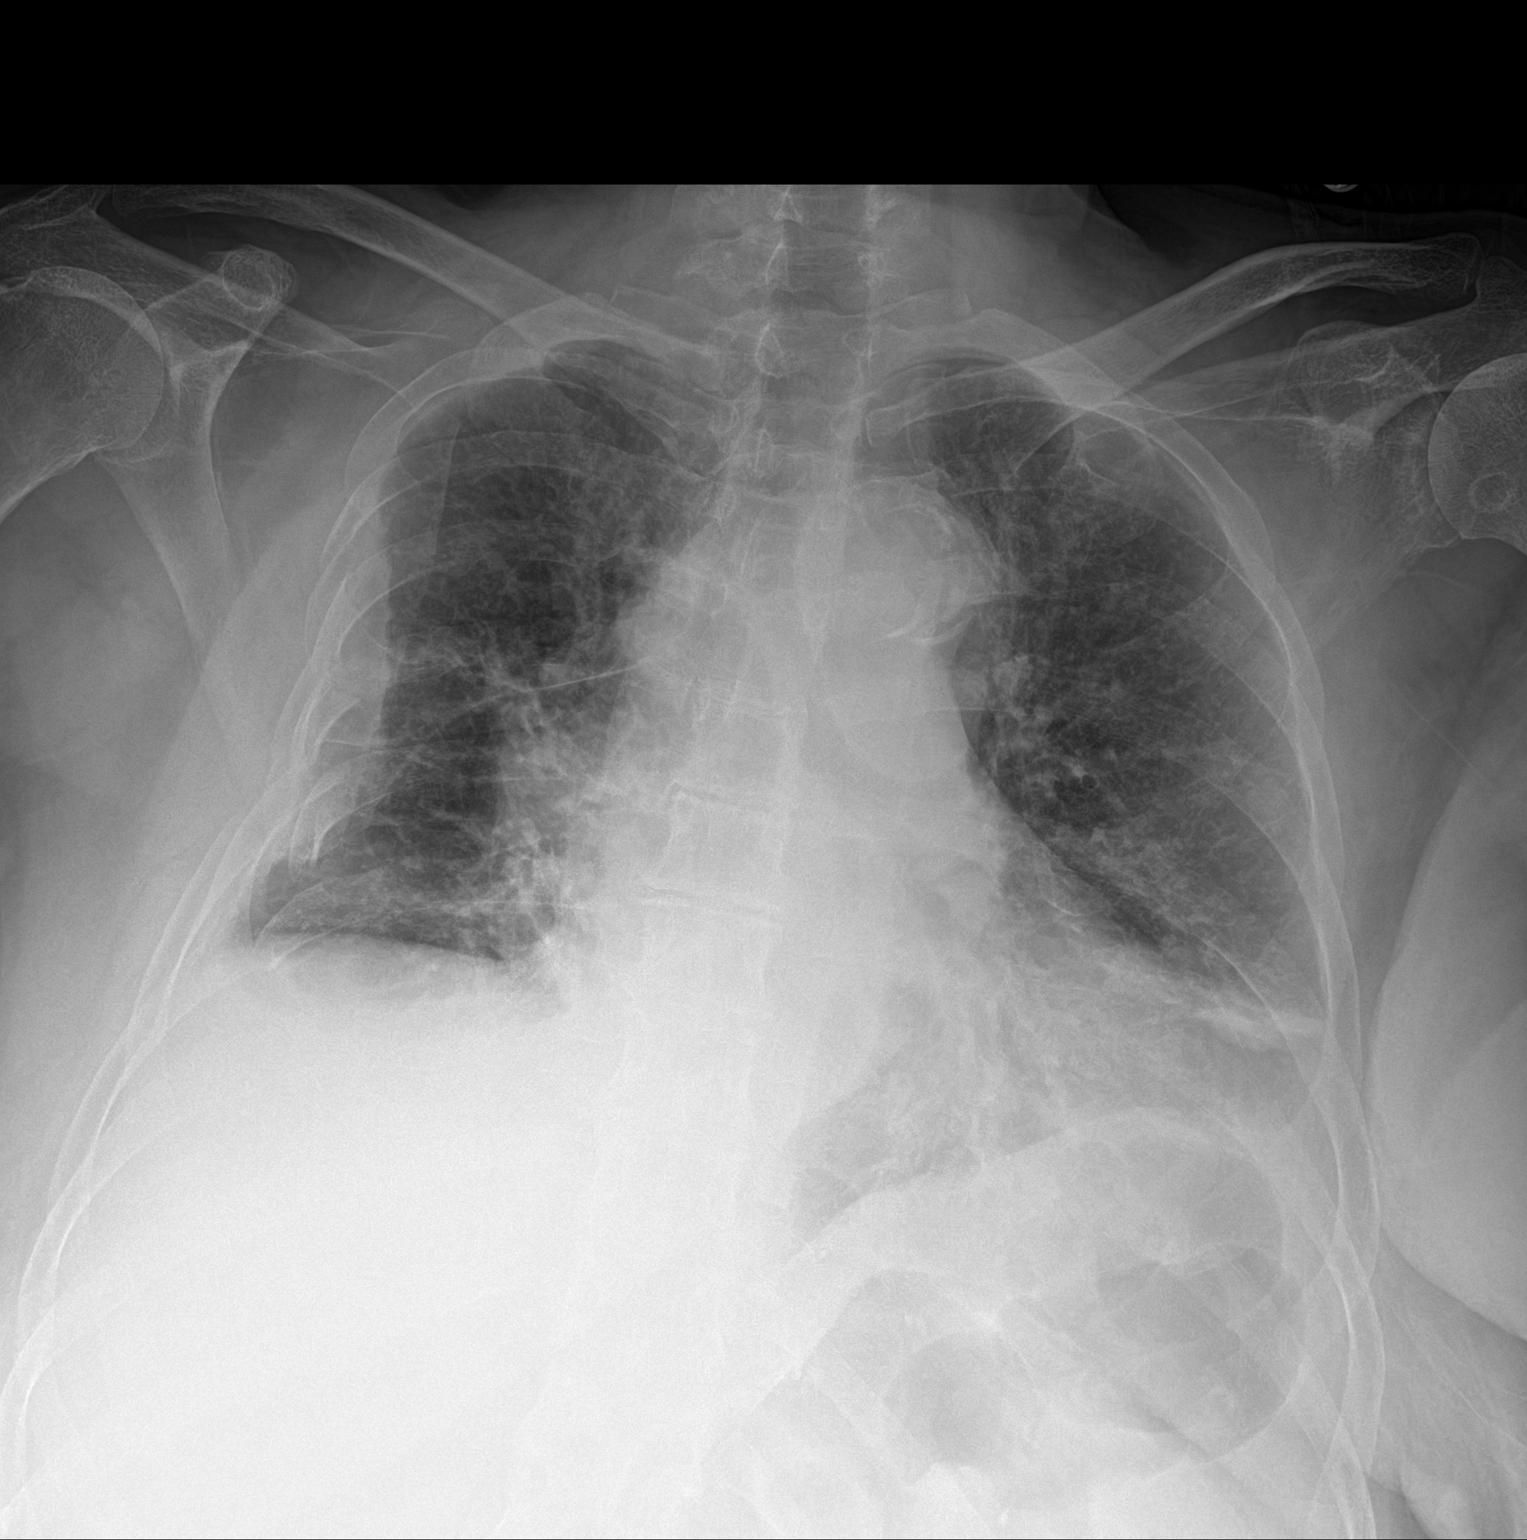

[1 of 1 positions shown; findings below may reference images not displayed]

FINDINGS: Cardiac and mediastinal contours are unchanged. Lower lung
predominant scattered linear opacities, unchanged when compared with
prior and likely due to atelectasis. No visible pneumothorax. No
pleural effusion. Multiple right-sided rib fractures with associated
pleural thickening.
IMPRESSION: No visible pneumothorax.

## 2024-03-31 ENCOUNTER — Ambulatory Visit: Admitting: Family Medicine

## 2024-03-31 ENCOUNTER — Encounter: Payer: Self-pay | Admitting: Family Medicine

## 2024-03-31 VITALS — BP 128/76 | HR 81 | Temp 98.0°F | Resp 16 | Ht 60.0 in | Wt 160.6 lb

## 2024-03-31 DIAGNOSIS — I10 Essential (primary) hypertension: Secondary | ICD-10-CM

## 2024-03-31 DIAGNOSIS — Z23 Encounter for immunization: Secondary | ICD-10-CM | POA: Diagnosis not present

## 2024-03-31 DIAGNOSIS — Z0000 Encounter for general adult medical examination without abnormal findings: Secondary | ICD-10-CM

## 2024-03-31 DIAGNOSIS — H9193 Unspecified hearing loss, bilateral: Secondary | ICD-10-CM | POA: Diagnosis not present

## 2024-03-31 DIAGNOSIS — M069 Rheumatoid arthritis, unspecified: Secondary | ICD-10-CM | POA: Diagnosis not present

## 2024-03-31 LAB — COMPREHENSIVE METABOLIC PANEL WITH GFR
ALT: 8 U/L (ref 0–35)
AST: 15 U/L (ref 0–37)
Albumin: 4 g/dL (ref 3.5–5.2)
Alkaline Phosphatase: 65 U/L (ref 39–117)
BUN: 14 mg/dL (ref 6–23)
CO2: 24 meq/L (ref 19–32)
Calcium: 10.3 mg/dL (ref 8.4–10.5)
Chloride: 106 meq/L (ref 96–112)
Creatinine, Ser: 0.59 mg/dL (ref 0.40–1.20)
GFR: 91.09 mL/min (ref >60.00)
Glucose, Bld: 94 mg/dL (ref 70–99)
Potassium: 4.3 meq/L (ref 3.5–5.1)
Sodium: 138 meq/L (ref 135–145)
Total Bilirubin: 0.5 mg/dL (ref 0.2–1.2)
Total Protein: 6.4 g/dL (ref 6.0–8.3)

## 2024-03-31 LAB — CBC
HCT: 41.1 % (ref 36.0–46.0)
Hemoglobin: 13.4 g/dL (ref 12.0–15.0)
MCHC: 32.7 g/dL (ref 30.0–36.0)
MCV: 83.2 fl (ref 78.0–100.0)
Platelets: 347 K/uL (ref 150.0–400.0)
RBC: 4.93 Mil/uL (ref 3.87–5.11)
RDW: 18.3 % — ABNORMAL HIGH (ref 11.5–15.5)
WBC: 4.7 K/uL (ref 4.0–10.5)

## 2024-03-31 LAB — LIPID PANEL
Cholesterol: 256 mg/dL — ABNORMAL HIGH (ref 0–200)
HDL: 107.6 mg/dL (ref >39.00)
LDL Cholesterol: 130 mg/dL — ABNORMAL HIGH (ref 0–99)
NonHDL: 148.41
Total CHOL/HDL Ratio: 2
Triglycerides: 91 mg/dL (ref 0.0–149.0)
VLDL: 18.2 mg/dL (ref 0.0–40.0)

## 2024-03-31 NOTE — Patient Instructions (Addendum)
 Give [place] 2-3 business days to get the results of your labs back.   Keep the diet clean and stay active.  Aim to do some physical exertion for 150 minutes per week. This is typically divided into 5 days per week, 30 minutes per day. The activity should be enough to get your heart rate up. Anything is better than nothing if you have time constraints.  Please get me a copy of your advanced directive form at your convenience.   If you do not hear anything about your referral in the next 1-2 weeks, call our office and ask for an update.  Let us know if you need anything.

## 2024-03-31 NOTE — Progress Notes (Signed)
 Chief Complaint  Patient presents with   Annual Exam    CPE     Well Woman Linda Wood is here for a complete physical.   Her last physical was >1 year ago.  Current diet: in general, a healthy diet. Current exercise: none. Weight is stable and she denies daytime fatigue. Seatbelt? Yes Advanced directive? Yes  Health Maintenance Colonoscopy- Yes Shingrix- Yes DEXA- Yes Mammogram- Yes Tetanus- Yes Pneumonia- Yes Hep C screen- Yes  Past Medical History:  Diagnosis Date   Anxiety    Breast cancer (HCC)    Depression    Hypertension    Rheumatoid arthritis (HCC)      Past Surgical History:  Procedure Laterality Date   BREAST BIOPSY Left 06/20/2022   US  LT BREAST BX W LOC DEV 1ST LESION IMG BX SPEC US  GUIDE 06/20/2022 [facility]   BREAST BIOPSY Left 06/20/2022   MM LT BREAST BX W LOC DEV 1ST LESION IMAGE BX SPEC STEREO GUIDE 06/20/2022 [facility]   BREAST BIOPSY  07/15/2022   MM LT RADIOACTIVE SEED LOC MAMMO GUIDE 07/15/2022 [facility]   BREAST BIOPSY  07/15/2022   MM LT RADIOACTIVE SEED EA ADD LESION LOC MAMMO GUIDE 07/15/2022 [facility]   BREAST LUMPECTOMY WITH RADIOACTIVE SEED AND SENTINEL LYMPH NODE BIOPSY Left 07/16/2022   Procedure: LEFT BREAST LUMPECTOMY WITH RADIOACTIVE SEED X2 AND AXILLARY SENTINEL LYMPH NODE BIOPSY;  Surgeon: Belinda Cough, MD;  Location: [facility];  Service: General;  Laterality: Left;   CATARACT EXTRACTION, BILATERAL     08/14/2022, 08/28/2022   GASTRIC BYPASS     HAND SURGERY Right    LASIK     RE-EXCISION OF BREAST LUMPECTOMY Left 07/24/2022   Procedure: RE-EXCISION OF LEFT BREAST LUMPECTOMY ANTERIOR AND LATERAL MARGINS;  Surgeon: Belinda Cough, MD;  Location: [facility];  Service: General;  Laterality: Left;  45 MIN ROOM 12    Medications  Current Outpatient Medications on File Prior to Visit  Medication Sig Dispense Refill   anastrozole  (ARIMIDEX ) 1 MG tablet Take 1 tablet (1 mg total) by mouth daily. 90  tablet 3   diphenhydrAMINE (BENADRYL) 25 MG tablet Take 25-50 mg by mouth at bedtime as needed for allergies.     ergocalciferol  (VITAMIN D2) 1.25 MG (50000 UT) capsule Take 50,000 Units by mouth every Saturday.     hydroxychloroquine  (PLAQUENIL ) 200 MG tablet Take 1 tablet (200 mg total) by mouth daily. 90 tablet 0   loperamide (IMODIUM) 2 MG capsule Take 4 mg by mouth as needed for diarrhea or loose stools.     losartan  (COZAAR ) 50 MG tablet Take 1 tablet (50 mg total) by mouth in the morning. 90 tablet 1   Multiple Vitamin (MULTIVITAMIN WITH MINERALS) TABS tablet Take 2 tablets by mouth daily.     traZODone  (DESYREL ) 50 MG tablet Take 0.5-1 tablets (25-50 mg total) by mouth at bedtime as needed for sleep. 90 tablet 1   valACYclovir  (VALTREX ) 1000 MG tablet Take 2 tablets by mouth and repeat in 12 hours in event of an outbreak. (Patient not taking: Reported on 02/18/2024) 30 tablet 1   Allergies Allergies  Allergen Reactions   Dextromethorphan Anaphylaxis   Dextromethorphan-Guaifenesin Other (See Comments)   Promethazine     Dizziness     Review of Systems: Constitutional:  no fevers Eye:  no recent significant change in vision Ears:  +decreased hearing Nose/Mouth/Throat:  no complaints of nasal congestion, no sore throat Cardiovascular: no chest pain Respiratory:  No shortness  of breath Gastrointestinal:  No change in bowel habits GU:  Female: negative for dysuria Integumentary:  no abnormal skin lesions reported Neurologic:  no headaches Endocrine:  denies unexplained weight changes  Exam BP 128/76 (BP Location: Left Arm, Patient Position: Sitting)   Pulse 81   Temp 98 F (36.7 C) (Oral)   Resp 16   Ht 5' (1.524 m)   Wt 160 lb 9.6 oz (72.8 kg)   SpO2 95%   BMI 31.37 kg/m  General:  well developed, well nourished, in no apparent distress Skin:  no significant moles, warts, or growths Head:  no masses, lesions, or tenderness Eyes:  pupils equal and round, sclera  anicteric without injection Ears:  canals without lesions, TMs shiny without retraction, no obvious effusion, no erythema Nose:  nares patent, mucosa normal, and no drainage Throat/Pharynx:  lips and gingiva without lesion; tongue and uvula midline; non-inflamed pharynx; no exudates or postnasal drainage Neck: neck supple without adenopathy, thyromegaly, or masses Lungs:  clear to auscultation, breath sounds equal bilaterally, no respiratory distress Cardio:  regular rate and rhythm, no bruits or LE edema Abdomen:  abdomen soft, nontender; bowel sounds normal; no masses or organomegaly Genital: Deferred Neuro:  gait normal; deep tendon reflexes normal and symmetric Psych: well oriented with normal range of affect and appropriate judgment/insight  Assessment and Plan  Well adult exam  Essential hypertension - Plan: CBC, Comprehensive metabolic panel with GFR, Lipid panel  Rheumatoid arthritis of other site, unspecified whether rheumatoid factor present (HCC)  Bilateral hearing loss, unspecified hearing loss type - Plan: Ambulatory referral to Audiology   Well 68 y.o. female. Counseled on diet and exercise. Advanced directive form provided today.  Flu shot today. Refer audiology for hearing eval.   Other orders as above. Follow up in 6 months. The patient voiced understanding and agreement to the plan.  Mabel [place], DO 03/31/24 10:47 AM

## 2024-03-31 NOTE — Addendum Note (Signed)
 Addended by: Acquanetta Cabanilla M on: 03/31/2024 10:55 AM   Modules accepted: Orders

## 2024-04-01 ENCOUNTER — Ambulatory Visit: Payer: Self-pay | Admitting: Family Medicine

## 2024-05-21 ENCOUNTER — Ambulatory Visit
Admission: RE | Admit: 2024-05-21 | Discharge: 2024-05-21 | Disposition: A | Source: Ambulatory Visit | Attending: Hematology and Oncology

## 2024-05-21 DIAGNOSIS — C50412 Malignant neoplasm of upper-outer quadrant of left female breast: Secondary | ICD-10-CM

## 2024-05-24 ENCOUNTER — Ambulatory Visit

## 2024-05-31 ENCOUNTER — Ambulatory Visit

## 2024-06-08 ENCOUNTER — Ambulatory Visit

## 2024-07-20 ENCOUNTER — Ambulatory Visit: Admitting: Physician Assistant

## 2024-09-29 ENCOUNTER — Ambulatory Visit: Admitting: Family Medicine

## 2025-01-11 ENCOUNTER — Ambulatory Visit: Admitting: Hematology and Oncology
